# Patient Record
Sex: Female | Born: 1965 | Race: White | Hispanic: No | Smoking: Current some day smoker
Health system: Southern US, Community
[De-identification: ages and names within clinical notes are randomized; demographics above are authoritative.]

## PROBLEM LIST (undated history)

## (undated) ENCOUNTER — Telehealth

## (undated) ENCOUNTER — Encounter: Attending: Gastroenterology | Primary: Gastroenterology

## (undated) ENCOUNTER — Encounter

## (undated) ENCOUNTER — Ambulatory Visit

## (undated) ENCOUNTER — Encounter
Attending: Pharmacist Clinician (PhC)/ Clinical Pharmacy Specialist | Primary: Pharmacist Clinician (PhC)/ Clinical Pharmacy Specialist

## (undated) ENCOUNTER — Telehealth: Attending: Family | Primary: Family

## (undated) ENCOUNTER — Encounter: Attending: Gerontology | Primary: Gerontology

## (undated) ENCOUNTER — Ambulatory Visit: Payer: MEDICARE

## (undated) ENCOUNTER — Ambulatory Visit: Payer: MEDICARE | Attending: Family | Primary: Family

## (undated) ENCOUNTER — Encounter: Attending: Family | Primary: Family

## (undated) ENCOUNTER — Ambulatory Visit
Attending: Pharmacist Clinician (PhC)/ Clinical Pharmacy Specialist | Primary: Pharmacist Clinician (PhC)/ Clinical Pharmacy Specialist

## (undated) ENCOUNTER — Ambulatory Visit: Payer: MEDICARE | Attending: Gastroenterology | Primary: Gastroenterology

## (undated) ENCOUNTER — Telehealth
Attending: Pharmacist Clinician (PhC)/ Clinical Pharmacy Specialist | Primary: Pharmacist Clinician (PhC)/ Clinical Pharmacy Specialist

## (undated) DIAGNOSIS — F259 Schizoaffective disorder, unspecified: Secondary | ICD-10-CM

## (undated) DIAGNOSIS — M549 Dorsalgia, unspecified: Secondary | ICD-10-CM

## (undated) DIAGNOSIS — R51 Headache: Secondary | ICD-10-CM

## (undated) DIAGNOSIS — G8929 Other chronic pain: Secondary | ICD-10-CM

## (undated) DIAGNOSIS — F4310 Post-traumatic stress disorder, unspecified: Secondary | ICD-10-CM

## (undated) DIAGNOSIS — S22000A Wedge compression fracture of unspecified thoracic vertebra, initial encounter for closed fracture: Secondary | ICD-10-CM

## (undated) DIAGNOSIS — S92301A Fracture of unspecified metatarsal bone(s), right foot, initial encounter for closed fracture: Secondary | ICD-10-CM

## (undated) DIAGNOSIS — M1990 Unspecified osteoarthritis, unspecified site: Secondary | ICD-10-CM

## (undated) DIAGNOSIS — R519 Headache, unspecified: Secondary | ICD-10-CM

## (undated) DIAGNOSIS — F319 Bipolar disorder, unspecified: Secondary | ICD-10-CM

## (undated) DIAGNOSIS — K219 Gastro-esophageal reflux disease without esophagitis: Secondary | ICD-10-CM

## (undated) DIAGNOSIS — S92009A Unspecified fracture of unspecified calcaneus, initial encounter for closed fracture: Secondary | ICD-10-CM

## (undated) DIAGNOSIS — K759 Inflammatory liver disease, unspecified: Secondary | ICD-10-CM

## (undated) DIAGNOSIS — S32000A Wedge compression fracture of unspecified lumbar vertebra, initial encounter for closed fracture: Secondary | ICD-10-CM

## (undated) HISTORY — PX: HEMORRHOID SURGERY: SHX153

## (undated) HISTORY — PX: COLONOSCOPY: SHX174

## (undated) HISTORY — PX: TUBAL LIGATION: SHX77

---
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
## 2002-10-31 ENCOUNTER — Emergency Department (HOSPITAL_COMMUNITY): Admission: EM | Admit: 2002-10-31 | Discharge: 2002-10-31 | Payer: Self-pay | Admitting: Internal Medicine

## 2003-01-21 ENCOUNTER — Emergency Department (HOSPITAL_COMMUNITY): Admission: EM | Admit: 2003-01-21 | Discharge: 2003-01-22 | Payer: Self-pay | Admitting: Emergency Medicine

## 2003-01-21 ENCOUNTER — Encounter: Payer: Self-pay | Admitting: Emergency Medicine

## 2011-02-01 DIAGNOSIS — R079 Chest pain, unspecified: Secondary | ICD-10-CM

## 2012-08-16 ENCOUNTER — Emergency Department (HOSPITAL_COMMUNITY): Payer: Medicare Other

## 2012-08-16 ENCOUNTER — Emergency Department (HOSPITAL_COMMUNITY)
Admission: EM | Admit: 2012-08-16 | Discharge: 2012-08-16 | Disposition: A | Discharge status: Home / Self Care | Payer: Medicare Other | Attending: Emergency Medicine | Admitting: Emergency Medicine

## 2012-08-16 ENCOUNTER — Encounter (HOSPITAL_COMMUNITY): Payer: Self-pay | Admitting: *Deleted

## 2012-08-16 DIAGNOSIS — F319 Bipolar disorder, unspecified: Secondary | ICD-10-CM | POA: Insufficient documentation

## 2012-08-16 DIAGNOSIS — M542 Cervicalgia: Secondary | ICD-10-CM | POA: Insufficient documentation

## 2012-08-16 DIAGNOSIS — Z79899 Other long term (current) drug therapy: Secondary | ICD-10-CM | POA: Insufficient documentation

## 2012-08-16 DIAGNOSIS — T07XXXA Unspecified multiple injuries, initial encounter: Secondary | ICD-10-CM | POA: Insufficient documentation

## 2012-08-16 DIAGNOSIS — Z23 Encounter for immunization: Secondary | ICD-10-CM | POA: Insufficient documentation

## 2012-08-16 DIAGNOSIS — M25469 Effusion, unspecified knee: Secondary | ICD-10-CM | POA: Diagnosis not present

## 2012-08-16 DIAGNOSIS — M79609 Pain in unspecified limb: Secondary | ICD-10-CM | POA: Diagnosis not present

## 2012-08-16 DIAGNOSIS — R109 Unspecified abdominal pain: Secondary | ICD-10-CM | POA: Diagnosis not present

## 2012-08-16 DIAGNOSIS — M549 Dorsalgia, unspecified: Secondary | ICD-10-CM | POA: Diagnosis not present

## 2012-08-16 DIAGNOSIS — R51 Headache: Secondary | ICD-10-CM | POA: Diagnosis not present

## 2012-08-16 DIAGNOSIS — H1130 Conjunctival hemorrhage, unspecified eye: Secondary | ICD-10-CM | POA: Insufficient documentation

## 2012-08-16 DIAGNOSIS — N390 Urinary tract infection, site not specified: Secondary | ICD-10-CM | POA: Diagnosis not present

## 2012-08-16 DIAGNOSIS — Z043 Encounter for examination and observation following other accident: Secondary | ICD-10-CM | POA: Diagnosis not present

## 2012-08-16 DIAGNOSIS — S022XXA Fracture of nasal bones, initial encounter for closed fracture: Secondary | ICD-10-CM | POA: Insufficient documentation

## 2012-08-16 DIAGNOSIS — M25569 Pain in unspecified knee: Secondary | ICD-10-CM | POA: Insufficient documentation

## 2012-08-16 DIAGNOSIS — M25529 Pain in unspecified elbow: Secondary | ICD-10-CM | POA: Insufficient documentation

## 2012-08-16 HISTORY — DX: Bipolar disorder, unspecified: F31.9

## 2012-08-16 LAB — URINALYSIS, ROUTINE W REFLEX MICROSCOPIC
Glucose, UA: NEGATIVE mg/dL
Protein, ur: NEGATIVE mg/dL

## 2012-08-16 LAB — COMPREHENSIVE METABOLIC PANEL
ALT: 16 U/L (ref 0–35)
AST: 32 U/L (ref 0–37)
Alkaline Phosphatase: 62 U/L (ref 39–117)
CO2: 22 mEq/L (ref 19–32)
Calcium: 8.9 mg/dL (ref 8.4–10.5)
Chloride: 104 mEq/L (ref 96–112)
GFR calc non Af Amer: 69 mL/min — ABNORMAL LOW (ref >90)
Potassium: 3.4 mEq/L — ABNORMAL LOW (ref 3.5–5.1)
Sodium: 134 mEq/L — ABNORMAL LOW (ref 135–145)

## 2012-08-16 LAB — URINE MICROSCOPIC-ADD ON

## 2012-08-16 LAB — CBC WITH DIFFERENTIAL/PLATELET
Basophils Absolute: 0 10*3/uL (ref 0.0–0.1)
Eosinophils Relative: 1 % (ref 0–5)
Lymphocytes Relative: 20 % (ref 12–46)
Neutro Abs: 6.7 10*3/uL (ref 1.7–7.7)
Neutrophils Relative %: 67 % (ref 43–77)
Platelets: 245 10*3/uL (ref 150–400)
RBC: 4.33 MIL/uL (ref 3.87–5.11)
RDW: 13.7 % (ref 11.5–15.5)
WBC: 10 10*3/uL (ref 4.0–10.5)

## 2012-08-16 MED ORDER — HYDROCODONE-ACETAMINOPHEN 5-325 MG PO TABS
2.0000 | ORAL_TABLET | Freq: Once | ORAL | Status: AC
  Administered 2012-08-16: 2 via ORAL
  Filled 2012-08-16: qty 2

## 2012-08-16 MED ORDER — OXYCODONE HCL 5 MG PO TABS
5.0000 mg | ORAL_TABLET | Freq: Once | ORAL | Status: AC
  Administered 2012-08-16: 5 mg via ORAL
  Filled 2012-08-16: qty 1

## 2012-08-16 MED ORDER — TETANUS-DIPHTH-ACELL PERTUSSIS 5-2.5-18.5 LF-MCG/0.5 IM SUSP
0.5000 mL | Freq: Once | INTRAMUSCULAR | Status: AC
  Administered 2012-08-16: 0.5 mL via INTRAMUSCULAR
  Filled 2012-08-16: qty 0.5

## 2012-08-16 MED ORDER — IBUPROFEN 800 MG PO TABS: 800.0000 mg | ORAL_TABLET | Freq: Three times a day (TID) | ORAL | Status: AC

## 2012-08-16 MED ORDER — HYDROCODONE-ACETAMINOPHEN 5-325 MG PO TABS: 2.0000 | ORAL_TABLET | ORAL | Status: AC | PRN

## 2012-08-16 MED ORDER — IOHEXOL 300 MG/ML  SOLN
100.0000 mL | Freq: Once | INTRAMUSCULAR | Status: AC | PRN
  Administered 2012-08-16: 100 mL via INTRAVENOUS

## 2012-08-16 MED ORDER — CEPHALEXIN 500 MG PO CAPS: 500.0000 mg | ORAL_CAPSULE | Freq: Four times a day (QID) | ORAL | Status: AC

## 2012-08-16 NOTE — ED Notes (Signed)
Pt unable to make out any letters on the eye chart. Pt states she wears corrective lenses & is unable to make out anything without them. EDP notified.

## 2012-08-16 NOTE — ED Notes (Signed)
Pt alert & oriented x4. Patient given discharge instructions, paperwork & prescription(s). Patient Parent verbalized understanding. Pt left department w/ no further questions.

## 2012-08-16 NOTE — ED Notes (Signed)
RN at bedside
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
## 2012-08-21 ENCOUNTER — Ambulatory Visit (INDEPENDENT_AMBULATORY_CARE_PROVIDER_SITE_OTHER): Payer: Medicare Other | Admitting: Otolaryngology

## 2012-08-21 DIAGNOSIS — S022XXA Fracture of nasal bones, initial encounter for closed fracture: Secondary | ICD-10-CM

## 2012-08-21 DIAGNOSIS — H903 Sensorineural hearing loss, bilateral: Secondary | ICD-10-CM | POA: Diagnosis not present

## 2012-09-22 DIAGNOSIS — F3163 Bipolar disorder, current episode mixed, severe, without psychotic features: Secondary | ICD-10-CM | POA: Diagnosis not present

## 2012-10-02 DIAGNOSIS — Z23 Encounter for immunization: Secondary | ICD-10-CM | POA: Diagnosis not present

## 2012-12-08 DIAGNOSIS — F3163 Bipolar disorder, current episode mixed, severe, without psychotic features: Secondary | ICD-10-CM | POA: Diagnosis not present

## 2012-12-26 DIAGNOSIS — J111 Influenza due to unidentified influenza virus with other respiratory manifestations: Secondary | ICD-10-CM | POA: Diagnosis not present

## 2012-12-26 DIAGNOSIS — F17200 Nicotine dependence, unspecified, uncomplicated: Secondary | ICD-10-CM | POA: Diagnosis not present

## 2012-12-26 DIAGNOSIS — Z79899 Other long term (current) drug therapy: Secondary | ICD-10-CM | POA: Diagnosis not present

## 2013-04-02 DIAGNOSIS — F3163 Bipolar disorder, current episode mixed, severe, without psychotic features: Secondary | ICD-10-CM | POA: Diagnosis not present

## 2013-04-07 DIAGNOSIS — Z113 Encounter for screening for infections with a predominantly sexual mode of transmission: Secondary | ICD-10-CM | POA: Diagnosis not present

## 2013-04-07 DIAGNOSIS — A6000 Herpesviral infection of urogenital system, unspecified: Secondary | ICD-10-CM | POA: Diagnosis not present

## 2013-04-07 DIAGNOSIS — R21 Rash and other nonspecific skin eruption: Secondary | ICD-10-CM | POA: Diagnosis not present

## 2013-04-07 DIAGNOSIS — N949 Unspecified condition associated with female genital organs and menstrual cycle: Secondary | ICD-10-CM | POA: Diagnosis not present

## 2013-05-21 DIAGNOSIS — J329 Chronic sinusitis, unspecified: Secondary | ICD-10-CM | POA: Diagnosis not present

## 2013-05-21 DIAGNOSIS — J209 Acute bronchitis, unspecified: Secondary | ICD-10-CM | POA: Diagnosis not present

## 2013-05-21 DIAGNOSIS — Z79899 Other long term (current) drug therapy: Secondary | ICD-10-CM | POA: Diagnosis not present

## 2013-05-21 DIAGNOSIS — J40 Bronchitis, not specified as acute or chronic: Secondary | ICD-10-CM | POA: Diagnosis not present

## 2013-05-21 DIAGNOSIS — J019 Acute sinusitis, unspecified: Secondary | ICD-10-CM | POA: Diagnosis not present

## 2013-05-21 DIAGNOSIS — F17200 Nicotine dependence, unspecified, uncomplicated: Secondary | ICD-10-CM | POA: Diagnosis not present

## 2013-05-21 DIAGNOSIS — J069 Acute upper respiratory infection, unspecified: Secondary | ICD-10-CM | POA: Diagnosis not present

## 2013-06-17 DIAGNOSIS — F3163 Bipolar disorder, current episode mixed, severe, without psychotic features: Secondary | ICD-10-CM | POA: Diagnosis not present

## 2013-10-06 DIAGNOSIS — Z23 Encounter for immunization: Secondary | ICD-10-CM | POA: Diagnosis not present

## 2013-10-06 DIAGNOSIS — F319 Bipolar disorder, unspecified: Secondary | ICD-10-CM | POA: Diagnosis not present

## 2013-10-20 DIAGNOSIS — Z23 Encounter for immunization: Secondary | ICD-10-CM | POA: Diagnosis not present

## 2013-10-30 ENCOUNTER — Encounter (HOSPITAL_COMMUNITY): Payer: Self-pay | Admitting: Emergency Medicine

## 2013-10-30 ENCOUNTER — Emergency Department (HOSPITAL_COMMUNITY)
Admission: EM | Admit: 2013-10-30 | Discharge: 2013-10-30 | Disposition: A | Discharge status: Home / Self Care | Payer: Medicare Other | Attending: Emergency Medicine | Admitting: Emergency Medicine

## 2013-10-30 ENCOUNTER — Emergency Department (HOSPITAL_COMMUNITY): Payer: Medicare Other

## 2013-10-30 DIAGNOSIS — K2970 Gastritis, unspecified, without bleeding: Secondary | ICD-10-CM | POA: Diagnosis not present

## 2013-10-30 DIAGNOSIS — Z79899 Other long term (current) drug therapy: Secondary | ICD-10-CM | POA: Insufficient documentation

## 2013-10-30 DIAGNOSIS — R209 Unspecified disturbances of skin sensation: Secondary | ICD-10-CM | POA: Diagnosis not present

## 2013-10-30 DIAGNOSIS — Z87891 Personal history of nicotine dependence: Secondary | ICD-10-CM | POA: Diagnosis not present

## 2013-10-30 DIAGNOSIS — M545 Low back pain, unspecified: Secondary | ICD-10-CM | POA: Insufficient documentation

## 2013-10-30 DIAGNOSIS — F319 Bipolar disorder, unspecified: Secondary | ICD-10-CM | POA: Insufficient documentation

## 2013-10-30 DIAGNOSIS — F259 Schizoaffective disorder, unspecified: Secondary | ICD-10-CM | POA: Insufficient documentation

## 2013-10-30 DIAGNOSIS — G8929 Other chronic pain: Secondary | ICD-10-CM | POA: Diagnosis not present

## 2013-10-30 DIAGNOSIS — R10819 Abdominal tenderness, unspecified site: Secondary | ICD-10-CM | POA: Diagnosis not present

## 2013-10-30 DIAGNOSIS — M549 Dorsalgia, unspecified: Secondary | ICD-10-CM

## 2013-10-30 DIAGNOSIS — M47817 Spondylosis without myelopathy or radiculopathy, lumbosacral region: Secondary | ICD-10-CM | POA: Diagnosis not present

## 2013-10-30 HISTORY — DX: Schizoaffective disorder, unspecified: F25.9

## 2013-10-30 HISTORY — DX: Post-traumatic stress disorder, unspecified: F43.10

## 2013-10-30 LAB — URINALYSIS, ROUTINE W REFLEX MICROSCOPIC
Ketones, ur: NEGATIVE mg/dL
Leukocytes, UA: NEGATIVE
Nitrite: NEGATIVE
Protein, ur: NEGATIVE mg/dL
pH: 7 (ref 5.0–8.0)

## 2013-10-30 MED ORDER — METHYLPREDNISOLONE SODIUM SUCC 125 MG IJ SOLR
125.0000 mg | Freq: Once | INTRAMUSCULAR | Status: AC
  Administered 2013-10-30: 125 mg via INTRAVENOUS
  Filled 2013-10-30: qty 2

## 2013-10-30 MED ORDER — ONDANSETRON HCL 4 MG/2ML IJ SOLN
4.0000 mg | Freq: Once | INTRAMUSCULAR | Status: AC
  Administered 2013-10-30: 4 mg via INTRAVENOUS
  Filled 2013-10-30: qty 2

## 2013-10-30 MED ORDER — PREDNISONE 20 MG PO TABS: ORAL_TABLET | ORAL | Status: DC

## 2013-10-30 MED ORDER — HYDROMORPHONE HCL PF 1 MG/ML IJ SOLN
1.0000 mg | Freq: Once | INTRAMUSCULAR | Status: AC
  Administered 2013-10-30: 1 mg via INTRAVENOUS
  Filled 2013-10-30: qty 1

## 2013-10-30 MED ORDER — HYDROCODONE-ACETAMINOPHEN 5-325 MG PO TABS: 1.0000 | ORAL_TABLET | Freq: Four times a day (QID) | ORAL | Status: DC | PRN

## 2013-10-30 NOTE — ED Notes (Signed)
Pt complain of low back pain and leg pain for a year. States she is having a hard time walking

## 2013-10-30 NOTE — ED Provider Notes (Signed)
CSN: [id number]     Arrival date & time 10/30/13  1232 History  This chart was scribed for Benny Lennert, MD by Dorothey Baseman, ED Scribe. This patient was seen in room APA08/[facility]08 and the patient's care was started at 3:30 PM.    Chief Complaint  Patient presents with  . Back Pain   Patient is a 47 y.o. female presenting with back pain. The history is provided by the patient. No language interpreter was used.  Back Pain Location:  Lumbar spine Radiates to:  L posterior upper leg, L thigh and L knee Pain severity:  Moderate Timing:  Intermittent Chronicity:  Chronic Relieved by:  None tried Worsened by:  Lying down and movement Ineffective treatments:  None tried Associated symptoms: tingling   Associated symptoms: no abdominal pain, no chest pain and no headaches    HPI Comments: Lauren Fuentes is a 47 y.o. female who presents to the Emergency Department complaining of an intermittent back pain that radiates to the left hip and down the bilateral legs that is chronic in nature, but she states she has not been seen for it before. She states the pain is exacerbated with laying down and standing up. She denies taking any medications at home to treat her symptoms. Patient reports an associated burning, paresthesia to the bottom of the bilateral feet. Patient reports a history of bipolar 1 disorder, schizoaffective disorder, and PTSD.  Past Medical History  Diagnosis Date  . Bipolar 1 disorder   . Schizoaffective disorder   . Post traumatic stress disorder (PTSD)    History reviewed. No pertinent past surgical history. No family history on file. History  Substance Use Topics  . Smoking status: Former Games developer  . Smokeless tobacco: Not on file  . Alcohol Use: No   OB History   Grav Para Term Preterm Abortions TAB SAB Ect Mult Living                 Review of Systems  Constitutional: Negative for appetite change and fatigue.  HENT: Negative for congestion, ear discharge and sinus  pressure.   Eyes: Negative for discharge.  Respiratory: Negative for cough.   Cardiovascular: Negative for chest pain.  Gastrointestinal: Negative for abdominal pain and diarrhea.  Genitourinary: Negative for frequency and hematuria.  Musculoskeletal: Positive for arthralgias, back pain and myalgias.  Skin: Negative for rash.  Neurological: Positive for tingling. Negative for seizures and headaches.  Psychiatric/Behavioral: Negative for hallucinations.    Allergies  Review of patient's allergies indicates no known allergies.  Home Medications   Current Outpatient Rx  Name  Route  Sig  Dispense  Refill  . ALPRAZolam (XANAX) 1 MG tablet   Oral   Take 1 mg by mouth 3 (three) times daily.         Marland Kitchen FLUoxetine (PROZAC) 20 MG capsule   Oral   Take 40 mg by mouth daily.         . fluticasone (FLONASE) 50 MCG/ACT nasal spray   Nasal   Place 2 sprays into the nose daily.         Marland Kitchen lamoTRIgine (LAMICTAL) 100 MG tablet   Oral   Take 100 mg by mouth 2 (two) times daily.         Marland Kitchen loxapine (LOXITANE) 5 MG capsule   Oral   Take 5 mg by mouth at bedtime.         . traZODone (DESYREL) 150 MG tablet   Oral  Take 300 mg by mouth at bedtime.          Triage Vitals: BP 136/84  Pulse 75  Temp(Src) 97.6 F (36.4 C) (Oral)  Resp 18  Ht 5\' 5"  (1.651 m)  Wt 150 lb (68.04 kg)  BMI 24.96 kg/m2  SpO2 100%  Physical Exam  Constitutional: She is oriented to person, place, and time. She appears well-developed.  HENT:  Head: Normocephalic.  Eyes: Conjunctivae and EOM are normal. No scleral icterus.  Neck: Neck supple. No thyromegaly present.  Pulmonary/Chest: Effort normal. No stridor. No respiratory distress.  Abdominal: She exhibits no distension. There is no tenderness. There is no rebound.  Musculoskeletal: Normal range of motion. She exhibits no edema.  Moderate L spine tenderness to palpation. Negative straight-leg raise bilaterally.   Lymphadenopathy:    She has  no cervical adenopathy.  Neurological: She is oriented to person, place, and time. She exhibits normal muscle tone. Coordination normal.  Skin: No rash noted. No erythema.  Psychiatric: She has a normal mood and affect. Her behavior is normal.    ED Course  Procedures (including critical care time)  DIAGNOSTIC STUDIES: Oxygen Saturation is 100% on room air, normal by my interpretation.    COORDINATION OF CARE: 3:33 PM- Will order x-rays of the L spine, Dilaudid, Zofran, and Solu-medrol. Discussed treatment plan with patient at bedside and patient verbalized agreement.   4:45 PM- Upon recheck, patient reports feeling a little better after receiving the medications. Discussed normal UA and that x-ray results indicate some degenerative changes and that symptoms are likely due to a pinched nerve. Will discharge patient with prednisone and pain medication. Advised patient to follow up with her PCP in about a week, especially if symptoms do not improve. Discussed treatment plan with patient at bedside and patient verbalized agreement.    Labs Review Labs Reviewed  URINALYSIS, ROUTINE W REFLEX MICROSCOPIC    Imaging Review Dg Lumbar Spine Complete  10/30/2013   CLINICAL DATA:  Low back pain and tingling. Burning sensation down the legs. No known injury.  EXAM: LUMBAR SPINE - COMPLETE 4+ VIEW  COMPARISON:  CT, 08/16/2012  FINDINGS: No fracture or spondylolisthesis.  Minor loss of disc height at L3-L4 with mild loss of disk height at L4-L5. Remaining lumbar disks are well preserved. Facet joints are well preserved. The surrounding soft tissues are unremarkable.  IMPRESSION: No fracture or acute finding.  Mild degenerative changes as described   Electronically Signed   By: Amie Portland M.D.   On: 10/30/2013 16:25    EKG Interpretation   None       MDM  No diagnosis found.   Davonna Belling The chart was scribed for me under my direct supervision.  I personally performed the history, physical, and  medical decision making and all procedures in the evaluation of this patient.Benny Lennert, MD 10/30/13 [phone]

## 2013-10-30 NOTE — ED Notes (Signed)
Pt requesting more pain medication.

## 2014-02-17 DIAGNOSIS — F319 Bipolar disorder, unspecified: Secondary | ICD-10-CM | POA: Diagnosis not present

## 2014-05-10 ENCOUNTER — Telehealth: Payer: Self-pay | Admitting: Family Medicine

## 2014-05-11 ENCOUNTER — Encounter (INDEPENDENT_AMBULATORY_CARE_PROVIDER_SITE_OTHER): Payer: Self-pay

## 2014-05-11 ENCOUNTER — Ambulatory Visit (INDEPENDENT_AMBULATORY_CARE_PROVIDER_SITE_OTHER): Payer: Medicare Other | Admitting: Family Medicine

## 2014-05-11 ENCOUNTER — Ambulatory Visit (INDEPENDENT_AMBULATORY_CARE_PROVIDER_SITE_OTHER): Payer: Medicare Other

## 2014-05-11 ENCOUNTER — Encounter: Payer: Self-pay | Admitting: Family Medicine

## 2014-05-11 VITALS — BP 124/66 | HR 80 | Temp 97.0°F | Ht 64.5 in | Wt 148.0 lb

## 2014-05-11 DIAGNOSIS — M549 Dorsalgia, unspecified: Secondary | ICD-10-CM

## 2014-05-11 MED ORDER — NAPROXEN 500 MG PO TABS: 500.0000 mg | ORAL_TABLET | Freq: Two times a day (BID) | ORAL | Status: DC

## 2014-05-11 MED ORDER — CYCLOBENZAPRINE HCL 10 MG PO TABS: 10.0000 mg | ORAL_TABLET | Freq: Three times a day (TID) | ORAL | Status: DC | PRN

## 2014-05-11 NOTE — Telephone Encounter (Signed)
appt given for today with bill 

## 2014-05-11 NOTE — Progress Notes (Signed)
   Subjective:    Patient ID: Lauren Fuentes, female    DOB: 03-30-1966, 48 y.o.   MRN: [id number]  HPI This 47 y.o. female presents for evaluation of back pain.  She states her back hurts in her lumbar area. She states she was seen at [facility] ED almost a year ago and she was told she has a pinched nerve because a vertebrae is pinching her nerve against another vertebrae.  She states she has not been seen since last year in August for this so she wanted to get established here today and get her Back pain under control.  She states she is in severe pain.  She states she did have MRI.  Records  Show she had LS spine xray which showed minimal arthritic changes.   She denies being tx for Her back pain since.   Review of Systems C/o back pain No chest pain, SOB, HA, dizziness, vision change, N/V, diarrhea, constipation, dysuria, urinary urgency or frequency or rash.     Objective:   Physical Exam  Vital signs noted  Well developed well nourished female in NAD.  MS - TTP LS paraspinous muscles Negative SLR bilateral.  Walks on heels and tippy toes w/o difficulty.  Xray of LS spine - No fracture Prelimnary reading by [place]:  Back pain - Plan: DG Lumbar Spine 2-3 Views, naproxen (NAPROSYN) 500 MG tablet, cyclobenzaprine (FLEXERIL) 10 MG tablet  Reviewed Xrays with patient and informed her nothing significantly wrong with xrays.  Follow up in one week if not better.  Lysbeth Penner FNP

## 2014-05-12 ENCOUNTER — Encounter (HOSPITAL_COMMUNITY): Payer: Self-pay | Admitting: Emergency Medicine

## 2014-05-31 DIAGNOSIS — F319 Bipolar disorder, unspecified: Secondary | ICD-10-CM | POA: Diagnosis not present

## 2014-06-09 ENCOUNTER — Telehealth: Payer: Self-pay | Admitting: Family Medicine

## 2014-06-09 NOTE — Telephone Encounter (Signed)
Appt scheduled

## 2014-06-16 ENCOUNTER — Ambulatory Visit: Payer: Medicare Other | Admitting: Family Medicine

## 2014-06-16 ENCOUNTER — Encounter (INDEPENDENT_AMBULATORY_CARE_PROVIDER_SITE_OTHER): Payer: Self-pay

## 2014-06-24 DIAGNOSIS — M549 Dorsalgia, unspecified: Secondary | ICD-10-CM | POA: Diagnosis not present

## 2014-06-24 DIAGNOSIS — J301 Allergic rhinitis due to pollen: Secondary | ICD-10-CM | POA: Diagnosis not present

## 2014-06-24 DIAGNOSIS — F3111 Bipolar disorder, current episode manic without psychotic features, mild: Secondary | ICD-10-CM | POA: Diagnosis not present

## 2014-07-07 ENCOUNTER — Ambulatory Visit: Payer: Medicare Other | Admitting: Family Medicine

## 2014-07-30 DIAGNOSIS — M545 Low back pain, unspecified: Secondary | ICD-10-CM | POA: Diagnosis not present

## 2014-07-30 DIAGNOSIS — Z8659 Personal history of other mental and behavioral disorders: Secondary | ICD-10-CM | POA: Diagnosis not present

## 2014-07-30 DIAGNOSIS — F319 Bipolar disorder, unspecified: Secondary | ICD-10-CM | POA: Diagnosis not present

## 2014-07-30 DIAGNOSIS — G8929 Other chronic pain: Secondary | ICD-10-CM | POA: Diagnosis not present

## 2014-07-30 DIAGNOSIS — Z79899 Other long term (current) drug therapy: Secondary | ICD-10-CM | POA: Diagnosis not present

## 2014-08-19 DIAGNOSIS — Z23 Encounter for immunization: Secondary | ICD-10-CM | POA: Diagnosis not present

## 2014-08-24 DIAGNOSIS — Q762 Congenital spondylolisthesis: Secondary | ICD-10-CM | POA: Diagnosis not present

## 2014-08-24 DIAGNOSIS — M5137 Other intervertebral disc degeneration, lumbosacral region: Secondary | ICD-10-CM | POA: Diagnosis not present

## 2014-08-24 DIAGNOSIS — M47817 Spondylosis without myelopathy or radiculopathy, lumbosacral region: Secondary | ICD-10-CM | POA: Diagnosis not present

## 2014-08-25 DIAGNOSIS — F319 Bipolar disorder, unspecified: Secondary | ICD-10-CM | POA: Diagnosis not present

## 2014-10-12 DIAGNOSIS — M25552 Pain in left hip: Secondary | ICD-10-CM | POA: Diagnosis not present

## 2014-10-12 DIAGNOSIS — M47896 Other spondylosis, lumbar region: Secondary | ICD-10-CM | POA: Diagnosis not present

## 2014-10-12 DIAGNOSIS — M25652 Stiffness of left hip, not elsewhere classified: Secondary | ICD-10-CM | POA: Diagnosis not present

## 2014-10-12 DIAGNOSIS — M545 Low back pain: Secondary | ICD-10-CM | POA: Diagnosis not present

## 2014-10-13 DIAGNOSIS — M25652 Stiffness of left hip, not elsewhere classified: Secondary | ICD-10-CM | POA: Diagnosis not present

## 2014-10-13 DIAGNOSIS — M25552 Pain in left hip: Secondary | ICD-10-CM | POA: Diagnosis not present

## 2014-10-13 DIAGNOSIS — M47896 Other spondylosis, lumbar region: Secondary | ICD-10-CM | POA: Diagnosis not present

## 2014-10-13 DIAGNOSIS — M545 Low back pain: Secondary | ICD-10-CM | POA: Diagnosis not present

## 2014-10-19 ENCOUNTER — Emergency Department (HOSPITAL_COMMUNITY): Payer: Medicare Other

## 2014-10-19 ENCOUNTER — Encounter (HOSPITAL_COMMUNITY): Payer: Self-pay | Admitting: Emergency Medicine

## 2014-10-19 ENCOUNTER — Inpatient Hospital Stay (HOSPITAL_COMMUNITY): Payer: Medicare Other

## 2014-10-19 ENCOUNTER — Inpatient Hospital Stay (HOSPITAL_COMMUNITY)
Admission: EM | Admit: 2014-10-19 | Discharge: 2014-10-25 | DRG: 504 | Disposition: A | Discharge status: Home / Self Care | Payer: Medicare Other | Attending: Orthopedic Surgery | Admitting: Orthopedic Surgery

## 2014-10-19 DIAGNOSIS — S92002A Unspecified fracture of left calcaneus, initial encounter for closed fracture: Secondary | ICD-10-CM | POA: Diagnosis not present

## 2014-10-19 DIAGNOSIS — M545 Low back pain: Secondary | ICD-10-CM | POA: Diagnosis not present

## 2014-10-19 DIAGNOSIS — T148 Other injury of unspecified body region: Secondary | ICD-10-CM | POA: Diagnosis not present

## 2014-10-19 DIAGNOSIS — W134XXA Fall from, out of or through window, initial encounter: Secondary | ICD-10-CM | POA: Diagnosis present

## 2014-10-19 DIAGNOSIS — F4310 Post-traumatic stress disorder, unspecified: Secondary | ICD-10-CM | POA: Diagnosis present

## 2014-10-19 DIAGNOSIS — S22000A Wedge compression fracture of unspecified thoracic vertebra, initial encounter for closed fracture: Secondary | ICD-10-CM | POA: Diagnosis present

## 2014-10-19 DIAGNOSIS — F259 Schizoaffective disorder, unspecified: Secondary | ICD-10-CM | POA: Diagnosis present

## 2014-10-19 DIAGNOSIS — S0990XA Unspecified injury of head, initial encounter: Secondary | ICD-10-CM | POA: Diagnosis not present

## 2014-10-19 DIAGNOSIS — T148XXA Other injury of unspecified body region, initial encounter: Secondary | ICD-10-CM

## 2014-10-19 DIAGNOSIS — F209 Schizophrenia, unspecified: Secondary | ICD-10-CM | POA: Diagnosis not present

## 2014-10-19 DIAGNOSIS — S92009A Unspecified fracture of unspecified calcaneus, initial encounter for closed fracture: Secondary | ICD-10-CM

## 2014-10-19 DIAGNOSIS — F339 Major depressive disorder, recurrent, unspecified: Secondary | ICD-10-CM | POA: Diagnosis not present

## 2014-10-19 DIAGNOSIS — F319 Bipolar disorder, unspecified: Secondary | ICD-10-CM | POA: Diagnosis present

## 2014-10-19 DIAGNOSIS — W130XXA Fall from, out of or through balcony, initial encounter: Secondary | ICD-10-CM | POA: Diagnosis not present

## 2014-10-19 DIAGNOSIS — N9489 Other specified conditions associated with female genital organs and menstrual cycle: Secondary | ICD-10-CM

## 2014-10-19 DIAGNOSIS — M5116 Intervertebral disc disorders with radiculopathy, lumbar region: Secondary | ICD-10-CM | POA: Diagnosis not present

## 2014-10-19 DIAGNOSIS — T1490 Injury, unspecified: Secondary | ICD-10-CM | POA: Diagnosis not present

## 2014-10-19 DIAGNOSIS — S329XXA Fracture of unspecified parts of lumbosacral spine and pelvis, initial encounter for closed fracture: Secondary | ICD-10-CM | POA: Diagnosis not present

## 2014-10-19 DIAGNOSIS — M79604 Pain in right leg: Secondary | ICD-10-CM | POA: Diagnosis not present

## 2014-10-19 DIAGNOSIS — S32029A Unspecified fracture of second lumbar vertebra, initial encounter for closed fracture: Secondary | ICD-10-CM | POA: Diagnosis not present

## 2014-10-19 DIAGNOSIS — W139XXA Fall from, out of or through building, not otherwise specified, initial encounter: Secondary | ICD-10-CM | POA: Diagnosis present

## 2014-10-19 DIAGNOSIS — S22080A Wedge compression fracture of T11-T12 vertebra, initial encounter for closed fracture: Secondary | ICD-10-CM | POA: Diagnosis not present

## 2014-10-19 DIAGNOSIS — S199XXA Unspecified injury of neck, initial encounter: Secondary | ICD-10-CM | POA: Diagnosis not present

## 2014-10-19 DIAGNOSIS — R262 Difficulty in walking, not elsewhere classified: Secondary | ICD-10-CM | POA: Diagnosis not present

## 2014-10-19 DIAGNOSIS — R222 Localized swelling, mass and lump, trunk: Secondary | ICD-10-CM | POA: Diagnosis not present

## 2014-10-19 DIAGNOSIS — M549 Dorsalgia, unspecified: Secondary | ICD-10-CM | POA: Diagnosis not present

## 2014-10-19 DIAGNOSIS — T1490XA Injury, unspecified, initial encounter: Secondary | ICD-10-CM

## 2014-10-19 DIAGNOSIS — M79605 Pain in left leg: Secondary | ICD-10-CM | POA: Diagnosis not present

## 2014-10-19 DIAGNOSIS — S92341A Displaced fracture of fourth metatarsal bone, right foot, initial encounter for closed fracture: Secondary | ICD-10-CM | POA: Diagnosis present

## 2014-10-19 DIAGNOSIS — M47816 Spondylosis without myelopathy or radiculopathy, lumbar region: Secondary | ICD-10-CM | POA: Diagnosis not present

## 2014-10-19 DIAGNOSIS — N949 Unspecified condition associated with female genital organs and menstrual cycle: Secondary | ICD-10-CM | POA: Diagnosis not present

## 2014-10-19 DIAGNOSIS — N830 Follicular cyst of ovary: Secondary | ICD-10-CM | POA: Diagnosis not present

## 2014-10-19 DIAGNOSIS — S92045A Nondisplaced other fracture of tuberosity of left calcaneus, initial encounter for closed fracture: Secondary | ICD-10-CM | POA: Diagnosis not present

## 2014-10-19 DIAGNOSIS — M799 Soft tissue disorder, unspecified: Secondary | ICD-10-CM | POA: Diagnosis not present

## 2014-10-19 DIAGNOSIS — S3981XA Other specified injuries of abdomen, initial encounter: Secondary | ICD-10-CM | POA: Diagnosis not present

## 2014-10-19 DIAGNOSIS — S22088A Other fracture of T11-T12 vertebra, initial encounter for closed fracture: Secondary | ICD-10-CM | POA: Diagnosis present

## 2014-10-19 DIAGNOSIS — S99921A Unspecified injury of right foot, initial encounter: Secondary | ICD-10-CM | POA: Diagnosis not present

## 2014-10-19 DIAGNOSIS — M6281 Muscle weakness (generalized): Secondary | ICD-10-CM | POA: Diagnosis not present

## 2014-10-19 DIAGNOSIS — S92002D Unspecified fracture of left calcaneus, subsequent encounter for fracture with routine healing: Secondary | ICD-10-CM | POA: Diagnosis not present

## 2014-10-19 DIAGNOSIS — S92301A Fracture of unspecified metatarsal bone(s), right foot, initial encounter for closed fracture: Secondary | ICD-10-CM | POA: Diagnosis present

## 2014-10-19 DIAGNOSIS — S32018A Other fracture of first lumbar vertebra, initial encounter for closed fracture: Secondary | ICD-10-CM | POA: Diagnosis not present

## 2014-10-19 DIAGNOSIS — S299XXA Unspecified injury of thorax, initial encounter: Secondary | ICD-10-CM | POA: Diagnosis not present

## 2014-10-19 DIAGNOSIS — Y92009 Unspecified place in unspecified non-institutional (private) residence as the place of occurrence of the external cause: Secondary | ICD-10-CM | POA: Diagnosis not present

## 2014-10-19 DIAGNOSIS — S92065A Nondisplaced intraarticular fracture of left calcaneus, initial encounter for closed fracture: Secondary | ICD-10-CM | POA: Diagnosis not present

## 2014-10-19 DIAGNOSIS — S8992XA Unspecified injury of left lower leg, initial encounter: Secondary | ICD-10-CM | POA: Diagnosis not present

## 2014-10-19 DIAGNOSIS — S32028A Other fracture of second lumbar vertebra, initial encounter for closed fracture: Secondary | ICD-10-CM | POA: Diagnosis present

## 2014-10-19 DIAGNOSIS — S99811A Other specified injuries of right ankle, initial encounter: Secondary | ICD-10-CM | POA: Diagnosis not present

## 2014-10-19 DIAGNOSIS — M79606 Pain in leg, unspecified: Secondary | ICD-10-CM | POA: Diagnosis not present

## 2014-10-19 DIAGNOSIS — S8991XA Unspecified injury of right lower leg, initial encounter: Secondary | ICD-10-CM | POA: Diagnosis not present

## 2014-10-19 DIAGNOSIS — S32000A Wedge compression fracture of unspecified lumbar vertebra, initial encounter for closed fracture: Secondary | ICD-10-CM | POA: Diagnosis present

## 2014-10-19 DIAGNOSIS — S3983XA Other specified injuries of pelvis, initial encounter: Secondary | ICD-10-CM | POA: Diagnosis not present

## 2014-10-19 DIAGNOSIS — S92002B Unspecified fracture of left calcaneus, initial encounter for open fracture: Secondary | ICD-10-CM | POA: Diagnosis not present

## 2014-10-19 DIAGNOSIS — S92909A Unspecified fracture of unspecified foot, initial encounter for closed fracture: Secondary | ICD-10-CM | POA: Diagnosis not present

## 2014-10-19 HISTORY — DX: Unspecified fracture of unspecified calcaneus, initial encounter for closed fracture: S92.009A

## 2014-10-19 HISTORY — DX: Wedge compression fracture of unspecified lumbar vertebra, initial encounter for closed fracture: S32.000A

## 2014-10-19 HISTORY — DX: Wedge compression fracture of unspecified thoracic vertebra, initial encounter for closed fracture: S22.000A

## 2014-10-19 HISTORY — DX: Fracture of unspecified metatarsal bone(s), right foot, initial encounter for closed fracture: S92.301A

## 2014-10-19 LAB — URINALYSIS, ROUTINE W REFLEX MICROSCOPIC
BILIRUBIN URINE: NEGATIVE
Glucose, UA: NEGATIVE mg/dL
Hgb urine dipstick: NEGATIVE
KETONES UR: NEGATIVE mg/dL
Leukocytes, UA: NEGATIVE
NITRITE: NEGATIVE
Protein, ur: NEGATIVE mg/dL
Specific Gravity, Urine: 1.011 (ref 1.005–1.030)
Urobilinogen, UA: 0.2 mg/dL (ref 0.0–1.0)
pH: 7.5 (ref 5.0–8.0)

## 2014-10-19 LAB — I-STAT BETA HCG BLOOD, ED (MC, WL, AP ONLY): I-stat hCG, quantitative: <5 m[IU]/mL (ref <5)

## 2014-10-19 LAB — CBC WITH DIFFERENTIAL/PLATELET
Basophils Absolute: 0 10*3/uL (ref 0.0–0.1)
Basophils Relative: 0 % (ref 0–1)
Eosinophils Absolute: 0 10*3/uL (ref 0.0–0.7)
Eosinophils Relative: 0 % (ref 0–5)
HCT: 41.2 % (ref 36.0–46.0)
Hemoglobin: 13.9 g/dL (ref 12.0–15.0)
Lymphocytes Relative: 25 % (ref 12–46)
Lymphs Abs: 2.1 10*3/uL (ref 0.7–4.0)
MCH: 31 pg (ref 26.0–34.0)
MCHC: 33.7 g/dL (ref 30.0–36.0)
MCV: 92 fL (ref 78.0–100.0)
Monocytes Absolute: 0.6 10*3/uL (ref 0.1–1.0)
Monocytes Relative: 7 % (ref 3–12)
NEUTROS ABS: 5.9 10*3/uL (ref 1.7–7.7)
NEUTROS PCT: 68 % (ref 43–77)
Platelets: 288 10*3/uL (ref 150–400)
RBC: 4.48 MIL/uL (ref 3.87–5.11)
RDW: 13.8 % (ref 11.5–15.5)
WBC: 8.6 10*3/uL (ref 4.0–10.5)

## 2014-10-19 LAB — I-STAT CHEM 8, ED
BUN: 6 mg/dL (ref 6–23)
CHLORIDE: 108 meq/L (ref 96–112)
CREATININE: 1 mg/dL (ref 0.50–1.10)
Calcium, Ion: 1.17 mmol/L (ref 1.12–1.23)
Glucose, Bld: 88 mg/dL (ref 70–99)
HCT: 43 % (ref 36.0–46.0)
Hemoglobin: 14.6 g/dL (ref 12.0–15.0)
POTASSIUM: 3.9 meq/L (ref 3.7–5.3)
SODIUM: 144 meq/L (ref 137–147)
TCO2: 24 mmol/L (ref 0–100)

## 2014-10-19 LAB — COMPREHENSIVE METABOLIC PANEL
ALT: 47 U/L — ABNORMAL HIGH (ref 0–35)
ANION GAP: 14 (ref 5–15)
AST: 47 U/L — ABNORMAL HIGH (ref 0–37)
Albumin: 3.4 g/dL — ABNORMAL LOW (ref 3.5–5.2)
Alkaline Phosphatase: 52 U/L (ref 39–117)
BILIRUBIN TOTAL: 0.3 mg/dL (ref 0.3–1.2)
BUN: 8 mg/dL (ref 6–23)
CO2: 24 meq/L (ref 19–32)
Calcium: 9 mg/dL (ref 8.4–10.5)
Chloride: 107 mEq/L (ref 96–112)
Creatinine, Ser: 0.95 mg/dL (ref 0.50–1.10)
GFR calc Af Amer: 80 mL/min — ABNORMAL LOW (ref >90)
GFR calc non Af Amer: 69 mL/min — ABNORMAL LOW (ref >90)
Glucose, Bld: 86 mg/dL (ref 70–99)
Potassium: 4.2 mEq/L (ref 3.7–5.3)
Sodium: 145 mEq/L (ref 137–147)
Total Protein: 6.8 g/dL (ref 6.0–8.3)

## 2014-10-19 LAB — RAPID URINE DRUG SCREEN, HOSP PERFORMED
Amphetamines: NOT DETECTED
Barbiturates: NOT DETECTED
Benzodiazepines: NOT DETECTED
COCAINE: POSITIVE — AB
OPIATES: POSITIVE — AB
Tetrahydrocannabinol: NOT DETECTED

## 2014-10-19 LAB — PROTIME-INR
INR: 1.03 (ref 0.00–1.49)
Prothrombin Time: 13.6 seconds (ref 11.6–15.2)

## 2014-10-19 LAB — SAMPLE TO BLOOD BANK

## 2014-10-19 LAB — ETHANOL: Alcohol, Ethyl (B): 109 mg/dL — ABNORMAL HIGH (ref 0–11)

## 2014-10-19 MED ORDER — BUSPIRONE HCL 15 MG PO TABS: 7.5000 mg | ORAL_TABLET | Freq: Three times a day (TID) | ORAL | Status: DC

## 2014-10-19 MED ORDER — SODIUM CHLORIDE 0.9 % IV SOLN
INTRAVENOUS | Status: DC
  Administered 2014-10-19 – 2014-10-21 (×6): via INTRAVENOUS

## 2014-10-19 MED ORDER — VALACYCLOVIR HCL 500 MG PO TABS
1000.0000 mg | ORAL_TABLET | Freq: Two times a day (BID) | ORAL | Status: DC
  Administered 2014-10-19 – 2014-10-25 (×11): 1000 mg via ORAL
  Filled 2014-10-19 (×13): qty 2

## 2014-10-19 MED ORDER — LOXAPINE SUCCINATE 5 MG PO CAPS
5.0000 mg | ORAL_CAPSULE | Freq: Every day | ORAL | Status: DC
  Administered 2014-10-19 – 2014-10-24 (×6): 5 mg via ORAL
  Filled 2014-10-19 (×8): qty 1

## 2014-10-19 MED ORDER — LORAZEPAM 2 MG/ML IJ SOLN
1.0000 mg | Freq: Once | INTRAMUSCULAR | Status: AC
  Administered 2014-10-19: 1 mg via INTRAVENOUS
  Filled 2014-10-19: qty 1

## 2014-10-19 MED ORDER — CEFAZOLIN SODIUM-DEXTROSE 2-3 GM-% IV SOLR
2.0000 g | INTRAVENOUS | Status: AC
Start: 2014-10-20 — End: 2014-10-20
  Administered 2014-10-20: 2 g via INTRAVENOUS
  Filled 2014-10-19: qty 50

## 2014-10-19 MED ORDER — MORPHINE SULFATE 4 MG/ML IJ SOLN
4.0000 mg | Freq: Once | INTRAMUSCULAR | Status: AC
  Administered 2014-10-19: 4 mg via INTRAVENOUS
  Filled 2014-10-19: qty 1

## 2014-10-19 MED ORDER — OXYCODONE-ACETAMINOPHEN 5-325 MG PO TABS
2.0000 | ORAL_TABLET | Freq: Once | ORAL | Status: AC
  Administered 2014-10-19: 2 via ORAL
  Filled 2014-10-19: qty 2

## 2014-10-19 MED ORDER — HYDROMORPHONE HCL 1 MG/ML IJ SOLN
1.0000 mg | Freq: Once | INTRAMUSCULAR | Status: AC
  Administered 2014-10-19: 1 mg via INTRAVENOUS
  Filled 2014-10-19: qty 1

## 2014-10-19 MED ORDER — FLUOXETINE HCL 20 MG PO CAPS
40.0000 mg | ORAL_CAPSULE | Freq: Every day | ORAL | Status: DC
  Administered 2014-10-19 – 2014-10-25 (×6): 40 mg via ORAL
  Filled 2014-10-19 (×7): qty 2

## 2014-10-19 MED ORDER — OXYCODONE HCL 5 MG PO TABS
10.0000 mg | ORAL_TABLET | ORAL | Status: DC | PRN
  Administered 2014-10-19 – 2014-10-20 (×6): 10 mg via ORAL
  Filled 2014-10-19 (×6): qty 2

## 2014-10-19 MED ORDER — HYDROMORPHONE HCL 1 MG/ML IJ SOLN
1.0000 mg | INTRAMUSCULAR | Status: DC | PRN
  Administered 2014-10-20 – 2014-10-23 (×9): 1 mg via INTRAVENOUS
  Filled 2014-10-19 (×9): qty 1

## 2014-10-19 MED ORDER — ENOXAPARIN SODIUM 30 MG/0.3ML ~~LOC~~ SOLN
30.0000 mg | Freq: Two times a day (BID) | SUBCUTANEOUS | Status: AC
  Administered 2014-10-19 – 2014-10-20 (×2): 30 mg via SUBCUTANEOUS
  Filled 2014-10-19 (×3): qty 0.3

## 2014-10-19 MED ORDER — TRAZODONE HCL 150 MG PO TABS
300.0000 mg | ORAL_TABLET | Freq: Every day | ORAL | Status: DC
  Administered 2014-10-19 – 2014-10-24 (×6): 300 mg via ORAL
  Filled 2014-10-19 (×9): qty 2

## 2014-10-19 MED ORDER — CYCLOBENZAPRINE HCL 10 MG PO TABS
10.0000 mg | ORAL_TABLET | Freq: Three times a day (TID) | ORAL | Status: DC | PRN
  Administered 2014-10-19 – 2014-10-25 (×11): 10 mg via ORAL
  Filled 2014-10-19 (×11): qty 1

## 2014-10-19 MED ORDER — ONDANSETRON HCL 4 MG/2ML IJ SOLN
4.0000 mg | Freq: Once | INTRAMUSCULAR | Status: AC
  Administered 2014-10-19: 4 mg via INTRAVENOUS
  Filled 2014-10-19: qty 2

## 2014-10-19 MED ORDER — IOHEXOL 300 MG/ML  SOLN
100.0000 mL | Freq: Once | INTRAMUSCULAR | Status: AC | PRN
  Administered 2014-10-19: 100 mL via INTRAVENOUS

## 2014-10-19 MED ORDER — TRAZODONE HCL 150 MG PO TABS: 300.0000 mg | ORAL_TABLET | Freq: Every day | ORAL | Status: DC

## 2014-10-19 MED ORDER — TETANUS-DIPHTH-ACELL PERTUSSIS 5-2.5-18.5 LF-MCG/0.5 IM SUSP
0.5000 mL | Freq: Once | INTRAMUSCULAR | Status: AC
  Administered 2014-10-19: 0.5 mL via INTRAMUSCULAR
  Filled 2014-10-19: qty 0.5

## 2014-10-19 MED ORDER — BUSPIRONE HCL 15 MG PO TABS
7.5000 mg | ORAL_TABLET | Freq: Three times a day (TID) | ORAL | Status: DC
  Administered 2014-10-19 – 2014-10-25 (×15): 7.5 mg via ORAL
  Filled 2014-10-19 (×20): qty 1

## 2014-10-19 MED ORDER — FLUTICASONE PROPIONATE 50 MCG/ACT NA SUSP
2.0000 | Freq: Every day | NASAL | Status: DC
  Administered 2014-10-19 – 2014-10-25 (×6): 2 via NASAL
  Filled 2014-10-19: qty 16

## 2014-10-19 MED ORDER — LAMOTRIGINE 100 MG PO TABS
100.0000 mg | ORAL_TABLET | Freq: Two times a day (BID) | ORAL | Status: DC
  Administered 2014-10-19 – 2014-10-25 (×11): 100 mg via ORAL
  Filled 2014-10-19 (×13): qty 1
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
## 2014-10-19 NOTE — ED Notes (Signed)
Ed, RN assisted me with in and out cath; urine collected and sent to lab for testing
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
## 2014-10-19 NOTE — ED Notes (Signed)
Waiting for pt to calm down before performing in and out cath

## 2014-10-19 NOTE — ED Notes (Signed)
Patient transported to CT and xray 

## 2014-10-19 NOTE — ED Notes (Signed)
Re-paged Dr. Marlou Sa x3 to [phone]

## 2014-10-19 NOTE — ED Notes (Signed)
Pt brought in via [place] on LSB with c-collar intact; myself, Ed, RN and Santiago Glad, RN assisted Ward, MD with getting pt off LSB; pt's clothes were then cut off her per pt's permission and placed in a personal belongings bag; pt was placed on monitor, continuous pulse oximetry and blood pressure cuff; warm blankets given and c-collar still intact
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
## 2014-10-19 NOTE — ED Notes (Signed)
Pt. Grimaces, bangs the stretcher, and yells out when experiencing cramping in lower legs; encouraged to relax; Dr. Leonides Schanz made aware.

## 2014-10-19 NOTE — ED Notes (Signed)
Patient transported to Ultrasound 
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
## 2014-10-19 NOTE — ED Notes (Signed)
Report to butch and matt, RN's.  Pt care transferred.

## 2014-10-20 LAB — SURGICAL PCR SCREEN
MRSA, PCR: NEGATIVE
Staphylococcus aureus: NEGATIVE

## 2014-10-20 MED ORDER — CEFAZOLIN SODIUM-DEXTROSE 2-3 GM-% IV SOLR
2.0000 g | Freq: Once | INTRAVENOUS | Status: AC
  Administered 2014-10-21: 2 g via INTRAVENOUS
  Filled 2014-10-20: qty 50

## 2014-10-20 MED ORDER — WHITE PETROLATUM GEL
Status: AC
  Administered 2014-10-20: 20:00:00
  Filled 2014-10-20: qty 5

## 2014-10-20 MED ORDER — INFLUENZA VAC SPLIT QUAD 0.5 ML IM SUSY
0.5000 mL | PREFILLED_SYRINGE | INTRAMUSCULAR | Status: DC
  Filled 2014-10-20: qty 0.5

## 2014-10-20 MED ORDER — MIDAZOLAM HCL 2 MG/2ML IJ SOLN: 1.0000 mg | INTRAMUSCULAR | Status: DC | PRN

## 2014-10-20 MED ORDER — SIMETHICONE 80 MG PO CHEW
80.0000 mg | CHEWABLE_TABLET | Freq: Four times a day (QID) | ORAL | Status: DC | PRN
  Administered 2014-10-21: 80 mg via ORAL
  Filled 2014-10-20: qty 1

## 2014-10-20 MED ORDER — FLUTICASONE PROPIONATE 50 MCG/ACT NA SUSP
2.0000 | NASAL | Status: DC | PRN
  Administered 2014-10-20: 2 via NASAL
  Filled 2014-10-20: qty 16

## 2014-10-20 MED ORDER — FENTANYL CITRATE 0.05 MG/ML IJ SOLN: 50.0000 ug | INTRAMUSCULAR | Status: DC | PRN

## 2014-10-20 MED ORDER — PNEUMOCOCCAL VAC POLYVALENT 25 MCG/0.5ML IJ INJ
0.5000 mL | INJECTION | INTRAMUSCULAR | Status: DC
  Filled 2014-10-20: qty 0.5

## 2014-10-20 NOTE — Progress Notes (Signed)
[facility] Progress Note Patient Details:  Lauren Fuentes 02/02/1966 [id number] Called in order to [facility]. Patient ID: [name], female   DOB: March 14, 1966, 48 y.o.   MRN: [id number]   Darrol Poke 10/20/2014, 9:15 AM

## 2014-10-20 NOTE — Progress Notes (Signed)
Chaplain visited with pt after nurse referral. Chaplain offered prayer, emotional support, prayer shawl, devotional, and Bible. Pt expressed to chaplain that she would like to live in a different place. Chaplain recommends social work be consulted. Chaplain will continue to follow.  10/20/14 1600  Clinical Encounter Type  Visited With Patient;Health care provider  Visit Type Initial;Spiritual support  Referral From Nurse  Consult/Referral To Social work  Recommendations Follow Up  Spiritual Encounters  Spiritual Needs Emotional;Prayer;Sacred text  Stress Factors  Patient Stress Factors Family relationships;Major life changes;Health changes  Lauren Fuentes, Epifanio Lesches 10/20/2014 5:02 PM
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
## 2014-10-20 NOTE — Progress Notes (Signed)
PT Cancellation Note  Patient Details Name: Toneisha Savary MRN: [id number] DOB: 08-17-1966   Cancelled Treatment:     Pt reports she is have significant pain in her feet and back and does not want to get OOB. Pt has a compression fracture and no orders written regarding the need for a lumbar brace. In addition, the patient is schedule for an ORIF of left calcaneous fx tomorrow. Please clarify WB status of BOTH LEs after surgery. PT to evaluate after surgery.   Lelon Mast 10/20/2014, 11:22 AM

## 2014-10-20 NOTE — Progress Notes (Signed)
TLSO brace applied.

## 2014-10-20 NOTE — Progress Notes (Signed)
Pt stable Pain ok Feet in jones dressings - no fx blisters yesterday Bedrest today but can get oob wbat on right heel for transfers once brace in place Surgery likely tomorrow with Dr Marcelino Scot

## 2014-10-20 NOTE — Progress Notes (Signed)
Ortho tech paged regarding MD orders for lumbar corsett brace as well as watson jones splint. RN informed that brace would be ordered and that ortho tech would check MD orders for splint. No ortho tech follow up and patient still without splint. New ortho tech notified, states orders will be carried out this a.m.

## 2014-10-20 NOTE — Consult Note (Signed)
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 or forefoot. Degenerative changes are present at the first metatarsal phalangeal joint with small osteophytes. The additional metatarsal phalangeal joints appear normal. The alignment is normal at the Lisfranc joint.  IMPRESSION: 1. Comminuted fracture of the calcaneal tuberosity does demonstrate intra-articular extension into the posterior facet of the subtalar joint. However, the intra-articular component is nondisplaced. 2. No other tarsal bone fractures identified. No evidence of acute injury at the ankle.   Electronically Signed   By: Camie Patience M.D.   On: 10/19/2014 17:11   Ct Ankle Left Wo Contrast  10/19/2014   CLINICAL DATA:  Jumped from a second story today. Calcaneal fracture. Initial encounter.  EXAM: CT OF THE LEFT ANKLE WITHOUT CONTRAST, CT OF THE LEFT FOOT WITHOUT CONTRAST  TECHNIQUE: Multidetector CT imaging of the left ankle and foot was performed according to the standard protocol. Multiplanar CT image reconstructions were also generated.  COMPARISON:  Radiographs same date  FINDINGS: Ankle findings: There is a mildly comminuted and displaced fracture of the calcaneal tuberosity. Portions of the tuberosity demonstrates up to 4 mm of displacement medially. This fracture demonstrates a  nondisplaced component extending into the posterior facet  of the subtalar joint. There is no significant depression of the articular surface. The calcaneal body and anterior process are intact.  The distal tibia and distal fibula are intact. The talus, navicular and cuboid are intact. There is soft tissue swelling in the hindfoot adjacent to the calcaneal fracture. No significant ankle joint effusion, tendon entrapment or disruption identified.  Foot findings: There is no evidence of acute fracture or subluxation within the midfoot or forefoot. Degenerative changes are present at the first metatarsal phalangeal joint with small osteophytes. The additional metatarsal phalangeal joints appear normal. The alignment is normal at the Lisfranc joint.  IMPRESSION: 1. Comminuted fracture of the calcaneal tuberosity does demonstrate intra-articular extension into the posterior facet of the subtalar joint. However, the intra-articular component is nondisplaced. 2. No other tarsal bone fractures identified. No evidence of acute injury at the ankle.   Electronically Signed   By: Camie Patience M.D.   On: 10/19/2014 17:11   [place] Art/ven Flow Abd Pelv Doppler  10/19/2014   CLINICAL DATA:  Left adnexal mass seen on CT scan.  EXAM: TRANSABDOMINAL AND TRANSVAGINAL ULTRASOUND OF PELVIS  DOPPLER ULTRASOUND OF OVARIES  TECHNIQUE: Both transabdominal and transvaginal ultrasound examinations of the pelvis were performed. Transabdominal technique was performed for global imaging of the pelvis including uterus, ovaries, adnexal regions, and pelvic cul-de-sac.  It was necessary to proceed with endovaginal exam following the transabdominal exam to visualize the ovaries. Color and duplex Doppler ultrasound was utilized to evaluate blood flow to the ovaries. However, patient has broken foot and therefore was unable to use stirrups for positioning for transvaginal imaging.  COMPARISON:  CT scan of same day.  FINDINGS: Uterus  Measurements: 7.6  x 5.3 x 3.4 cm. No fibroids or other mass visualized.  Endometrium  Thickness: 9 mm which is within normal limits. No focal abnormality visualized.  Right ovary  Measurements: 2.9 x 2.7 x 1.3 cm. Small follicular cysts are noted.  Left ovary  Measurements: 4.8 x 5.0 x 3.5 cm. The cystic abnormality seen on prior CT scan is not clearly visualized on this exam.  Pulsed Doppler evaluation of both ovaries demonstrates normal low-resistance arterial and venous waveforms.  Other findings  No free fluid.  IMPRESSION: Limited transvaginal imaging as patient could not be positioned properly due to broken foot. The cystic abnormality described in the left ovary on CT scan of same day is not clearly visualized. The left ovary is somewhat enlarged when compared to the right ovary. It is recommended that a follow-up transvaginal ultrasound in 4-6 weeks be obtained when the patient can be properly positioned in order to rule out pathology.   Electronically Signed   By: Sabino Dick M.D.   On: 10/19/2014 16:45   Dg Knee Complete 4 Views Left  10/19/2014   CLINICAL DATA:  Jumped out of second story window today.  Leg pain  EXAM: LEFT KNEE - COMPLETE 4+ VIEW  COMPARISON:  08/16/2012  FINDINGS: Normal alignment no fracture  Mild degenerative change with mild joint space narrowing and spurring in all 3 compartments. Negative for joint effusion.  IMPRESSION: Negative for fracture.   Electronically Signed   By: Franchot Gallo M.D.   On: 10/19/2014 12:41   Dg Knee Complete 4 Views Right  10/19/2014   CLINICAL DATA:  Jumped out of a window and landed on her feet. Persistent lower extremity pain.  EXAM: RIGHT KNEE - COMPLETE 4+ VIEW; LEFT ANKLE COMPLETE - 3+ VIEW; LEFT TIBIA AND FIBULA -  2 VIEW; RIGHT TIBIA AND FIBULA - 2 VIEW; RIGHT ANKLE - COMPLETE 3+ VIEW; LEFT FOOT - COMPLETE 3+ VIEW; RIGHT FOOT COMPLETE - 3+ VIEW  COMPARISON:  None.  FINDINGS: Bilateral knees:  The joint spaces are maintained. Minimal degenerative changes. No  acute fracture or joint effusion.  Bilateral tibia/ fibula:  The knee and ankle joints are maintained. No acute fracture of the tibia or fibula is identified.  Bilateral ankles:  The ankle mortise is maintained. No acute ankle fracture. Small bony densities near the lateral malleolus are likely remote avulsion fractures or unfused secondary ossification centers.  Bilateral feet:  There is a comminuted but nondepressed calcaneal fracture on the left. The remaining bony structures are intact. The right foot demonstrates a fourth metatarsal head fracture with mild impaction and slight rotation.  IMPRESSION: 1. Comminuted but nondisplaced left calcaneal fracture. CT recommended for further evaluation. 2. Slightly impacted and rotated fourth right metacarpal head fracture. 3. No fractures involving the knees, tibia/fibula or ankles.   Electronically Signed   By: Kalman Jewels M.D.   On: 10/19/2014 12:49   Dg Foot Complete Left  10/19/2014   CLINICAL DATA:  Jumped out of a window and landed on her feet. Persistent lower extremity pain.  EXAM: RIGHT KNEE - COMPLETE 4+ VIEW; LEFT ANKLE COMPLETE - 3+ VIEW; LEFT TIBIA AND FIBULA - 2 VIEW; RIGHT TIBIA AND FIBULA - 2 VIEW; RIGHT ANKLE - COMPLETE 3+ VIEW; LEFT FOOT - COMPLETE 3+ VIEW; RIGHT FOOT COMPLETE - 3+ VIEW  COMPARISON:  None.  FINDINGS: Bilateral knees:  The joint spaces are maintained. Minimal degenerative changes. No acute fracture or joint effusion.  Bilateral tibia/ fibula:  The knee and ankle joints are maintained. No acute fracture of the tibia or fibula is identified.  Bilateral ankles:  The ankle mortise is maintained. No acute ankle fracture. Small bony densities near the lateral malleolus are likely remote avulsion fractures or unfused secondary ossification centers.  Bilateral feet:  There is a comminuted but nondepressed calcaneal fracture on the left. The remaining bony structures are intact. The right foot demonstrates a fourth metatarsal head  fracture with mild impaction and slight rotation.  IMPRESSION: 1. Comminuted but nondisplaced left calcaneal fracture. CT recommended for further evaluation. 2. Slightly impacted and rotated fourth right metacarpal head fracture. 3. No fractures involving the knees, tibia/fibula or ankles.   Electronically Signed   By: Kalman Jewels M.D.   On: 10/19/2014 12:49   Dg Foot Complete Right  10/19/2014   CLINICAL DATA:  Jumped out of a window and landed on her feet. Persistent lower extremity pain.  EXAM: RIGHT KNEE - COMPLETE 4+ VIEW; LEFT ANKLE COMPLETE - 3+ VIEW; LEFT TIBIA AND FIBULA - 2 VIEW; RIGHT TIBIA AND FIBULA - 2 VIEW; RIGHT ANKLE - COMPLETE 3+ VIEW; LEFT FOOT - COMPLETE 3+ VIEW; RIGHT FOOT COMPLETE - 3+ VIEW  COMPARISON:  None.  FINDINGS: Bilateral knees:  The joint spaces are maintained. Minimal degenerative changes. No acute fracture or joint effusion.  Bilateral tibia/ fibula:  The knee and ankle joints are maintained. No acute fracture of the tibia or fibula is identified.  Bilateral ankles:  The ankle mortise is maintained. No acute ankle fracture. Small bony densities near the lateral malleolus are likely remote avulsion fractures or unfused secondary ossification centers.  Bilateral feet:  There is a comminuted but nondepressed calcaneal fracture on the left. The remaining bony structures are intact. The right foot demonstrates a fourth metatarsal head fracture  with mild impaction and slight rotation.  IMPRESSION: 1. Comminuted but nondisplaced left calcaneal fracture. CT recommended for further evaluation. 2. Slightly impacted and rotated fourth right metacarpal head fracture. 3. No fractures involving the knees, tibia/fibula or ankles.   Electronically Signed   By: Kalman Jewels M.D.   On: 10/19/2014 12:49    Labs:  Recent Labs  10/19/14 0950 10/19/14 1007  WBC 8.6  --   RBC 4.48  --   HCT 41.2 43.0  PLT 288  --     Recent Labs  10/19/14 0950 10/19/14 1007  NA 145 144  K  4.2 3.9  CL 107 108  CO2 24  --   BUN 8 6  CREATININE 0.95 1.00  GLUCOSE 86 88  CALCIUM 9.0  --     Recent Labs  10/19/14 0950  INR 1.03    Review of Systems: Review of Systems  Musculoskeletal: Positive for back pain.  Neurological: Negative.     Physical Exam:  Tender over around the T11-L3 spinous processes.  Neg log roll bilat hips.  Moves toes well.  Sensation intact.  bilat le splints on.   Assessment and Plan: T12 and L2 compression fractures.  Dr Rolena Infante has reviewed images.  Will have ortho tech apply TLSO.  Do not anticipate needing surgery.  Will see how she does.  May consider lumbar MRI if she does have true radiculopathy.  Difficult to tell now with multiple injuries that she has.  All questions answered.    Melina Schools, MD [facility] [phone]

## 2014-10-20 NOTE — Consult Note (Signed)
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 is no perinephric fluid or contrast extravasation. No renal contusion or laceration appreciated. There is no renal or ureteral calculus on either side.  In the pelvis, the urinary bladder is midline with normal wall thickness. There is a somewhat complex left adnexal mass measuring 4.5 x 3.6 cm which has attenuation values higher than expected with a cyst. There is at least one septation within this mass. There is a dominant follicle in the right ovary, a physiologic finding. This follicle measures 2.0 by 1.6 cm in the right ovary. No other pelvic masses are identified. There is no pelvic fluid collection.  No lesions are identified in the abdomen or pelvic walls. There is no bowel obstruction. No free air or portal venous air. The periappendiceal region appears unremarkable.  There is no ascites, adenopathy, or abscess in the abdomen or pelvis. No bowel wall thickening is appreciable on this study. There is no mesenteric thickening appreciable. The aorta shows no evidence of aneurysm. There is no periaortic fluid or extravasation.  There is a focal calcification adjacent to lateral aspect of the greater trochanter on the left, best seen on coronal slice 37 series  5. A similar-appearing calcification is adjacent to the greater trochanter on the right, best seen on coronal slice 43 series 5. Question enthesopathic calcifications versus small avulsion injuries. No other areas of potential fracture appreciable. No blastic or lytic bone lesions are identified. There is mild degenerative type change in portions of the lumbar spine. There are no demonstrable intramuscular hematomas or appreciable intramuscular asymmetry.  IMPRESSION: CT chest:  Study within normal limits.  CT abdomen and pelvis: Somewhat complex left adnexal mass. Statistically, the most likely etiology for this finding is hemorrhagic cyst. Would advise pelvic ultrasound to further evaluate this left adnexal mass, however.  No visceral laceration or rupture is seen. No bowel wall thickening or bowel obstruction. No mesenteric thickening. No evidence suggesting abscess.  Question  enthesopathic calcification versus small avulsion injuries arising from the greater trochanters laterally on each side. No other areas of potential fracture seen on this study.   Electronically Signed   By: Lowella Grip M.D.   On: 10/19/2014 12:21   Ct Foot Left Wo Contrast  10/19/2014   CLINICAL DATA:  Jumped from a second story today. Calcaneal fracture. Initial encounter.  EXAM: CT OF THE LEFT ANKLE WITHOUT CONTRAST, CT OF THE LEFT FOOT WITHOUT CONTRAST  TECHNIQUE: Multidetector CT imaging of the left ankle and foot was performed according to the standard protocol. Multiplanar CT image reconstructions were also generated.  COMPARISON:  Radiographs same date  FINDINGS: Ankle findings: There is a mildly comminuted and displaced fracture of the calcaneal tuberosity. Portions of the tuberosity demonstrates up to 4 mm of displacement medially. This fracture demonstrates a nondisplaced component extending into the posterior facet of the subtalar joint. There is no significant depression of the articular surface. The calcaneal body and  anterior process are intact.  The distal tibia and distal fibula are intact. The talus, navicular and cuboid are intact. There is soft tissue swelling in the hindfoot adjacent to the calcaneal fracture. No significant ankle joint effusion, tendon entrapment or disruption identified.  Foot findings: There is no evidence of acute fracture or subluxation within the midfoot or forefoot. Degenerative changes are present at the first metatarsal phalangeal joint with small osteophytes. The additional metatarsal phalangeal joints appear normal. The alignment is normal at the Lisfranc joint.  IMPRESSION: 1. Comminuted fracture of the calcaneal tuberosity does demonstrate intra-articular extension into the posterior facet of the subtalar joint. However, the intra-articular component is nondisplaced. 2. No other tarsal bone fractures identified. No evidence of acute injury at the ankle.   Electronically Signed   By: Camie Patience M.D.   On: 10/19/2014 17:11   Ct Ankle Left Wo Contrast  10/19/2014   CLINICAL DATA:  Jumped from a second story today. Calcaneal fracture. Initial encounter.  EXAM: CT OF THE LEFT ANKLE WITHOUT CONTRAST, CT OF THE LEFT FOOT WITHOUT CONTRAST  TECHNIQUE: Multidetector CT imaging of the left ankle and foot was performed according to the standard protocol. Multiplanar CT image reconstructions were also generated.  COMPARISON:  Radiographs same date  FINDINGS: Ankle findings: There is a mildly comminuted and displaced fracture of the calcaneal tuberosity. Portions of the tuberosity demonstrates up to 4 mm of displacement medially. This fracture demonstrates a nondisplaced component extending into the posterior facet of the subtalar joint. There is no significant depression of the articular surface. The calcaneal body and anterior process are intact.  The distal tibia and distal fibula are intact. The talus, navicular and cuboid are intact. There is soft tissue swelling in the hindfoot adjacent to the  calcaneal fracture. No significant ankle joint effusion, tendon entrapment or disruption identified.  Foot findings: There is no evidence of acute fracture or subluxation within the midfoot or forefoot. Degenerative changes are present at the first metatarsal phalangeal joint with small osteophytes. The additional metatarsal phalangeal joints appear normal. The alignment is normal at the Lisfranc joint.  IMPRESSION: 1. Comminuted fracture of the calcaneal tuberosity does demonstrate intra-articular extension into the posterior facet of the subtalar joint. However, the intra-articular component is nondisplaced. 2. No other tarsal bone fractures identified. No evidence of acute injury at the ankle.   Electronically Signed   By: Camie Patience M.D.   On: 10/19/2014 17:11   [place] Art/ven Flow Abd Pelv Doppler  10/19/2014  CLINICAL DATA:  Left adnexal mass seen on CT scan.  EXAM: TRANSABDOMINAL AND TRANSVAGINAL ULTRASOUND OF PELVIS  DOPPLER ULTRASOUND OF OVARIES  TECHNIQUE: Both transabdominal and transvaginal ultrasound examinations of the pelvis were performed. Transabdominal technique was performed for global imaging of the pelvis including uterus, ovaries, adnexal regions, and pelvic cul-de-sac.  It was necessary to proceed with endovaginal exam following the transabdominal exam to visualize the ovaries. Color and duplex Doppler ultrasound was utilized to evaluate blood flow to the ovaries. However, patient has broken foot and therefore was unable to use stirrups for positioning for transvaginal imaging.  COMPARISON:  CT scan of same day.  FINDINGS: Uterus  Measurements: 7.6 x 5.3 x 3.4 cm. No fibroids or other mass visualized.  Endometrium  Thickness: 9 mm which is within normal limits. No focal abnormality visualized.  Right ovary  Measurements: 2.9 x 2.7 x 1.3 cm. Small follicular cysts are noted.  Left ovary  Measurements: 4.8 x 5.0 x 3.5 cm. The cystic abnormality seen on prior CT scan is not clearly visualized  on this exam.  Pulsed Doppler evaluation of both ovaries demonstrates normal low-resistance arterial and venous waveforms.  Other findings  No free fluid.  IMPRESSION: Limited transvaginal imaging as patient could not be positioned properly due to broken foot. The cystic abnormality described in the left ovary on CT scan of same day is not clearly visualized. The left ovary is somewhat enlarged when compared to the right ovary. It is recommended that a follow-up transvaginal ultrasound in 4-6 weeks be obtained when the patient can be properly positioned in order to rule out pathology.   Electronically Signed   By: Sabino Dick M.D.   On: 10/19/2014 16:45   Dg Knee Complete 4 Views Left  10/19/2014   CLINICAL DATA:  Jumped out of second story window today.  Leg pain  EXAM: LEFT KNEE - COMPLETE 4+ VIEW  COMPARISON:  08/16/2012  FINDINGS: Normal alignment no fracture  Mild degenerative change with mild joint space narrowing and spurring in all 3 compartments. Negative for joint effusion.  IMPRESSION: Negative for fracture.   Electronically Signed   By: Franchot Gallo M.D.   On: 10/19/2014 12:41   Dg Knee Complete 4 Views Right  10/19/2014   CLINICAL DATA:  Jumped out of a window and landed on her feet. Persistent lower extremity pain.  EXAM: RIGHT KNEE - COMPLETE 4+ VIEW; LEFT ANKLE COMPLETE - 3+ VIEW; LEFT TIBIA AND FIBULA - 2 VIEW; RIGHT TIBIA AND FIBULA - 2 VIEW; RIGHT ANKLE - COMPLETE 3+ VIEW; LEFT FOOT - COMPLETE 3+ VIEW; RIGHT FOOT COMPLETE - 3+ VIEW  COMPARISON:  None.  FINDINGS: Bilateral knees:  The joint spaces are maintained. Minimal degenerative changes. No acute fracture or joint effusion.  Bilateral tibia/ fibula:  The knee and ankle joints are maintained. No acute fracture of the tibia or fibula is identified.  Bilateral ankles:  The ankle mortise is maintained. No acute ankle fracture. Small bony densities near the lateral malleolus are likely remote avulsion fractures or unfused secondary  ossification centers.  Bilateral feet:  There is a comminuted but nondepressed calcaneal fracture on the left. The remaining bony structures are intact. The right foot demonstrates a fourth metatarsal head fracture with mild impaction and slight rotation.  IMPRESSION: 1. Comminuted but nondisplaced left calcaneal fracture. CT recommended for further evaluation. 2. Slightly impacted and rotated fourth right metacarpal head fracture. 3. No fractures involving the knees, tibia/fibula or ankles.   Electronically  Signed   By: Kalman Jewels M.D.   On: 10/19/2014 12:49   Dg Foot Complete Left  10/19/2014   CLINICAL DATA:  Jumped out of a window and landed on her feet. Persistent lower extremity pain.  EXAM: RIGHT KNEE - COMPLETE 4+ VIEW; LEFT ANKLE COMPLETE - 3+ VIEW; LEFT TIBIA AND FIBULA - 2 VIEW; RIGHT TIBIA AND FIBULA - 2 VIEW; RIGHT ANKLE - COMPLETE 3+ VIEW; LEFT FOOT - COMPLETE 3+ VIEW; RIGHT FOOT COMPLETE - 3+ VIEW  COMPARISON:  None.  FINDINGS: Bilateral knees:  The joint spaces are maintained. Minimal degenerative changes. No acute fracture or joint effusion.  Bilateral tibia/ fibula:  The knee and ankle joints are maintained. No acute fracture of the tibia or fibula is identified.  Bilateral ankles:  The ankle mortise is maintained. No acute ankle fracture. Small bony densities near the lateral malleolus are likely remote avulsion fractures or unfused secondary ossification centers.  Bilateral feet:  There is a comminuted but nondepressed calcaneal fracture on the left. The remaining bony structures are intact. The right foot demonstrates a fourth metatarsal head fracture with mild impaction and slight rotation.  IMPRESSION: 1. Comminuted but nondisplaced left calcaneal fracture. CT recommended for further evaluation. 2. Slightly impacted and rotated fourth right metacarpal head fracture. 3. No fractures involving the knees, tibia/fibula or ankles.   Electronically Signed   By: Kalman Jewels M.D.   On:  10/19/2014 12:49   Dg Foot Complete Right  10/19/2014   CLINICAL DATA:  Jumped out of a window and landed on her feet. Persistent lower extremity pain.  EXAM: RIGHT KNEE - COMPLETE 4+ VIEW; LEFT ANKLE COMPLETE - 3+ VIEW; LEFT TIBIA AND FIBULA - 2 VIEW; RIGHT TIBIA AND FIBULA - 2 VIEW; RIGHT ANKLE - COMPLETE 3+ VIEW; LEFT FOOT - COMPLETE 3+ VIEW; RIGHT FOOT COMPLETE - 3+ VIEW  COMPARISON:  None.  FINDINGS: Bilateral knees:  The joint spaces are maintained. Minimal degenerative changes. No acute fracture or joint effusion.  Bilateral tibia/ fibula:  The knee and ankle joints are maintained. No acute fracture of the tibia or fibula is identified.  Bilateral ankles:  The ankle mortise is maintained. No acute ankle fracture. Small bony densities near the lateral malleolus are likely remote avulsion fractures or unfused secondary ossification centers.  Bilateral feet:  There is a comminuted but nondepressed calcaneal fracture on the left. The remaining bony structures are intact. The right foot demonstrates a fourth metatarsal head fracture with mild impaction and slight rotation.  IMPRESSION: 1. Comminuted but nondisplaced left calcaneal fracture. CT recommended for further evaluation. 2. Slightly impacted and rotated fourth right metacarpal head fracture. 3. No fractures involving the knees, tibia/fibula or ankles.   Electronically Signed   By: Kalman Jewels M.D.   On: 10/19/2014 12:49    ROS Blood pressure 105/56, pulse 84, temperature 97.5 F (36.4 C), temperature source Oral, resp. rate 16, last menstrual period 09/19/2014, SpO2 95.00%. Physical Exam [place]/AT No wheezing Abd S/NT Pelvis--no traumatic wounds or rash, no ecchymosis, stable to manual stress, nontender RUEx shoulder, elbow, wrist, digits- no skin wounds, nontender, no instability, no blocks to motion  Sens  Ax/R/M/U intact  Mot   Ax/ R/ PIN/ M/ AIN/ U intact  Rad 2+ LUEx shoulder, elbow, wrist, digits- no skin wounds, nontender, no  instability, no blocks to motion  Sens  Ax/R/M/U intact  Mot   Ax/ R/ PIN/ M/ AIN/ U intact  Rad 2+ RLE No traumatic wounds, ecchymosis, or  rash  Tender forefoot with swelling  No knee effusion  Sens DPN, SPN, TN intact  Motor ext, flex great and lesser toes; flex, ext ankle; strength confounded by pain  DP 2+ LLE No traumatic wounds, ecchymosis, or rash  Tender heel  No knee effusion  Sens DPN, SPN, TN intact  Motor ext, flex great and lesser toes; flex, ext ankle; strength confounded by pain  DP 2+  Assessment/Plan: Right 4th metatarsal neck fracture, left calcaneus fracture--tongue type, T12--L2 endplate compression fractures.  1. Plan for ORIF of the calcaneus tomorrow to reduce chance of subsequent displacement and nonunion 2. WBAT in post-op shoe through heel 3. Will ask spine colleague to eval compression fractures for possible bracing and follow up. 4. Social work to begin d/c planning which appears complex at this time.  Appreciate the excellent evaluation and care of Dr. Marlou Sa, have discussed with him, and will assume management.   [facility], MD [facility] [phone] (p)  10/20/2014  8:57 AM

## 2014-10-20 NOTE — Progress Notes (Signed)
Utilization review completed.  

## 2014-10-21 ENCOUNTER — Encounter (HOSPITAL_COMMUNITY)
Admission: EM | Disposition: A | Discharge status: Home / Self Care | Payer: Self-pay | Source: Home / Self Care | Attending: Orthopedic Surgery

## 2014-10-21 ENCOUNTER — Encounter (HOSPITAL_COMMUNITY): Payer: Self-pay | Admitting: Certified Registered Nurse Anesthetist

## 2014-10-21 ENCOUNTER — Encounter (HOSPITAL_COMMUNITY): Payer: Medicare Other | Admitting: Anesthesiology

## 2014-10-21 ENCOUNTER — Inpatient Hospital Stay (HOSPITAL_COMMUNITY): Payer: Medicare Other

## 2014-10-21 ENCOUNTER — Inpatient Hospital Stay (HOSPITAL_COMMUNITY): Payer: Medicare Other | Admitting: Anesthesiology

## 2014-10-21 HISTORY — PX: ORIF CALCANEOUS FRACTURE: SHX5030

## 2014-10-21 LAB — CBC
HEMATOCRIT: 35.9 % — AB (ref 36.0–46.0)
Hemoglobin: 11.7 g/dL — ABNORMAL LOW (ref 12.0–15.0)
MCH: 30.7 pg (ref 26.0–34.0)
MCHC: 32.6 g/dL (ref 30.0–36.0)
MCV: 94.2 fL (ref 78.0–100.0)
PLATELETS: 202 10*3/uL (ref 150–400)
RBC: 3.81 MIL/uL — AB (ref 3.87–5.11)
RDW: 13.8 % (ref 11.5–15.5)
WBC: 7 10*3/uL (ref 4.0–10.5)

## 2014-10-21 LAB — HCG, SERUM, QUALITATIVE: PREG SERUM: NEGATIVE

## 2014-10-21 LAB — CREATININE, SERUM
Creatinine, Ser: 0.84 mg/dL (ref 0.50–1.10)
GFR calc non Af Amer: 80 mL/min — ABNORMAL LOW (ref >90)

## 2014-10-21 SURGERY — OPEN REDUCTION INTERNAL FIXATION (ORIF) CALCANEOUS FRACTURE
Anesthesia: General | Site: Foot | Laterality: Left

## 2014-10-21 MED ORDER — FENTANYL CITRATE 0.05 MG/ML IJ SOLN
INTRAMUSCULAR | Status: AC
  Filled 2014-10-21: qty 5

## 2014-10-21 MED ORDER — BUPIVACAINE-EPINEPHRINE 0.5% -1:200000 IJ SOLN
INTRAMUSCULAR | Status: DC | PRN
  Administered 2014-10-21: 10 mL

## 2014-10-21 MED ORDER — LIDOCAINE HCL (CARDIAC) 20 MG/ML IV SOLN
INTRAVENOUS | Status: AC
  Filled 2014-10-21: qty 5

## 2014-10-21 MED ORDER — PROMETHAZINE HCL 25 MG/ML IJ SOLN: 6.2500 mg | INTRAMUSCULAR | Status: DC | PRN

## 2014-10-21 MED ORDER — METOCLOPRAMIDE HCL 10 MG PO TABS: 5.0000 mg | ORAL_TABLET | Freq: Three times a day (TID) | ORAL | Status: DC | PRN

## 2014-10-21 MED ORDER — BUPIVACAINE-EPINEPHRINE (PF) 0.5% -1:200000 IJ SOLN
INTRAMUSCULAR | Status: AC
  Filled 2014-10-21: qty 30

## 2014-10-21 MED ORDER — HYDROMORPHONE HCL 1 MG/ML IJ SOLN
0.2500 mg | INTRAMUSCULAR | Status: DC | PRN
  Administered 2014-10-21 (×4): 0.5 mg via INTRAVENOUS

## 2014-10-21 MED ORDER — ONDANSETRON HCL 4 MG/2ML IJ SOLN
INTRAMUSCULAR | Status: AC
  Filled 2014-10-21: qty 2

## 2014-10-21 MED ORDER — OXYCODONE HCL 5 MG PO TABS
5.0000 mg | ORAL_TABLET | Freq: Once | ORAL | Status: AC | PRN
  Administered 2014-10-21: 5 mg via ORAL

## 2014-10-21 MED ORDER — LIDOCAINE HCL (CARDIAC) 20 MG/ML IV SOLN
INTRAVENOUS | Status: DC | PRN
  Administered 2014-10-21: 50 mg via INTRAVENOUS

## 2014-10-21 MED ORDER — CEFAZOLIN SODIUM 1-5 GM-% IV SOLN
1.0000 g | Freq: Four times a day (QID) | INTRAVENOUS | Status: AC
  Administered 2014-10-21 – 2014-10-22 (×3): 1 g via INTRAVENOUS
  Filled 2014-10-21 (×3): qty 50

## 2014-10-21 MED ORDER — METOCLOPRAMIDE HCL 5 MG/ML IJ SOLN: 5.0000 mg | Freq: Three times a day (TID) | INTRAMUSCULAR | Status: DC | PRN

## 2014-10-21 MED ORDER — OXYCODONE HCL 5 MG/5ML PO SOLN: 5.0000 mg | Freq: Once | ORAL | Status: AC | PRN

## 2014-10-21 MED ORDER — CEFAZOLIN SODIUM-DEXTROSE 2-3 GM-% IV SOLR
INTRAVENOUS | Status: AC
  Filled 2014-10-21: qty 50

## 2014-10-21 MED ORDER — ONDANSETRON HCL 4 MG/2ML IJ SOLN
INTRAMUSCULAR | Status: AC
Start: 2014-10-21 — End: 2014-10-21
  Filled 2014-10-21: qty 2

## 2014-10-21 MED ORDER — OXYCODONE HCL 5 MG PO TABS
5.0000 mg | ORAL_TABLET | ORAL | Status: DC | PRN
  Administered 2014-10-22 (×2): 10 mg via ORAL
  Administered 2014-10-22 – 2014-10-24 (×5): 15 mg via ORAL
  Administered 2014-10-25: 5 mg via ORAL
  Administered 2014-10-25: 10 mg via ORAL
  Filled 2014-10-21: qty 3
  Filled 2014-10-21 (×2): qty 2
  Filled 2014-10-21 (×2): qty 3
  Filled 2014-10-21: qty 1
  Filled 2014-10-21: qty 3
  Filled 2014-10-21: qty 2
  Filled 2014-10-21: qty 3

## 2014-10-21 MED ORDER — ROCURONIUM BROMIDE 50 MG/5ML IV SOLN
INTRAVENOUS | Status: AC
  Filled 2014-10-21: qty 1

## 2014-10-21 MED ORDER — MIDAZOLAM HCL 2 MG/2ML IJ SOLN
INTRAMUSCULAR | Status: AC
  Filled 2014-10-21: qty 2

## 2014-10-21 MED ORDER — SODIUM CHLORIDE 0.9 % IJ SOLN
INTRAMUSCULAR | Status: AC
  Filled 2014-10-21: qty 10

## 2014-10-21 MED ORDER — DOCUSATE SODIUM 100 MG PO CAPS
100.0000 mg | ORAL_CAPSULE | Freq: Two times a day (BID) | ORAL | Status: DC
  Administered 2014-10-21 – 2014-10-25 (×9): 100 mg via ORAL
  Filled 2014-10-21 (×10): qty 1

## 2014-10-21 MED ORDER — LACTATED RINGERS IV SOLN
INTRAVENOUS | Status: DC | PRN
  Administered 2014-10-21: 08:00:00 via INTRAVENOUS

## 2014-10-21 MED ORDER — MIDAZOLAM HCL 5 MG/5ML IJ SOLN
INTRAMUSCULAR | Status: DC | PRN
  Administered 2014-10-21: 2 mg via INTRAVENOUS

## 2014-10-21 MED ORDER — ONDANSETRON HCL 4 MG/2ML IJ SOLN: 4.0000 mg | Freq: Four times a day (QID) | INTRAMUSCULAR | Status: DC | PRN

## 2014-10-21 MED ORDER — SUCCINYLCHOLINE CHLORIDE 20 MG/ML IJ SOLN
INTRAMUSCULAR | Status: AC
  Filled 2014-10-21: qty 1

## 2014-10-21 MED ORDER — HYDROMORPHONE HCL 1 MG/ML IJ SOLN
INTRAMUSCULAR | Status: AC
  Administered 2014-10-21: 10:00:00
  Filled 2014-10-21: qty 1

## 2014-10-21 MED ORDER — PROPOFOL 10 MG/ML IV BOLUS
INTRAVENOUS | Status: AC
  Filled 2014-10-21: qty 20

## 2014-10-21 MED ORDER — FENTANYL CITRATE 0.05 MG/ML IJ SOLN
INTRAMUSCULAR | Status: DC | PRN
  Administered 2014-10-21: 50 ug via INTRAVENOUS
  Administered 2014-10-21: 25 ug via INTRAVENOUS
  Administered 2014-10-21: 50 ug via INTRAVENOUS

## 2014-10-21 MED ORDER — EPHEDRINE SULFATE 50 MG/ML IJ SOLN
INTRAMUSCULAR | Status: AC
  Filled 2014-10-21: qty 1

## 2014-10-21 MED ORDER — ENOXAPARIN SODIUM 40 MG/0.4ML ~~LOC~~ SOLN
40.0000 mg | SUBCUTANEOUS | Status: DC
  Administered 2014-10-22 – 2014-10-25 (×4): 40 mg via SUBCUTANEOUS
  Filled 2014-10-21 (×7): qty 0.4

## 2014-10-21 MED ORDER — ONDANSETRON HCL 4 MG PO TABS: 4.0000 mg | ORAL_TABLET | Freq: Four times a day (QID) | ORAL | Status: DC | PRN

## 2014-10-21 MED ORDER — ONDANSETRON HCL 4 MG/2ML IJ SOLN
INTRAMUSCULAR | Status: DC | PRN
  Administered 2014-10-21: 4 mg via INTRAVENOUS

## 2014-10-21 MED ORDER — OXYCODONE HCL 5 MG PO TABS
ORAL_TABLET | ORAL | Status: AC
  Administered 2014-10-21: 10:00:00
  Filled 2014-10-21: qty 1

## 2014-10-21 MED ORDER — PROPOFOL 10 MG/ML IV BOLUS
INTRAVENOUS | Status: DC | PRN
  Administered 2014-10-21: 200 mg via INTRAVENOUS

## 2014-10-21 MED ORDER — OXYCODONE-ACETAMINOPHEN 5-325 MG PO TABS
1.0000 | ORAL_TABLET | Freq: Four times a day (QID) | ORAL | Status: DC | PRN
  Administered 2014-10-21 – 2014-10-25 (×11): 2 via ORAL
  Filled 2014-10-21 (×14): qty 2

## 2014-10-21 SURGICAL SUPPLY — 59 items
BANDAGE ELASTIC 4 VELCRO ST LF (GAUZE/BANDAGES/DRESSINGS) ×6 IMPLANT
BANDAGE ELASTIC 6 VELCRO ST LF (GAUZE/BANDAGES/DRESSINGS) ×6 IMPLANT
BANDAGE ESMARK 6X9 LF (GAUZE/BANDAGES/DRESSINGS) ×1 IMPLANT
BLADE SURG 10 STRL SS (BLADE) ×3 IMPLANT
BNDG COHESIVE 4X5 TAN STRL (GAUZE/BANDAGES/DRESSINGS) ×3 IMPLANT
BNDG ESMARK 6X9 LF (GAUZE/BANDAGES/DRESSINGS) ×3
BNDG GAUZE ELAST 4 BULKY (GAUZE/BANDAGES/DRESSINGS) ×9 IMPLANT
BRUSH SCRUB DISP (MISCELLANEOUS) ×6 IMPLANT
COVER MAYO STAND STRL (DRAPES) ×3 IMPLANT
COVER SURGICAL LIGHT HANDLE (MISCELLANEOUS) ×6 IMPLANT
DRAIN TLS ROUND 10FR (DRAIN) ×3 IMPLANT
DRAPE C-ARM 42X72 X-RAY (DRAPES) ×3 IMPLANT
DRAPE C-ARMOR (DRAPES) ×3 IMPLANT
DRAPE ORTHO SPLIT 77X108 STRL (DRAPES) ×2
DRAPE SURG ORHT 6 SPLT 77X108 (DRAPES) ×1 IMPLANT
DRAPE U-SHAPE 47X51 STRL (DRAPES) ×3 IMPLANT
DRSG EMULSION OIL 3X3 NADH (GAUZE/BANDAGES/DRESSINGS) ×3 IMPLANT
ELECT REM PT RETURN 9FT ADLT (ELECTROSURGICAL) ×3
ELECTRODE REM PT RTRN 9FT ADLT (ELECTROSURGICAL) ×1 IMPLANT
GAUZE SPONGE 4X4 12PLY STRL (GAUZE/BANDAGES/DRESSINGS) ×3 IMPLANT
GLOVE BIO SURGEON STRL SZ7.5 (GLOVE) ×3 IMPLANT
GLOVE BIO SURGEON STRL SZ8 (GLOVE) ×3 IMPLANT
GLOVE BIOGEL PI IND STRL 7.5 (GLOVE) ×1 IMPLANT
GLOVE BIOGEL PI INDICATOR 7.5 (GLOVE) ×2
GOWN STRL REUS W/ TWL LRG LVL3 (GOWN DISPOSABLE) ×2 IMPLANT
GOWN STRL REUS W/ TWL XL LVL3 (GOWN DISPOSABLE) ×1 IMPLANT
GOWN STRL REUS W/TWL LRG LVL3 (GOWN DISPOSABLE) ×4
GOWN STRL REUS W/TWL XL LVL3 (GOWN DISPOSABLE) ×2
K-WIRE 1.8 (WIRE) ×4
K-WIRE FX200X1.8XTROC TIP (WIRE) ×2
KIT BASIN OR (CUSTOM PROCEDURE TRAY) ×3 IMPLANT
KIT ROOM TURNOVER OR (KITS) ×3 IMPLANT
KWIRE FX200X1.8XTROC TIP (WIRE) ×2 IMPLANT
MANIFOLD NEPTUNE II (INSTRUMENTS) ×3 IMPLANT
NEEDLE HYPO 21X1.5 SAFETY (NEEDLE) IMPLANT
NS IRRIG 1000ML POUR BTL (IV SOLUTION) ×3 IMPLANT
PACK ORTHO EXTREMITY (CUSTOM PROCEDURE TRAY) ×3 IMPLANT
PAD ARMBOARD 7.5X6 YLW CONV (MISCELLANEOUS) ×6 IMPLANT
PAD CAST 4YDX4 CTTN HI CHSV (CAST SUPPLIES) ×3 IMPLANT
PADDING CAST COTTON 4X4 STRL (CAST SUPPLIES) ×6
PADDING CAST COTTON 6X4 STRL (CAST SUPPLIES) ×12 IMPLANT
PENCIL BUTTON HOLSTER BLD 10FT (ELECTRODE) ×3 IMPLANT
SCREW CANN 5.0X40MM (Screw) ×3 IMPLANT
SCREW CANN PT 5X38MM (Screw) ×3 IMPLANT
SPONGE GAUZE 4X4 12PLY STER LF (GAUZE/BANDAGES/DRESSINGS) ×3 IMPLANT
SPONGE LAP 18X18 X RAY DECT (DISPOSABLE) ×9 IMPLANT
SPONGE SCRUB IODOPHOR (GAUZE/BANDAGES/DRESSINGS) ×3 IMPLANT
SUCTION FRAZIER TIP 10 FR DISP (SUCTIONS) ×3 IMPLANT
SUT ETHILON 3 0 PS 1 (SUTURE) ×6 IMPLANT
SUT VIC AB 2-0 CT3 27 (SUTURE) IMPLANT
SUT VIC AB 2-0 SH 18 (SUTURE) ×3 IMPLANT
SUT VIC AB 3-0 FS2 27 (SUTURE) ×3 IMPLANT
SYR CONTROL 10ML LL (SYRINGE) IMPLANT
TOWEL OR 17X24 6PK STRL BLUE (TOWEL DISPOSABLE) ×3 IMPLANT
TOWEL OR 17X26 10 PK STRL BLUE (TOWEL DISPOSABLE) ×6 IMPLANT
TUBE CONNECTING 12'X1/4 (SUCTIONS) ×1
TUBE CONNECTING 12X1/4 (SUCTIONS) ×2 IMPLANT
UNDERPAD 30X30 INCONTINENT (UNDERPADS AND DIAPERS) ×3 IMPLANT
WATER STERILE IRR 1000ML POUR (IV SOLUTION) ×3 IMPLANT

## 2014-10-21 NOTE — Transfer of Care (Signed)
Immediate Anesthesia Transfer of Care Note  Patient: Lauren Fuentes  Procedure(s) Performed: Procedure(s): OPEN REDUCTION INTERNAL FIXATION (ORIF) LEFT CALCANEOUS FRACTURE (Left)  Patient Location: PACU  Anesthesia Type:General  Level of Consciousness: awake, alert  and oriented  Airway & Oxygen Therapy: Patient Spontanous Breathing and Patient connected to nasal cannula oxygen  Post-op Assessment: Report given to PACU RN and Post -op Vital signs reviewed and stable  Post vital signs: Reviewed and stable  Complications: No apparent anesthesia complications

## 2014-10-21 NOTE — Anesthesia Postprocedure Evaluation (Signed)
  Anesthesia Post-op Note  Patient: Lauren Fuentes  Procedure(s) Performed: Procedure(s): OPEN REDUCTION INTERNAL FIXATION (ORIF) LEFT CALCANEOUS FRACTURE (Left)  Patient Location: PACU  Anesthesia Type:General  Level of Consciousness: awake, alert  and oriented  Airway and Oxygen Therapy: Patient Spontanous Breathing  Post-op Pain: none  Post-op Assessment: Post-op Vital signs reviewed  Post-op Vital Signs: Reviewed  Last Vitals:  Filed Vitals:   10/21/14 1047  BP: 95/40  Pulse: 74  Temp: 36.7 C  Resp: 18    Complications: No apparent anesthesia complications

## 2014-10-21 NOTE — Brief Op Note (Signed)
10/19/2014 - 10/21/2014  5:43 PM  PATIENT:  Lauren Fuentes  48 y.o. female  PRE-OPERATIVE DIAGNOSIS:  LEFT CALCANEUS FX  POST-OPERATIVE DIAGNOSIS:  LEFT CALCANEUS FX  PROCEDURE:  Procedure(s): OPEN REDUCTION INTERNAL FIXATION (ORIF) LEFT CALCANEUS FRACTURE (Left)  SURGEON:  Surgeon(s) and Role:    * Rozanna Box, MD - Primary  PHYSICIAN ASSISTANT: None  ANESTHESIA:   general  I/O:  Total I/O In: 650 [I.V.:650] Out: 410 [Urine:400; Blood:10]  SPECIMEN:  No Specimen  DICTATION: .Other Dictation: Dictation Number [id number]

## 2014-10-21 NOTE — Progress Notes (Signed)
Orthopedic Tech Progress Note Patient Details:  Lauren Fuentes March 23, 1966 [id number] Brace completed by bio-tech vendor Patient ID: [name], female   DOB: 1966-11-01, 48 y.o.   MRN: [id number]   [name] 10/21/2014, 5:00 PM

## 2014-10-21 NOTE — Progress Notes (Signed)
I discussed with the patient the risks and benefits of surgery for her left calcaneus, including the possibility of infection, nerve injury, vessel injury, wound breakdown, arthritis, symptomatic hardware, DVT/ PE, loss of motion, and need for further surgery among others.  She understood these risks and wished to proceed.   Lauren Box, MD 10/21/2014 7:54 AM
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
## 2014-10-21 NOTE — Care Management Note (Signed)
CARE MANAGEMENT NOTE 10/21/2014  Patient:  Lauren Fuentes, Lauren Fuentes   Account Number:  [id number]  Date Initiated:  10/21/2014  Documentation initiated by:  Ricki Miller  Subjective/Objective Assessment:   48 yr old female admitted with a left calcaneal fracture. Patient had a left calcaneal ORIF.     Action/Plan:   PT/OT eval.  Case manager will continue to monitor.   Anticipated DC Date:     Anticipated DC Plan:           Choice offered to / List presented to:             Status of service:  In process, will continue to follow Medicare Important Message given?  YES (If response is "NO", the following Medicare IM given date fields will be blank) Date Medicare IM given:  10/21/2014 Medicare IM given by:  Ricki Miller Date Additional Medicare IM given:   Additional Medicare IM given by:    Discharge Disposition:    Per UR Regulation:  Reviewed for med. necessity/level of care/duration of stay  If discussed at [place] of Stay Meetings, dates discussed:    Comments:

## 2014-10-21 NOTE — Progress Notes (Signed)
Orthopedic Tech Progress Note Patient Details:  Lauren Fuentes February 23, 1966 [id number] Patient unable to use OHF due to back condition. Use of OHF would be contraindicative.  Patient ID: Lauren Fuentes, female   DOB: [birth year], 48 y.o.   MRN: [id number]   Lauren Fuentes 10/21/2014, 12:14 PM

## 2014-10-21 NOTE — Anesthesia Preprocedure Evaluation (Addendum)
Anesthesia Evaluation  Patient identified by MRN, date of birth, ID band Patient awake    Reviewed: Allergy & Precautions, H&P , NPO status , Patient's Chart, lab work & pertinent test results  Airway Mallampati: II  TM Distance: >3 FB Neck ROM: Full    Dental  (+) Dental Advisory Given   Pulmonary Current Smoker,  breath sounds clear to auscultation        Cardiovascular negative cardio ROS  Rhythm:Regular Rate:Normal     Neuro/Psych Bipolar Disorder Schizophrenia negative neurological ROS     GI/Hepatic negative GI ROS, (+)     substance abuse  alcohol use and cocaine use, Mild elevations of AST and ALT   Endo/Other  negative endocrine ROS  Renal/GU negative Renal ROS     Musculoskeletal negative musculoskeletal ROS (+)   Abdominal   Peds  Hematology negative hematology ROS (+)   Anesthesia Other Findings   Reproductive/Obstetrics                            Anesthesia Physical Anesthesia Plan  ASA: II  Anesthesia Plan: General   Post-op Pain Management:    Induction: Intravenous  Airway Management Planned: LMA  Additional Equipment:   Intra-op Plan:   Post-operative Plan:   Informed Consent: I have reviewed the patients History and Physical, chart, labs and discussed the procedure including the risks, benefits and alternatives for the proposed anesthesia with the patient or authorized representative who has indicated his/her understanding and acceptance.   Dental advisory given  Plan Discussed with: CRNA and Surgeon  Anesthesia Plan Comments:         Anesthesia Quick Evaluation

## 2014-10-22 ENCOUNTER — Encounter (HOSPITAL_COMMUNITY): Payer: Self-pay | Admitting: Orthopedic Surgery

## 2014-10-22 DIAGNOSIS — S22000A Wedge compression fracture of unspecified thoracic vertebra, initial encounter for closed fracture: Secondary | ICD-10-CM

## 2014-10-22 DIAGNOSIS — F319 Bipolar disorder, unspecified: Secondary | ICD-10-CM | POA: Diagnosis present

## 2014-10-22 DIAGNOSIS — S92301A Fracture of unspecified metatarsal bone(s), right foot, initial encounter for closed fracture: Secondary | ICD-10-CM

## 2014-10-22 DIAGNOSIS — W139XXA Fall from, out of or through building, not otherwise specified, initial encounter: Secondary | ICD-10-CM | POA: Diagnosis present

## 2014-10-22 DIAGNOSIS — S32000A Wedge compression fracture of unspecified lumbar vertebra, initial encounter for closed fracture: Secondary | ICD-10-CM

## 2014-10-22 DIAGNOSIS — F259 Schizoaffective disorder, unspecified: Secondary | ICD-10-CM | POA: Diagnosis present

## 2014-10-22 HISTORY — DX: Wedge compression fracture of unspecified thoracic vertebra, initial encounter for closed fracture: S22.000A

## 2014-10-22 HISTORY — DX: Wedge compression fracture of unspecified lumbar vertebra, initial encounter for closed fracture: S32.000A

## 2014-10-22 HISTORY — DX: Fracture of unspecified metatarsal bone(s), right foot, initial encounter for closed fracture: S92.301A

## 2014-10-22 MED ORDER — DSS 100 MG PO CAPS
100.0000 mg | ORAL_CAPSULE | Freq: Two times a day (BID) | ORAL | Status: DC
Start: 2014-10-22 — End: 2015-02-18

## 2014-10-22 MED ORDER — OXYCODONE-ACETAMINOPHEN 5-325 MG PO TABS: 1.0000 | ORAL_TABLET | Freq: Four times a day (QID) | ORAL | Status: DC | PRN

## 2014-10-22 MED ORDER — OXYCODONE HCL 5 MG PO TABS: 5.0000 mg | ORAL_TABLET | Freq: Four times a day (QID) | ORAL | Status: DC | PRN

## 2014-10-22 MED ORDER — ASPIRIN EC 325 MG PO TBEC: 325.0000 mg | DELAYED_RELEASE_TABLET | Freq: Two times a day (BID) | ORAL | Status: DC

## 2014-10-22 NOTE — Discharge Instructions (Addendum)
[facility] Service Discharge Instructions   General Discharge Instructions  WEIGHT BEARING STATUS: Nonweightbearing Left leg, WBAT R leg through heel with post op shoe.  TLSO brace must be on for mobilization as well   Wound care: okay to change dressing on right foot as needed. Do not remove splint on left leg  Diet: as you were eating previously.  Can use over the counter stool softeners and bowel preparations, such as Miralax, to help with bowel movements.  Narcotics can be constipating.  Be sure to drink plenty of fluids  STOP SMOKING OR USING NICOTINE PRODUCTS!!!!  As discussed nicotine severely impairs your body's ability to heal surgical and traumatic wounds but also impairs bone healing.  Wounds and bone heal by forming microscopic blood vessels (angiogenesis) and nicotine is a vasoconstrictor (essentially, shrinks blood vessels).  Therefore, if vasoconstriction occurs to these microscopic blood vessels they essentially disappear and are unable to deliver necessary nutrients to the healing tissue.  This is one modifiable factor that you can do to dramatically increase your chances of healing your injury.    (This means no smoking, no nicotine gum, patches, etc)  DO NOT USE NONSTEROIDAL ANTI-INFLAMMATORY DRUGS (NSAID'S)  Using products such as Advil (ibuprofen), Aleve (naproxen), Motrin (ibuprofen) for additional pain control during fracture healing can delay and/or prevent the healing response.  If you would like to take over the counter (OTC) medication, Tylenol (acetaminophen) is ok.  However, some narcotic medications that are given for pain control contain acetaminophen as well. Therefore, you should not exceed more than 4000 mg of tylenol in a day if you do not have liver disease.  Also note that there are may OTC medicines, such as cold medicines and allergy medicines that my contain tylenol as well.  If you have any questions about medications and/or interactions please ask  your doctor/PA or your pharmacist.   PAIN MEDICATION USE AND EXPECTATIONS  You have likely been given narcotic medications to help control your pain.  After a traumatic event that results in an fracture (broken bone) with or without surgery, it is ok to use narcotic pain medications to help control one's pain.  We understand that everyone responds to pain differently and each individual patient will be evaluated on a regular basis for the continued need for narcotic medications. Ideally, narcotic medication use should last no more than 6-8 weeks (coinciding with fracture healing).   As a patient it is your responsibility as well to monitor narcotic medication use and report the amount and frequency you use these medications when you come to your office visit.   We would also advise that if you are using narcotic medications, you should take a dose prior to therapy to maximize you participation.  IF YOU ARE ON NARCOTIC MEDICATIONS IT IS NOT PERMISSIBLE TO OPERATE A MOTOR VEHICLE (MOTORCYCLE/CAR/TRUCK/MOPED) OR HEAVY MACHINERY DO NOT MIX NARCOTICS WITH OTHER CNS (CENTRAL NERVOUS SYSTEM) DEPRESSANTS SUCH AS ALCOHOL       ICE AND ELEVATE INJURED/OPERATIVE EXTREMITY  Using ice and elevating the injured extremity above your heart can help with swelling and pain control.  Icing in a pulsatile fashion, such as 20 minutes on and 20 minutes off, can be followed.    Do not place ice directly on skin. Make sure there is a barrier between to skin and the ice pack.    Using frozen items such as frozen peas works well as the conform nicely to the are that needs to be iced.  USE AN ACE WRAP OR TED HOSE FOR SWELLING CONTROL  In addition to icing and elevation, Ace wraps or TED hose are used to help limit and resolve swelling.  It is recommended to use Ace wraps or TED hose until you are informed to stop.    When using Ace Wraps start the wrapping distally (farthest away from the body) and wrap proximally (closer  to the body)   Example: If you had surgery on your leg or thing and you do not have a splint on, start the ace wrap at the toes and work your way up to the thigh        If you had surgery on your upper extremity and do not have a splint on, start the ace wrap at your fingers and work your way up to the upper arm  IF YOU ARE IN A SPLINT OR CAST DO NOT [place]   If your splint gets wet for any reason please contact the office immediately. You may shower in your splint or cast as long as you keep it dry.  This can be done by wrapping in a cast cover or garbage back (or similar)  Do Not stick any thing down your splint or cast such as pencils, money, or hangers to try and scratch yourself with.  If you feel itchy take benadryl as prescribed on the bottle for itching  IF YOU ARE IN A CAM BOOT (BLACK BOOT)  You may remove boot periodically. Perform daily dressing changes as noted below.  Wash the liner of the boot regularly and wear a sock when wearing the boot. It is recommended that you sleep in the boot until told otherwise  CALL THE OFFICE WITH ANY QUESTIONS OR CONCERTS: [phone]     Discharge Pin Site Instructions  Dress pins daily with Kerlix roll starting on POD 2. Wrap the Kerlix so that it tamps the skin down around the pin-skin interface to prevent/limit motion of the skin relative to the pin.  (Pin-skin motion is the primary cause of pain and infection related to external fixator pin sites).  Remove any crust or coagulum that may obstruct drainage with a saline moistened gauze or soap and water.  After POD 3, if there is no discernable drainage on the pin site dressing, the interval for change can by increased to every other day.  You may shower with the fixator, cleaning all pin sites gently with soap and water.  If you have a surgical wound this needs to be completely dry and without drainage before showering.  The extremity can be lifted by the fixator to  facilitate wound care and transfers.  Notify the office/Doctor if you experience increasing drainage, redness, or pain from a pin site, or if you notice purulent (thick, snot-like) drainage.  Discharge Wound Care Instructions  Do NOT apply any ointments, solutions or lotions to pin sites or surgical wounds.  These prevent needed drainage and even though solutions like hydrogen peroxide kill bacteria, they also damage cells lining the pin sites that help fight infection.  Applying lotions or ointments can keep the wounds moist and can cause them to breakdown and open up as well. This can increase the risk for infection. When in doubt call the office.  Surgical incisions should be dressed daily.  If any drainage is noted, use one layer of adaptic, then gauze, Kerlix, and an ace wrap.  Once the incision is completely dry and without drainage, it may  be left open to air out.  Showering may begin 36-48 hours later.  Cleaning gently with soap and water.  Traumatic wounds should be dressed daily as well.    One layer of adaptic, gauze, Kerlix, then ace wrap.  The adaptic can be discontinued once the draining has ceased    If you have a wet to dry dressing: wet the gauze with saline the squeeze as much saline out so the gauze is moist (not soaking wet), place moistened gauze over wound, then place a dry gauze over the moist one, followed by Kerlix wrap, then ace wrap.

## 2014-10-22 NOTE — Progress Notes (Signed)
MRI reviewed No evidence of hematoma from fracture or lower lumbar disc pathology. Minor disc protrusion but no frank herniation Fx's demonstrate minimal compression Recommend brace treatment for now If pain or deformity worsen then consider kyphoplasty Will sign off Patient to f/u with me in 4 weeks for repeat xrays.
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
## 2014-10-22 NOTE — Progress Notes (Signed)
Orthopedic Tech Progress Note Patient Details:  Lauren Fuentes Jan 13, 1966 [id number] Post op shoe applied to RLE Ortho Devices Type of Ortho Device: Postop shoe/boot Ortho Device/Splint Location: RLE Ortho Device/Splint Interventions: Application   Asia R Thompson 10/22/2014, 11:59 AM
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
## 2014-10-22 NOTE — Clinical Social Work Placement (Addendum)
Clinical Social Work Department CLINICAL SOCIAL WORK PLACEMENT NOTE 10/22/2014  Patient:  Lauren Fuentes, Lauren Fuentes  Account Number:  [id number] Admit date:  10/19/2014  Clinical Social Worker:  Wylene Men  Date/time:  10/22/2014 01:49 PM  Clinical Social Work is seeking post-discharge placement for this patient at the following level of care:   SKILLED NURSING   (*CSW will update this form in Epic as items are completed)   10/22/2014  Patient/family provided with [place] Department of Clinical Social Work's list of facilities offering this level of care within the geographic area requested by the patient (or if unable, by the patient's family).  10/22/2014  Patient/family informed of their freedom to choose among providers that offer the needed level of care, that participate in Medicare, Medicaid or managed care program needed by the patient, have an available bed and are willing to accept the patient.  10/22/2014  Patient/family informed of [facility]' ownership interest in [facility], as well as of the fact that they are under no obligation to receive care at this facility.  PASARR submitted to EDS on 10/22/2014 PASARR number received on 10/22/2014  FL2 transmitted to all facilities in geographic area requested by pt/family on  10/22/2014 FL2 transmitted to all facilities within larger geographic area on   Patient informed that his/her managed care company has contracts with or will negotiate with  certain facilities, including the following:     Patient/family informed of bed offers received:  10/25/2014 Patient chooses bed at [facility] Physician recommends and patient chooses bed at  n/a  Patient to be transferred to  [facility] on  10/25/2014 Patient to be transferred to facility by [facility] Patient and family notified of transfer on 10/25/2014 Name of family member notified:  No family notified per patient request.  Patient is alert and  oriented and makes her own medical decisions.  Pt is agreeable to plan of dc.  The following physician request were entered in Epic:   Additional Comments:  Nonnie Done, [place] [phone]  Psychiatric & Orthopedics (5N 1-16) Clinical Social Worker
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
## 2014-10-23 MED ORDER — BISACODYL 10 MG RE SUPP
10.0000 mg | Freq: Every day | RECTAL | Status: DC | PRN
  Administered 2014-10-23 – 2014-10-25 (×3): 10 mg via RECTAL
  Filled 2014-10-23 (×3): qty 1

## 2014-10-23 NOTE — Progress Notes (Addendum)
Subjective: 2 Days Post-Op Procedure(s) (LRB): OPEN REDUCTION INTERNAL FIXATION (ORIF) LEFT CALCANEOUS FRACTURE (Left) Patient reports pain as 3 on 0-10 scale.  No nausea/vomiting, lightheadedness/dizziness.  No shortness of breath or chest pain.  Positive flatus but no bm x 5 days.  Objective: Vital signs in last 24 hours: Temp:  [98.1 F (36.7 C)-99.2 F (37.3 C)] 99 F (37.2 C) (10/31 0518) Pulse Rate:  [73-96] 75 (10/31 0518) Resp:  [18] 18 (10/31 0518) BP: (92-124)/(51-55) 124/55 mmHg (10/31 0518) SpO2:  [95 %-98 %] 97 % (10/31 0518)  Intake/Output from previous day: 10/30 0701 - 10/31 0700 In: 520 [P.O.:520] Out: 1050 [Urine:1050] Intake/Output this shift:     Recent Labs  10/21/14 1213  HGB 11.7*    Recent Labs  10/21/14 1213  WBC 7.0  RBC 3.81*  HCT 35.9*  PLT 202    Recent Labs  10/21/14 1213  CREATININE 0.84   No results found for this basename: LABPT, INR,  in the last 72 hours  Neurologically intact Neurovascularly intact Sensation intact distally Splint in place LLE Dressing and post-op shoe in place RLE.  No drainage through dressing Decreased swelling RLE  Assessment/Plan: 2 Days Post-Op Procedure(s) (LRB): OPEN REDUCTION INTERNAL FIXATION (ORIF) LEFT CALCANEOUS FRACTURE (Left) Advance diet LLE NWB x 8 weeks Splint x 2 weeks Toe motion as tolerated  RLE WBAT thru heel Continue post-op shoe  T12-L2 fxrs Continue TLSO brace  Will order dulcolax suppository for constipation  D/c to SNF when bed available.  Most likely Sunday or Monday   ANTON, M. LINDSEY 10/23/2014, 10:37 AM

## 2014-10-24 NOTE — Plan of Care (Signed)
Problem: Phase I Progression Outcomes Goal: Pain controlled with appropriate interventions Outcome: Completed/Met Date Met:  10/24/14 Goal: OOB as tolerated unless otherwise ordered Outcome: Completed/Met Date Met:  10/24/14 Goal: Initial discharge plan identified Outcome: Completed/Met Date Met:  10/24/14 Goal: Hemodynamically stable Outcome: Completed/Met Date Met:  10/24/14 Goal: Other Phase I Outcomes/Goals Outcome: Completed/Met Date Met:  10/24/14

## 2014-10-24 NOTE — Progress Notes (Signed)
Subjective: 3 Days Post-Op Procedure(s) (LRB): OPEN REDUCTION INTERNAL FIXATION (ORIF) LEFT CALCANEOUS FRACTURE (Left) Patient reports pain as 1 on 0-10 scale.  Patient reports some lightheadedness which she says is associated with severe anxiety.  She is really stressing the importance of being d/c to somewhere "safe" for a long period of time.  Otherwise, no nausea/vomiting.  Positive flatus and bm.  Tolerating diet.  Objective: Vital signs in last 24 hours: Temp:  [98.1 F (36.7 C)-98.8 F (37.1 C)] 98.1 F (36.7 C) (11/01 0718) Pulse Rate:  [79-84] 80 (11/01 0718) Resp:  [16-18] 16 (11/01 0718) BP: (113-119)/(46-71) 113/71 mmHg (11/01 0718) SpO2:  [94 %-98 %] 98 % (11/01 0718)  Intake/Output from previous day: 10/31 0701 - 11/01 0700 In: 1120 [P.O.:1120] Out: 500 [Urine:500] Intake/Output this shift:     Recent Labs  10/21/14 1213  HGB 11.7*    Recent Labs  10/21/14 1213  WBC 7.0  RBC 3.81*  HCT 35.9*  PLT 202    Recent Labs  10/21/14 1213  CREATININE 0.84   No results for input(s): LABPT, INR in the last 72 hours.  Neurologically intact Neurovascular intact Sensation intact distally Intact pulses distally Dorsiflexion/Plantar flexion intact Compartment soft  LLE splint in place Bandage and post-op shoe in place RLE TLSO in place  Assessment/Plan: 3 Days Post-Op Procedure(s) (LRB): OPEN REDUCTION INTERNAL FIXATION (ORIF) LEFT CALCANEOUS FRACTURE (Left) Advance diet Up with therapy Discharge to SNF when bed available  LLE Continue with splint NWB-toe motion as tolerated  RLE Continue with ace bandage and post-op shoe WBAT thru heel  T12-L2 fractures Continue with TLSO brace  ANTON, M. LINDSEY 10/24/2014, 10:54 AM

## 2014-10-25 ENCOUNTER — Encounter (HOSPITAL_COMMUNITY): Payer: Self-pay | Admitting: Orthopedic Surgery

## 2014-10-25 DIAGNOSIS — R21 Rash and other nonspecific skin eruption: Secondary | ICD-10-CM | POA: Diagnosis not present

## 2014-10-25 DIAGNOSIS — L500 Allergic urticaria: Secondary | ICD-10-CM | POA: Diagnosis not present

## 2014-10-25 DIAGNOSIS — R262 Difficulty in walking, not elsewhere classified: Secondary | ICD-10-CM | POA: Diagnosis not present

## 2014-10-25 DIAGNOSIS — L299 Pruritus, unspecified: Secondary | ICD-10-CM | POA: Diagnosis not present

## 2014-10-25 DIAGNOSIS — F419 Anxiety disorder, unspecified: Secondary | ICD-10-CM | POA: Diagnosis not present

## 2014-10-25 DIAGNOSIS — F329 Major depressive disorder, single episode, unspecified: Secondary | ICD-10-CM | POA: Diagnosis not present

## 2014-10-25 DIAGNOSIS — M150 Primary generalized (osteo)arthritis: Secondary | ICD-10-CM | POA: Diagnosis not present

## 2014-10-25 DIAGNOSIS — F250 Schizoaffective disorder, bipolar type: Secondary | ICD-10-CM | POA: Diagnosis not present

## 2014-10-25 DIAGNOSIS — S329XXA Fracture of unspecified parts of lumbosacral spine and pelvis, initial encounter for closed fracture: Secondary | ICD-10-CM | POA: Diagnosis not present

## 2014-10-25 DIAGNOSIS — F339 Major depressive disorder, recurrent, unspecified: Secondary | ICD-10-CM | POA: Diagnosis not present

## 2014-10-25 DIAGNOSIS — F319 Bipolar disorder, unspecified: Secondary | ICD-10-CM | POA: Diagnosis not present

## 2014-10-25 DIAGNOSIS — S92909A Unspecified fracture of unspecified foot, initial encounter for closed fracture: Secondary | ICD-10-CM | POA: Diagnosis not present

## 2014-10-25 DIAGNOSIS — T1490 Injury, unspecified: Secondary | ICD-10-CM | POA: Diagnosis not present

## 2014-10-25 DIAGNOSIS — S92002D Unspecified fracture of left calcaneus, subsequent encounter for fracture with routine healing: Secondary | ICD-10-CM | POA: Diagnosis not present

## 2014-10-25 DIAGNOSIS — Z8781 Personal history of (healed) traumatic fracture: Secondary | ICD-10-CM | POA: Diagnosis not present

## 2014-10-25 DIAGNOSIS — S32020D Wedge compression fracture of second lumbar vertebra, subsequent encounter for fracture with routine healing: Secondary | ICD-10-CM | POA: Diagnosis not present

## 2014-10-25 DIAGNOSIS — S86109A Unspecified injury of other muscle(s) and tendon(s) of posterior muscle group at lower leg level, unspecified leg, initial encounter: Secondary | ICD-10-CM | POA: Diagnosis not present

## 2014-10-25 DIAGNOSIS — Z7982 Long term (current) use of aspirin: Secondary | ICD-10-CM | POA: Diagnosis not present

## 2014-10-25 DIAGNOSIS — M6281 Muscle weakness (generalized): Secondary | ICD-10-CM | POA: Diagnosis not present

## 2014-10-25 DIAGNOSIS — F4310 Post-traumatic stress disorder, unspecified: Secondary | ICD-10-CM | POA: Diagnosis not present

## 2014-10-25 DIAGNOSIS — S92341D Displaced fracture of fourth metatarsal bone, right foot, subsequent encounter for fracture with routine healing: Secondary | ICD-10-CM | POA: Diagnosis not present

## 2014-10-25 DIAGNOSIS — K5900 Constipation, unspecified: Secondary | ICD-10-CM | POA: Diagnosis not present

## 2014-10-25 DIAGNOSIS — F209 Schizophrenia, unspecified: Secondary | ICD-10-CM | POA: Diagnosis not present

## 2014-10-25 DIAGNOSIS — L239 Allergic contact dermatitis, unspecified cause: Secondary | ICD-10-CM | POA: Diagnosis not present

## 2014-10-25 DIAGNOSIS — Z7952 Long term (current) use of systemic steroids: Secondary | ICD-10-CM | POA: Diagnosis not present

## 2014-10-25 DIAGNOSIS — T7840XA Allergy, unspecified, initial encounter: Secondary | ICD-10-CM | POA: Diagnosis not present

## 2014-10-25 DIAGNOSIS — Z79899 Other long term (current) drug therapy: Secondary | ICD-10-CM | POA: Diagnosis not present

## 2014-10-25 DIAGNOSIS — N949 Unspecified condition associated with female genital organs and menstrual cycle: Secondary | ICD-10-CM | POA: Diagnosis not present

## 2014-10-25 DIAGNOSIS — M5116 Intervertebral disc disorders with radiculopathy, lumbar region: Secondary | ICD-10-CM | POA: Diagnosis not present

## 2014-10-25 DIAGNOSIS — S80869A Insect bite (nonvenomous), unspecified lower leg, initial encounter: Secondary | ICD-10-CM | POA: Diagnosis not present

## 2014-10-25 MED ORDER — METHOCARBAMOL 500 MG PO TABS: 500.0000 mg | ORAL_TABLET | Freq: Four times a day (QID) | ORAL | Status: DC | PRN
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
## 2014-10-25 NOTE — Clinical Social Work Psych Note (Signed)
Patient to discharge to [facility] RN to call report to [phone] Transportation: [facility]  Patient is aware of today's discharge date due to MD order.  CSW reviewed discharge plans with patient.  Patient is aware and agreeable to plans.  RN made aware.    Nonnie Done, [place] [phone]  Psychiatric & Orthopedics (5N 1-16) Clinical Social Worker
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
## 2014-10-25 NOTE — Progress Notes (Addendum)
D/C in stable condition with copy of chart to [facility]. Report called to receiving nurse.

## 2014-10-28 DIAGNOSIS — F319 Bipolar disorder, unspecified: Secondary | ICD-10-CM | POA: Diagnosis not present

## 2014-10-28 DIAGNOSIS — K5900 Constipation, unspecified: Secondary | ICD-10-CM | POA: Diagnosis not present

## 2014-10-28 DIAGNOSIS — F419 Anxiety disorder, unspecified: Secondary | ICD-10-CM | POA: Diagnosis not present

## 2014-10-28 DIAGNOSIS — S92002D Unspecified fracture of left calcaneus, subsequent encounter for fracture with routine healing: Secondary | ICD-10-CM | POA: Diagnosis not present

## 2014-11-08 ENCOUNTER — Encounter (HOSPITAL_COMMUNITY): Payer: Self-pay | Admitting: Emergency Medicine

## 2014-11-08 ENCOUNTER — Emergency Department (HOSPITAL_COMMUNITY)
Admission: EM | Admit: 2014-11-08 | Discharge: 2014-11-08 | Disposition: A | Discharge status: Home / Self Care | Payer: Medicare Other | Attending: Emergency Medicine | Admitting: Emergency Medicine

## 2014-11-08 DIAGNOSIS — R21 Rash and other nonspecific skin eruption: Secondary | ICD-10-CM | POA: Insufficient documentation

## 2014-11-08 DIAGNOSIS — T50905A Adverse effect of unspecified drugs, medicaments and biological substances, initial encounter: Secondary | ICD-10-CM

## 2014-11-08 DIAGNOSIS — Z7982 Long term (current) use of aspirin: Secondary | ICD-10-CM | POA: Insufficient documentation

## 2014-11-08 DIAGNOSIS — S80869A Insect bite (nonvenomous), unspecified lower leg, initial encounter: Secondary | ICD-10-CM | POA: Diagnosis not present

## 2014-11-08 DIAGNOSIS — L500 Allergic urticaria: Secondary | ICD-10-CM | POA: Insufficient documentation

## 2014-11-08 DIAGNOSIS — Z8781 Personal history of (healed) traumatic fracture: Secondary | ICD-10-CM | POA: Insufficient documentation

## 2014-11-08 DIAGNOSIS — Z79899 Other long term (current) drug therapy: Secondary | ICD-10-CM | POA: Insufficient documentation

## 2014-11-08 DIAGNOSIS — Z7952 Long term (current) use of systemic steroids: Secondary | ICD-10-CM | POA: Insufficient documentation

## 2014-11-08 DIAGNOSIS — L239 Allergic contact dermatitis, unspecified cause: Secondary | ICD-10-CM | POA: Diagnosis not present

## 2014-11-08 DIAGNOSIS — L299 Pruritus, unspecified: Secondary | ICD-10-CM | POA: Diagnosis not present

## 2014-11-08 DIAGNOSIS — T7840XA Allergy, unspecified, initial encounter: Secondary | ICD-10-CM | POA: Insufficient documentation

## 2014-11-08 DIAGNOSIS — F319 Bipolar disorder, unspecified: Secondary | ICD-10-CM | POA: Insufficient documentation

## 2014-11-08 DIAGNOSIS — F4310 Post-traumatic stress disorder, unspecified: Secondary | ICD-10-CM | POA: Insufficient documentation

## 2014-11-08 DIAGNOSIS — L298 Other pruritus: Secondary | ICD-10-CM

## 2014-11-08 MED ORDER — HYDROXYZINE HCL 25 MG PO TABS: 25.0000 mg | ORAL_TABLET | Freq: Four times a day (QID) | ORAL | Status: DC | PRN

## 2014-11-08 MED ORDER — HYDROXYZINE HCL 25 MG PO TABS
25.0000 mg | ORAL_TABLET | Freq: Once | ORAL | Status: AC
  Administered 2014-11-08: 25 mg via ORAL
  Filled 2014-11-08: qty 1

## 2014-11-08 MED ORDER — HYDROCORTISONE 1 % EX CREA
1.0000 "application " | TOPICAL_CREAM | Freq: Once | CUTANEOUS | Status: AC
  Administered 2014-11-08: 1 via TOPICAL
  Filled 2014-11-08: qty 28

## 2014-11-08 MED ORDER — HYDROCORTISONE 1 % EX CREA: 1.0000 "application " | TOPICAL_CREAM | Freq: Two times a day (BID) | CUTANEOUS | Status: DC | PRN
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
## 2014-11-08 NOTE — ED Notes (Signed)
[facility] contacted for pt transport. Report called to facility.

## 2014-11-08 NOTE — Discharge Instructions (Signed)
Take Vistaril and hydrocortisone lotion as prescribed. Continue your usual home medications but discontinue any of the new medications given today to avoid another allergic reaction such as the one you experienced today. May take over the counter zantac 163m twice daily to help with rash. Get plenty of rest and drink plenty of fluids. Avoid any known triggers. Please followup with your primary doctor for discussion of your diagnoses and further evaluation after today's visit; if you do not have a primary care doctor use the resource guide provided to find one; followup with dermatology as needed. Return to the ER for changes or worsening symptoms.    Drug Allergy A drug allergy means you have a strange reaction to a medicine. You may have puffiness (swelling), itching, red rashes, and hives. Some allergic reactions can be life-threatening. HOME CARE  If you do not know what caused your reaction:  Write down medicines you use.  Write down any problems you have after using medicine.  Avoid things that cause a reaction.  You can see an allergy doctor to be tested for allergies. If you have hives or a rash:  Take medicine as told by your doctor.  Place cold cloths on your skin.  Do not take hot baths or hot showers. Take baths in cool water. If you are severely allergic:  Wear a medical bracelet or necklace that lists your allergy.  Carry your allergy kit or medicine shot to treat severe allergic reactions with you. These can save your life.  Do not drive until medicine from your shot has worn off, unless your doctor says it is okay. GET HELP RIGHT AWAY IF:   Your mouth is puffy, or you have trouble breathing.  You have a tight feeling in your chest or throat.  You have hives, puffiness, or itching all over your body.  You throw up (vomit) or have watery poop (diarrhea).  You feel dizzy or pass out (faint).  You think you are having a reaction. Problems often start within 30  minutes after taking a medicine.  You are getting worse, not better.  You have new problems.  Your problems go away and then come back. This is an emergency. Use your medicine shot or allergy kit as told. Call yourlocal emergency services (911 in [place] after the shot. Even if you feel better after the shot, you need to go to the hospital. You may need more medicine to control a severe reaction. MAKE SURE YOU:  Understand these instructions.  Will watch your condition.  Will get help right away if you are not doing well or get worse. Document Released: 01/17/2005 Document Revised: 03/03/2012 Document Reviewed: 06/07/2011 [facility] Information 2015 [place] This information is not intended to replace advice given to you by your health care provider. Make sure you discuss any questions you have with your health care provider.  Drug Rash Skin reactions can be caused by several different drugs. Allergy to the medicine can cause itching, hives, and other rashes. Sun exposure causes a red rash with some medicines. Mononucleosis virus can cause a similar red rash when you are taking antibiotics. Sometimes, the rash may be accompanied by pain. The drug rash may happen with new drugs or with medicines that you have been taking for a while. The rash cannot be spread from person to person. In most cases, the symptoms of a drug rash are gone within a few days of stopping the medicine. Your rash, including hives (urticaria), is most  likely from the following medicines:  Antibiotics or antimicrobials.  Anticonvulsants or seizure medicines.  Antihypertensives or blood pressure medicines.  Antimalarials.  Antidepressants or depression medicines.  Antianxiety drugs.  Diuretics or water pills.  Nonsteroidal anti-inflammatory drugs.  Simvastatin.  Lithium.  Omeprazole.  Allopurinol.  Pseudoephedrine.  Amiodarone.  Packed red blood cells, when you get a blood  transfusion.  Contrast media, such as when getting an imaging test (CT or CAT scan). This drug list is not all inclusive, but drug rashes have been reported with all the medicines listed above. Your caregiver will tell you which medicines to avoid. If you react to a medicine, a similar or worse reaction can occur the next time you take it. If you need to stop taking an antibiotic because of a drug rash, an alternative antibiotic may be needed to get rid of your infection. Antihistamine or cortisone drugs may be prescribed to help relieve your symptoms. Stay out of the sun until the rash is completely gone.  Be sure to let your caregiver know about your drug reaction. Do not take this medicine in the future. Call your caregiver if your drug rash does not improve within 3 to 4 days. SEEK IMMEDIATE MEDICAL CARE IF:   You develop breathing problems, swelling in the throat, or wheezing.  You have weakness, fainting, fever, and muscle or joint pains.  You develop blisters or peeling of skin, especially around the mouth. Document Released: 01/17/2005 Document Revised: 04/26/2014 Document Reviewed: 10/28/2008 [facility] Patient Information 2015 [place], [place]. This information is not intended to replace advice given to you by your health care provider. Make sure you discuss any questions you have with your health care provider.

## 2014-11-08 NOTE — ED Provider Notes (Signed)
CSN: [id number]     Arrival date & time 11/08/14  1759 History   First MD Initiated Contact with Patient 11/08/14 1932     Chief Complaint  Patient presents with  . Rash     (Consider location/radiation/quality/duration/timing/severity/associated sxs/prior Treatment) HPI Comments: Lauren Fuentes is a 48 y.o. female with a PMHx of bipolar 1 disorder, schizoaffective disorder, PTSD, L calcaneal fx, thoracic and lumbar fx, and R foot metatarsal fx after a fall at the end of Oct 2015, who presents to the ED via EMS from [facility] with complaints of generalized all-over body rash that began today after she took what she believes is a new medication and started a new lotion. Patient states that the nurse at her health care facility gave her her medications today, but dropped one in the trash can, and when she picked it out of the trash can and took it she had noticed that she did not recognize the medication. She is unsure what the medication was. At the same time she was also given a lotion that she describes as a green gel and smelled of menthol, but unsure of what medication it was, and she put this over her skin. A short time later she developed an itchy generalized rash with what she describes as welts all over her extremities and torso. She states that the rash was red, splotchy, and itched. She denies any areas of induration or fluctuance, no weeping areas. She was given Benadryl in route with some mild relief of symptoms. She denies any fevers, chills, tongue or lip swelling, chest pain, shortness of breath, wheezing, stridor, difficulty swallowing, facial swelling, abdominal pain, nausea, vomiting, diarrhea, constipation, myalgias, arthralgias, or syncope. She is unsure of any new soaps or detergents, stating that her clothes are washed out of the facility that she stays at. She has not had any exposure to any other rashes similar to this. She is unsure if she had any insect bites,  stating that the healthcare facility is known to have spiders, but does not recall any bites of this nature.  Patient is a 48 y.o. female presenting with rash. The history is provided by the patient. No language interpreter was used.  Rash Location:  Full body Quality: itchiness and redness   Quality: not dry, not painful, not peeling and not weeping   Severity:  Moderate Onset quality:  Sudden Duration:  5 hours Timing:  Constant Progression:  Improving Chronicity:  New Context: insect bite/sting (unsure) and medications (unsure if new med given today, new lotion started today)   Relieved by:  Antihistamines Worsened by:  Nothing tried Ineffective treatments:  None tried Associated symptoms: no abdominal pain, no diarrhea, no fatigue, no fever, no headaches, no hoarse voice, no induration, no joint pain, no myalgias, no nausea, no periorbital edema, no shortness of breath, no sore throat, no throat swelling, no tongue swelling, no URI, not vomiting and not wheezing     Past Medical History  Diagnosis Date  . Bipolar 1 disorder   . Schizoaffective disorder   . Post traumatic stress disorder (PTSD)   . Calcaneal fracture, Left  10/19/2014  . Thoracic compression fracture, T12 endplate  10/93/2355  . Lumbar compression fracture, L1-2 endplate  73/22/0254  . Fracture of metatarsal bone of right foot, 4th neck 10/22/2014   Past Surgical History  Procedure Laterality Date  . Tubal ligation    . Orif calcaneous fracture Left 10/21/2014    Procedure: OPEN REDUCTION INTERNAL  FIXATION (ORIF) LEFT CALCANEOUS FRACTURE;  Surgeon: Rozanna Box, MD;  Location: [facility];  Service: Orthopedics;  Laterality: Left;   No family history on file. History  Substance Use Topics  . Smoking status: Never Smoker   . Smokeless tobacco: Not on file  . Alcohol Use: Yes     Comment: rare   OB History    No data available     Review of Systems  Constitutional: Negative for fever and fatigue.   HENT: Negative for drooling, hoarse voice, sore throat and trouble swallowing.   Respiratory: Negative for cough, chest tightness, shortness of breath, wheezing and stridor.   Cardiovascular: Negative for chest pain.  Gastrointestinal: Negative for nausea, vomiting, abdominal pain and diarrhea.  Musculoskeletal: Negative for myalgias, back pain and arthralgias.  Skin: Positive for rash.  Neurological: Negative for dizziness, syncope, weakness, light-headedness, numbness and headaches.  Psychiatric/Behavioral: Negative for confusion.   10 Systems reviewed and are negative for acute change except as noted in the HPI.    Allergies  Review of patient's allergies indicates no known allergies.  Home Medications   Prior to Admission medications   Medication Sig Start Date End Date Taking? Authorizing Provider  docusate sodium 100 MG CAPS Take 100 mg by mouth 2 (two) times daily. 10/22/14  Yes Jari Pigg, PA-C  methocarbamol (ROBAXIN) 500 MG tablet Take 1-2 tablets (500-1,000 mg total) by mouth every 6 (six) hours as needed for muscle spasms. 10/25/14  Yes Jari Pigg, PA-C  oxyCODONE-acetaminophen (PERCOCET/ROXICET) 5-325 MG per tablet Take 1-2 tablets by mouth every 6 (six) hours as needed for moderate pain. 10/22/14  Yes Jari Pigg, PA-C  aspirin EC 325 MG tablet Take 1 tablet (325 mg total) by mouth 2 (two) times daily. 10/22/14   Jari Pigg, PA-C  busPIRone (BUSPAR) 7.5 MG tablet Take 7.5 mg by mouth 3 (three) times daily.    Historical Provider, MD  FLUoxetine (PROZAC) 20 MG capsule Take 40 mg by mouth daily.    Historical Provider, MD  fluticasone (FLONASE) 50 MCG/ACT nasal spray Place 2 sprays into the nose daily.    Historical Provider, MD  lamoTRIgine (LAMICTAL) 100 MG tablet Take 100 mg by mouth 2 (two) times daily.    Historical Provider, MD  loxapine (LOXITANE) 5 MG capsule Take 5 mg by mouth at bedtime.    Historical Provider, MD  oxyCODONE (OXY IR/ROXICODONE) 5 MG immediate  release tablet Take 1-2 tablets (5-10 mg total) by mouth every 6 (six) hours as needed for breakthrough pain (take between percocet for breakthrough pain). 10/22/14   Jari Pigg, PA-C  traZODone (DESYREL) 150 MG tablet Take 300 mg by mouth at bedtime.    Historical Provider, MD  traZODone (DESYREL) 150 MG tablet Take 300 mg by mouth at bedtime.    Historical Provider, MD  valACYclovir (VALTREX) 1000 MG tablet Take 1,000 mg by mouth 2 (two) times daily.    Historical Provider, MD   BP 132/88 mmHg  Pulse 88  Temp(Src) 98.7 F (37.1 C) (Oral)  Resp 18  SpO2 98%  LMP 09/19/2014 Physical Exam  Constitutional: She is oriented to person, place, and time. Vital signs are normal. She appears well-developed and well-nourished.  Non-toxic appearance. No distress.  Afebrile, nontoxic, NAD  HENT:  Head: Normocephalic and atraumatic.  Nose: Nose normal.  Mouth/Throat: Oropharynx is clear and moist and mucous membranes are normal.  No tongue or lip swelling, no facial swelling or erythema  Eyes: Conjunctivae  and EOM are normal. Right eye exhibits no discharge. Left eye exhibits no discharge.  Neck: Normal range of motion. Neck supple.  Cardiovascular: Normal rate and intact distal pulses.   Pulmonary/Chest: Effort normal. No respiratory distress.  Speaking in full sentences, no resp distress or inc WOB.   Abdominal: Normal appearance. She exhibits no distension.  Musculoskeletal: Normal range of motion.  TLSO brace and b/l feet ace wraps in place. MAE x4  Neurological: She is alert and oriented to person, place, and time. She has normal strength. No sensory deficit.  Skin: Skin is warm, dry and intact. Rash noted. Rash is urticarial.  Diffuse urticarial rash over all exposed surfaces of skin, some excoriations noted. No insect bites. No fluctuance or induration to any locations, no weeping  Psychiatric: She has a normal mood and affect.  Nursing note and vitals reviewed.   ED Course   Procedures (including critical care time) Labs Review Labs Reviewed - No data to display  Imaging Review No results found.   EKG Interpretation None      MDM   Final diagnoses:  Rash due to allergy  Itching due to drug    48y/o female with rash after possible new med/lotion exposure today. Discussed vistaril and hydrocortisone use. No s/sx of anaphylaxis or angioedema, doubt need for prednisone or zantac or epipen. Will have her d/c any meds that were given today and attempt to find the trigger. Will have her see her PCP for ongoing f/up. I explained the diagnosis and have given explicit precautions to return to the ER including for any other new or worsening symptoms. The patient understands and accepts the medical plan as it's been dictated and I have answered their questions. Discharge instructions concerning home care and prescriptions have been given. The patient is STABLE and is discharged to home in good condition.  BP 132/88 mmHg  Pulse 88  Temp(Src) 98.7 F (37.1 C) (Oral)  Resp 18  SpO2 98%  LMP 09/19/2014  Meds ordered this encounter  Medications  . hydrOXYzine (ATARAX/VISTARIL) tablet 25 mg    Sig:   . hydrocortisone cream 1 % 1 application    Sig:   . hydrocortisone cream 1 %    Sig: Apply 1 application topically 2 (two) times daily as needed for itching.    Dispense:  15 g    Refill:  0    Order Specific Question:  Supervising Provider    Answer:  Noemi Chapel D [[id number]]  . hydrOXYzine (ATARAX/VISTARIL) 25 MG tablet    Sig: Take 1 tablet (25 mg total) by mouth every 6 (six) hours as needed for itching.    Dispense:  28 tablet    Refill:  0    Order Specific Question:  Supervising Provider    Answer:  Johnna Acosta [address] [place], PA-C 11/08/14 2059  Debby Freiberg, MD 11/10/14 [phone]

## 2014-11-09 DIAGNOSIS — S32020D Wedge compression fracture of second lumbar vertebra, subsequent encounter for fracture with routine healing: Secondary | ICD-10-CM | POA: Diagnosis not present

## 2014-11-10 DIAGNOSIS — F419 Anxiety disorder, unspecified: Secondary | ICD-10-CM | POA: Diagnosis not present

## 2014-11-10 DIAGNOSIS — F4310 Post-traumatic stress disorder, unspecified: Secondary | ICD-10-CM | POA: Diagnosis not present

## 2014-11-10 DIAGNOSIS — F329 Major depressive disorder, single episode, unspecified: Secondary | ICD-10-CM | POA: Diagnosis not present

## 2014-11-10 DIAGNOSIS — F250 Schizoaffective disorder, bipolar type: Secondary | ICD-10-CM | POA: Diagnosis not present

## 2014-11-15 DIAGNOSIS — S92002D Unspecified fracture of left calcaneus, subsequent encounter for fracture with routine healing: Secondary | ICD-10-CM | POA: Diagnosis not present

## 2014-11-15 DIAGNOSIS — S92341D Displaced fracture of fourth metatarsal bone, right foot, subsequent encounter for fracture with routine healing: Secondary | ICD-10-CM | POA: Diagnosis not present

## 2014-11-16 DIAGNOSIS — R21 Rash and other nonspecific skin eruption: Secondary | ICD-10-CM | POA: Diagnosis not present

## 2014-11-17 DIAGNOSIS — F319 Bipolar disorder, unspecified: Secondary | ICD-10-CM | POA: Diagnosis not present

## 2014-11-23 DIAGNOSIS — F209 Schizophrenia, unspecified: Secondary | ICD-10-CM | POA: Diagnosis not present

## 2014-11-23 DIAGNOSIS — F419 Anxiety disorder, unspecified: Secondary | ICD-10-CM | POA: Diagnosis not present

## 2014-11-23 DIAGNOSIS — M6281 Muscle weakness (generalized): Secondary | ICD-10-CM | POA: Diagnosis not present

## 2014-11-23 DIAGNOSIS — M150 Primary generalized (osteo)arthritis: Secondary | ICD-10-CM | POA: Diagnosis not present

## 2014-11-30 DIAGNOSIS — R21 Rash and other nonspecific skin eruption: Secondary | ICD-10-CM | POA: Diagnosis not present

## 2014-12-01 DIAGNOSIS — S92002D Unspecified fracture of left calcaneus, subsequent encounter for fracture with routine healing: Secondary | ICD-10-CM | POA: Diagnosis not present

## 2014-12-01 DIAGNOSIS — S92341D Displaced fracture of fourth metatarsal bone, right foot, subsequent encounter for fracture with routine healing: Secondary | ICD-10-CM | POA: Diagnosis not present

## 2014-12-07 DIAGNOSIS — M6281 Muscle weakness (generalized): Secondary | ICD-10-CM | POA: Diagnosis not present

## 2014-12-07 DIAGNOSIS — F419 Anxiety disorder, unspecified: Secondary | ICD-10-CM | POA: Diagnosis not present

## 2014-12-07 DIAGNOSIS — S92002D Unspecified fracture of left calcaneus, subsequent encounter for fracture with routine healing: Secondary | ICD-10-CM | POA: Diagnosis not present

## 2014-12-07 DIAGNOSIS — F4310 Post-traumatic stress disorder, unspecified: Secondary | ICD-10-CM | POA: Diagnosis not present

## 2014-12-10 DIAGNOSIS — S92002D Unspecified fracture of left calcaneus, subsequent encounter for fracture with routine healing: Secondary | ICD-10-CM | POA: Diagnosis not present

## 2014-12-10 DIAGNOSIS — S22088D Other fracture of T11-T12 vertebra, subsequent encounter for fracture with routine healing: Secondary | ICD-10-CM | POA: Diagnosis not present

## 2014-12-10 DIAGNOSIS — S32018D Other fracture of first lumbar vertebra, subsequent encounter for fracture with routine healing: Secondary | ICD-10-CM | POA: Diagnosis not present

## 2014-12-10 DIAGNOSIS — S92341D Displaced fracture of fourth metatarsal bone, right foot, subsequent encounter for fracture with routine healing: Secondary | ICD-10-CM | POA: Diagnosis not present

## 2014-12-10 DIAGNOSIS — S32028D Other fracture of second lumbar vertebra, subsequent encounter for fracture with routine healing: Secondary | ICD-10-CM | POA: Diagnosis not present

## 2014-12-10 DIAGNOSIS — F319 Bipolar disorder, unspecified: Secondary | ICD-10-CM | POA: Diagnosis not present

## 2014-12-10 DIAGNOSIS — F209 Schizophrenia, unspecified: Secondary | ICD-10-CM | POA: Diagnosis not present

## 2014-12-13 DIAGNOSIS — S32028D Other fracture of second lumbar vertebra, subsequent encounter for fracture with routine healing: Secondary | ICD-10-CM | POA: Diagnosis not present

## 2014-12-13 DIAGNOSIS — S92002D Unspecified fracture of left calcaneus, subsequent encounter for fracture with routine healing: Secondary | ICD-10-CM | POA: Diagnosis not present

## 2014-12-13 DIAGNOSIS — F319 Bipolar disorder, unspecified: Secondary | ICD-10-CM | POA: Diagnosis not present

## 2014-12-13 DIAGNOSIS — S32018D Other fracture of first lumbar vertebra, subsequent encounter for fracture with routine healing: Secondary | ICD-10-CM | POA: Diagnosis not present

## 2014-12-13 DIAGNOSIS — S22088D Other fracture of T11-T12 vertebra, subsequent encounter for fracture with routine healing: Secondary | ICD-10-CM | POA: Diagnosis not present

## 2014-12-13 DIAGNOSIS — S92341D Displaced fracture of fourth metatarsal bone, right foot, subsequent encounter for fracture with routine healing: Secondary | ICD-10-CM | POA: Diagnosis not present

## 2014-12-14 ENCOUNTER — Emergency Department (HOSPITAL_COMMUNITY)
Admission: EM | Admit: 2014-12-14 | Discharge: 2014-12-15 | Disposition: A | Discharge status: Home / Self Care | Payer: Medicare Other | Attending: Emergency Medicine | Admitting: Emergency Medicine

## 2014-12-14 ENCOUNTER — Encounter (HOSPITAL_COMMUNITY): Payer: Self-pay | Admitting: Emergency Medicine

## 2014-12-14 DIAGNOSIS — Z8781 Personal history of (healed) traumatic fracture: Secondary | ICD-10-CM | POA: Insufficient documentation

## 2014-12-14 DIAGNOSIS — R197 Diarrhea, unspecified: Secondary | ICD-10-CM | POA: Diagnosis not present

## 2014-12-14 DIAGNOSIS — F259 Schizoaffective disorder, unspecified: Secondary | ICD-10-CM | POA: Insufficient documentation

## 2014-12-14 DIAGNOSIS — F319 Bipolar disorder, unspecified: Secondary | ICD-10-CM | POA: Diagnosis not present

## 2014-12-14 DIAGNOSIS — R110 Nausea: Secondary | ICD-10-CM | POA: Insufficient documentation

## 2014-12-14 DIAGNOSIS — Z79899 Other long term (current) drug therapy: Secondary | ICD-10-CM | POA: Insufficient documentation

## 2014-12-14 DIAGNOSIS — Z7982 Long term (current) use of aspirin: Secondary | ICD-10-CM | POA: Insufficient documentation

## 2014-12-14 DIAGNOSIS — Z791 Long term (current) use of non-steroidal anti-inflammatories (NSAID): Secondary | ICD-10-CM | POA: Diagnosis not present

## 2014-12-14 DIAGNOSIS — G8929 Other chronic pain: Secondary | ICD-10-CM | POA: Diagnosis not present

## 2014-12-14 DIAGNOSIS — Z3202 Encounter for pregnancy test, result negative: Secondary | ICD-10-CM | POA: Diagnosis not present

## 2014-12-14 DIAGNOSIS — R52 Pain, unspecified: Secondary | ICD-10-CM

## 2014-12-14 DIAGNOSIS — M549 Dorsalgia, unspecified: Secondary | ICD-10-CM

## 2014-12-14 DIAGNOSIS — M545 Low back pain: Secondary | ICD-10-CM | POA: Insufficient documentation

## 2014-12-14 DIAGNOSIS — Z7951 Long term (current) use of inhaled steroids: Secondary | ICD-10-CM | POA: Insufficient documentation

## 2014-12-14 DIAGNOSIS — F4310 Post-traumatic stress disorder, unspecified: Secondary | ICD-10-CM | POA: Diagnosis not present

## 2014-12-14 HISTORY — DX: Dorsalgia, unspecified: M54.9

## 2014-12-14 HISTORY — DX: Other chronic pain: G89.29

## 2014-12-14 MED ORDER — KETOROLAC TROMETHAMINE 30 MG/ML IJ SOLN
30.0000 mg | Freq: Once | INTRAMUSCULAR | Status: AC
  Administered 2014-12-14: 30 mg via INTRAVENOUS
  Filled 2014-12-14: qty 1

## 2014-12-14 MED ORDER — FENTANYL CITRATE 0.05 MG/ML IJ SOLN
INTRAMUSCULAR | Status: AC
  Filled 2014-12-14: qty 2

## 2014-12-14 MED ORDER — METHOCARBAMOL 1000 MG/10ML IJ SOLN
1000.0000 mg | Freq: Once | INTRAMUSCULAR | Status: DC
  Filled 2014-12-14: qty 10

## 2014-12-14 MED ORDER — ONDANSETRON 4 MG PO TBDP
8.0000 mg | ORAL_TABLET | Freq: Once | ORAL | Status: AC
  Administered 2014-12-14: 8 mg via ORAL
  Filled 2014-12-14: qty 2

## 2014-12-14 MED ORDER — FENTANYL CITRATE 0.05 MG/ML IJ SOLN
50.0000 ug | Freq: Once | INTRAMUSCULAR | Status: AC
  Administered 2014-12-14: 50 ug via NASAL

## 2014-12-14 NOTE — ED Provider Notes (Signed)
CSN: [id number]     Arrival date & time 12/14/14  2112 History  This chart was scribed for Lauren Schrom Alfonso Patten, MD by Evelene Croon, ED Scribe. This patient was seen in room A03C/A03C and the patient's care was started 11:12 PM.    Chief Complaint  Patient presents with  . Back Pain      Patient is a 48 y.o. female presenting with back pain. The history is provided by the patient. No language interpreter was used.  Back Pain Location:  Lumbar spine Quality:  Burning Radiates to:  Does not radiate Pain severity:  Severe Pain is:  Same all the time Onset quality:  Gradual Timing:  Constant Progression:  Worsening Chronicity:  Chronic Context: not recent illness   Relieved by:  Nothing Worsened by:  Nothing tried Ineffective treatments: percocet which she left at her mother's house in [place] and can't retrieve them. Associated symptoms: no abdominal pain, no bladder incontinence, no bowel incontinence, no numbness, no paresthesias and no weight loss   Risk factors: no hx of cancer      HPI Comments:  Lauren Fuentes is a 48 y.o. female with a h/o chronic back pain x several years who presents to the Emergency Department complaining of moderate back pain that has worsened over the last few days. She describes her pain as a burning sensation and notes it is the same pain felt with chronic back pain. Her last dose of narcotic pain meds was 1 day ago, states she left her meds in [place] at her mother's house. She reports acute nausea and diarrhea for the last day. No alleviating factors noted.  Pt notes she missed her appointment with Dr. Rolena Infante today.     Past Medical History  Diagnosis Date  . Bipolar 1 disorder   . Schizoaffective disorder   . Post traumatic stress disorder (PTSD)   . Calcaneal fracture, Left  10/19/2014  . Thoracic compression fracture, T12 endplate  62/02/5596  . Lumbar compression fracture, L1-2 endplate  41/63/8453  . Fracture of metatarsal bone of right  foot, 4th neck 10/22/2014  . Back pain, chronic    Past Surgical History  Procedure Laterality Date  . Tubal ligation    . Orif calcaneous fracture Left 10/21/2014    Procedure: OPEN REDUCTION INTERNAL FIXATION (ORIF) LEFT CALCANEOUS FRACTURE;  Surgeon: Rozanna Box, MD;  Location: [facility];  Service: Orthopedics;  Laterality: Left;   No family history on file. History  Substance Use Topics  . Smoking status: Never Smoker   . Smokeless tobacco: Not on file  . Alcohol Use: Yes     Comment: rare   OB History    No data available     Review of Systems  Constitutional: Negative for weight loss.  Gastrointestinal: Positive for nausea and diarrhea. Negative for abdominal pain and bowel incontinence.  Genitourinary: Negative for bladder incontinence.  Musculoskeletal: Positive for back pain.  Neurological: Negative for numbness and paresthesias.  All other systems reviewed and are negative.     Allergies  Review of patient's allergies indicates no known allergies.  Home Medications   Prior to Admission medications   Medication Sig Start Date End Date Taking? Authorizing Provider  ALPRAZolam Duanne Moron) 0.5 MG tablet Take 0.5 mg by mouth every 12 (twelve) hours as needed for anxiety (anxiety).   Yes Historical Provider, MD  aspirin EC 325 MG tablet Take 1 tablet (325 mg total) by mouth 2 (two) times daily. 10/22/14  Yes Jari Pigg,  PA-C  busPIRone (BUSPAR) 7.5 MG tablet Take 7.5 mg by mouth 3 (three) times daily.   Yes Historical Provider, MD  docusate sodium 100 MG CAPS Take 100 mg by mouth 2 (two) times daily. 10/22/14  Yes Jari Pigg, PA-C  FLUoxetine (PROZAC) 40 MG capsule Take 40 mg by mouth daily.   Yes Historical Provider, MD  fluticasone (FLONASE) 50 MCG/ACT nasal spray Place 2 sprays into the nose daily.   Yes Historical Provider, MD  lamoTRIgine (LAMICTAL) 100 MG tablet Take 100 mg by mouth 2 (two) times daily.   Yes Historical Provider, MD  loxapine (LOXITANE) 10 MG  capsule Take 10 mg by mouth at bedtime.   Yes Historical Provider, MD  loxapine (LOXITANE) 5 MG capsule Take 5 mg by mouth at bedtime.   Yes Historical Provider, MD  meloxicam (MOBIC) 7.5 MG tablet Take 7.5 mg by mouth daily.   Yes Historical Provider, MD  methocarbamol (ROBAXIN) 500 MG tablet Take 1-2 tablets (500-1,000 mg total) by mouth every 6 (six) hours as needed for muscle spasms. 10/25/14  Yes Jari Pigg, PA-C  oxyCODONE-acetaminophen (PERCOCET/ROXICET) 5-325 MG per tablet Take 1-2 tablets by mouth every 6 (six) hours as needed for moderate pain. 10/22/14  Yes Jari Pigg, PA-C  trazodone (DESYREL) 300 MG tablet Take 300 mg by mouth at bedtime.   Yes Historical Provider, MD  FLUoxetine (PROZAC) 20 MG capsule Take 40 mg by mouth daily.    Historical Provider, MD  hydrocortisone cream 1 % Apply 1 application topically 2 (two) times daily as needed for itching. Patient not taking: Reported on 12/14/2014 11/08/14   Patty Sermons Camprubi-Soms, PA-C  hydrOXYzine (ATARAX/VISTARIL) 25 MG tablet Take 1 tablet (25 mg total) by mouth every 6 (six) hours as needed for itching. Patient not taking: Reported on 12/14/2014 11/08/14   Patty Sermons Camprubi-Soms, PA-C  loxapine (LOXITANE) 5 MG capsule Take 5 mg by mouth at bedtime.    Historical Provider, MD  oxyCODONE (OXY IR/ROXICODONE) 5 MG immediate release tablet Take 1-2 tablets (5-10 mg total) by mouth every 6 (six) hours as needed for breakthrough pain (take between percocet for breakthrough pain). Patient not taking: Reported on 12/14/2014 10/22/14   Jari Pigg, PA-C  oxyCODONE-acetaminophen (PERCOCET) 10-325 MG per tablet Take 1 tablet by mouth every 6 (six) hours as needed for pain (pain).    Historical Provider, MD  senna (SENOKOT) 8.6 MG tablet Take 2 tablets by mouth every evening.    Historical Provider, MD  traZODone (DESYREL) 150 MG tablet Take 300 mg by mouth at bedtime.    Historical Provider, MD  traZODone (DESYREL) 150 MG tablet  Take 300 mg by mouth at bedtime.    Historical Provider, MD  valACYclovir (VALTREX) 1000 MG tablet Take 1,000 mg by mouth 2 (two) times daily.    Historical Provider, MD  valACYclovir (VALTREX) 500 MG tablet Take 1,000 mg by mouth 2 (two) times daily.    Historical Provider, MD   BP 141/72 mmHg  Pulse 108  Temp(Src) 97.8 F (36.6 C) (Oral)  Resp 16  SpO2 99% Physical Exam  Constitutional: She is oriented to person, place, and time. She appears well-developed and well-nourished. No distress.  HENT:  Head: Normocephalic and atraumatic.  Mouth/Throat: Oropharynx is clear and moist.  Eyes: Conjunctivae and EOM are normal. Pupils are equal, round, and reactive to light.  Neck: Normal range of motion. Neck supple.  Cardiovascular: Normal rate, regular rhythm, normal heart sounds and intact distal  pulses.   Pulmonary/Chest: Effort normal and breath sounds normal. She has no wheezes. She has no rales.  Abdominal: Soft. Bowel sounds are normal. She exhibits no distension. There is no tenderness. There is no rebound and no guarding.  Musculoskeletal: Normal range of motion. She exhibits no edema or tenderness.  FROM of all 4 extremities;NVI; Sensation intact to BLE  Neurological: She is alert and oriented to person, place, and time. She has normal reflexes.  Skin: Skin is warm and dry.  Psychiatric: She has a normal mood and affect.  Nursing note and vitals reviewed.   ED Course  Procedures   DIAGNOSTIC STUDIES:  Oxygen Saturation is 99% on RA, normal by my interpretation.    COORDINATION OF CARE:  11:19 PM Discussed treatment plan with pt at bedside and pt agreed to plan.  Labs Review Labs Reviewed - No data to display  Imaging Review No results found.   EKG Interpretation None      MDM   Final diagnoses:  None    Patient states she has pain medication but left it at her mother's house.  Will treat her chronic pain with muscle relaxants.  We will not be refilling  chronic medication.  Follow up with your family doctor for ongoing care.    I personally performed the services described in this documentation, which was scribed in my presence. The recorded information has been reviewed and is accurate.     Carlisle Beers, MD 12/15/14 [phone]

## 2014-12-14 NOTE — ED Notes (Signed)
Pt. reports chronic pain at entire back for several years worse these past several days with nausea and vomitting , denies recent fall or injury.

## 2014-12-15 ENCOUNTER — Encounter (HOSPITAL_COMMUNITY): Payer: Self-pay | Admitting: Emergency Medicine

## 2014-12-15 ENCOUNTER — Emergency Department (HOSPITAL_COMMUNITY): Payer: Medicare Other

## 2014-12-15 DIAGNOSIS — S92341D Displaced fracture of fourth metatarsal bone, right foot, subsequent encounter for fracture with routine healing: Secondary | ICD-10-CM | POA: Diagnosis not present

## 2014-12-15 DIAGNOSIS — M545 Low back pain: Secondary | ICD-10-CM | POA: Diagnosis not present

## 2014-12-15 DIAGNOSIS — F319 Bipolar disorder, unspecified: Secondary | ICD-10-CM | POA: Diagnosis not present

## 2014-12-15 DIAGNOSIS — S92002D Unspecified fracture of left calcaneus, subsequent encounter for fracture with routine healing: Secondary | ICD-10-CM | POA: Diagnosis not present

## 2014-12-15 DIAGNOSIS — S32028D Other fracture of second lumbar vertebra, subsequent encounter for fracture with routine healing: Secondary | ICD-10-CM | POA: Diagnosis not present

## 2014-12-15 DIAGNOSIS — S32018D Other fracture of first lumbar vertebra, subsequent encounter for fracture with routine healing: Secondary | ICD-10-CM | POA: Diagnosis not present

## 2014-12-15 DIAGNOSIS — S22088D Other fracture of T11-T12 vertebra, subsequent encounter for fracture with routine healing: Secondary | ICD-10-CM | POA: Diagnosis not present

## 2014-12-15 LAB — RAPID URINE DRUG SCREEN, HOSP PERFORMED
Amphetamines: NOT DETECTED
BENZODIAZEPINES: POSITIVE — AB
Barbiturates: NOT DETECTED
COCAINE: NOT DETECTED
OPIATES: NOT DETECTED
Tetrahydrocannabinol: NOT DETECTED

## 2014-12-15 LAB — URINALYSIS, ROUTINE W REFLEX MICROSCOPIC
Bilirubin Urine: NEGATIVE
GLUCOSE, UA: NEGATIVE mg/dL
Hgb urine dipstick: NEGATIVE
Ketones, ur: NEGATIVE mg/dL
LEUKOCYTES UA: NEGATIVE
Nitrite: NEGATIVE
PH: 6.5 (ref 5.0–8.0)
Protein, ur: NEGATIVE mg/dL
SPECIFIC GRAVITY, URINE: 1.012 (ref 1.005–1.030)
Urobilinogen, UA: 1 mg/dL (ref 0.0–1.0)

## 2014-12-15 LAB — POC URINE PREG, ED: Preg Test, Ur: NEGATIVE

## 2014-12-15 MED ORDER — METHOCARBAMOL 1000 MG/10ML IJ SOLN
1000.0000 mg | Freq: Once | INTRAMUSCULAR | Status: DC
  Filled 2014-12-15: qty 10

## 2014-12-15 MED ORDER — METHOCARBAMOL 1000 MG/10ML IJ SOLN
1000.0000 mg | Freq: Once | INTRAVENOUS | Status: AC
  Administered 2014-12-15: 1000 mg via INTRAVENOUS
  Filled 2014-12-15: qty 10

## 2014-12-15 MED ORDER — MELOXICAM 7.5 MG PO TABS: 7.5000 mg | ORAL_TABLET | Freq: Every day | ORAL | Status: DC

## 2014-12-15 MED ORDER — DICYCLOMINE HCL 10 MG PO CAPS
10.0000 mg | ORAL_CAPSULE | Freq: Once | ORAL | Status: AC
  Administered 2014-12-15: 10 mg via ORAL
  Filled 2014-12-15: qty 1

## 2014-12-15 MED ORDER — METHOCARBAMOL 500 MG PO TABS: 500.0000 mg | ORAL_TABLET | Freq: Two times a day (BID) | ORAL | Status: DC

## 2014-12-15 MED ORDER — OXYCODONE-ACETAMINOPHEN 5-325 MG PO TABS
2.0000 | ORAL_TABLET | Freq: Once | ORAL | Status: AC
  Administered 2014-12-15: 2 via ORAL
  Filled 2014-12-15: qty 2

## 2014-12-15 NOTE — Discharge Instructions (Signed)

## 2014-12-16 DIAGNOSIS — S92002D Unspecified fracture of left calcaneus, subsequent encounter for fracture with routine healing: Secondary | ICD-10-CM | POA: Diagnosis not present

## 2014-12-16 DIAGNOSIS — F319 Bipolar disorder, unspecified: Secondary | ICD-10-CM | POA: Diagnosis not present

## 2014-12-16 DIAGNOSIS — S92341D Displaced fracture of fourth metatarsal bone, right foot, subsequent encounter for fracture with routine healing: Secondary | ICD-10-CM | POA: Diagnosis not present

## 2014-12-16 DIAGNOSIS — S32018D Other fracture of first lumbar vertebra, subsequent encounter for fracture with routine healing: Secondary | ICD-10-CM | POA: Diagnosis not present

## 2014-12-16 DIAGNOSIS — S22088D Other fracture of T11-T12 vertebra, subsequent encounter for fracture with routine healing: Secondary | ICD-10-CM | POA: Diagnosis not present

## 2014-12-16 DIAGNOSIS — S32028D Other fracture of second lumbar vertebra, subsequent encounter for fracture with routine healing: Secondary | ICD-10-CM | POA: Diagnosis not present

## 2014-12-19 DIAGNOSIS — S32018D Other fracture of first lumbar vertebra, subsequent encounter for fracture with routine healing: Secondary | ICD-10-CM | POA: Diagnosis not present

## 2014-12-19 DIAGNOSIS — F319 Bipolar disorder, unspecified: Secondary | ICD-10-CM | POA: Diagnosis not present

## 2014-12-19 DIAGNOSIS — S92341D Displaced fracture of fourth metatarsal bone, right foot, subsequent encounter for fracture with routine healing: Secondary | ICD-10-CM | POA: Diagnosis not present

## 2014-12-19 DIAGNOSIS — S32028D Other fracture of second lumbar vertebra, subsequent encounter for fracture with routine healing: Secondary | ICD-10-CM | POA: Diagnosis not present

## 2014-12-19 DIAGNOSIS — S22088D Other fracture of T11-T12 vertebra, subsequent encounter for fracture with routine healing: Secondary | ICD-10-CM | POA: Diagnosis not present

## 2014-12-19 DIAGNOSIS — S92002D Unspecified fracture of left calcaneus, subsequent encounter for fracture with routine healing: Secondary | ICD-10-CM | POA: Diagnosis not present

## 2014-12-21 DIAGNOSIS — S92002D Unspecified fracture of left calcaneus, subsequent encounter for fracture with routine healing: Secondary | ICD-10-CM | POA: Diagnosis not present

## 2014-12-21 DIAGNOSIS — S32018D Other fracture of first lumbar vertebra, subsequent encounter for fracture with routine healing: Secondary | ICD-10-CM | POA: Diagnosis not present

## 2014-12-21 DIAGNOSIS — S92341D Displaced fracture of fourth metatarsal bone, right foot, subsequent encounter for fracture with routine healing: Secondary | ICD-10-CM | POA: Diagnosis not present

## 2014-12-21 DIAGNOSIS — F319 Bipolar disorder, unspecified: Secondary | ICD-10-CM | POA: Diagnosis not present

## 2014-12-21 DIAGNOSIS — S22088D Other fracture of T11-T12 vertebra, subsequent encounter for fracture with routine healing: Secondary | ICD-10-CM | POA: Diagnosis not present

## 2014-12-21 DIAGNOSIS — S32028D Other fracture of second lumbar vertebra, subsequent encounter for fracture with routine healing: Secondary | ICD-10-CM | POA: Diagnosis not present

## 2014-12-22 DIAGNOSIS — S92341D Displaced fracture of fourth metatarsal bone, right foot, subsequent encounter for fracture with routine healing: Secondary | ICD-10-CM | POA: Diagnosis not present

## 2014-12-22 DIAGNOSIS — F319 Bipolar disorder, unspecified: Secondary | ICD-10-CM | POA: Diagnosis not present

## 2014-12-22 DIAGNOSIS — S22088D Other fracture of T11-T12 vertebra, subsequent encounter for fracture with routine healing: Secondary | ICD-10-CM | POA: Diagnosis not present

## 2014-12-22 DIAGNOSIS — S32028D Other fracture of second lumbar vertebra, subsequent encounter for fracture with routine healing: Secondary | ICD-10-CM | POA: Diagnosis not present

## 2014-12-22 DIAGNOSIS — S92002D Unspecified fracture of left calcaneus, subsequent encounter for fracture with routine healing: Secondary | ICD-10-CM | POA: Diagnosis not present

## 2014-12-22 DIAGNOSIS — S32018D Other fracture of first lumbar vertebra, subsequent encounter for fracture with routine healing: Secondary | ICD-10-CM | POA: Diagnosis not present

## 2014-12-25 DIAGNOSIS — S92341D Displaced fracture of fourth metatarsal bone, right foot, subsequent encounter for fracture with routine healing: Secondary | ICD-10-CM | POA: Diagnosis not present

## 2014-12-25 DIAGNOSIS — S22088D Other fracture of T11-T12 vertebra, subsequent encounter for fracture with routine healing: Secondary | ICD-10-CM | POA: Diagnosis not present

## 2014-12-25 DIAGNOSIS — F319 Bipolar disorder, unspecified: Secondary | ICD-10-CM | POA: Diagnosis not present

## 2014-12-25 DIAGNOSIS — S32028D Other fracture of second lumbar vertebra, subsequent encounter for fracture with routine healing: Secondary | ICD-10-CM | POA: Diagnosis not present

## 2014-12-25 DIAGNOSIS — S32018D Other fracture of first lumbar vertebra, subsequent encounter for fracture with routine healing: Secondary | ICD-10-CM | POA: Diagnosis not present

## 2014-12-25 DIAGNOSIS — S92002D Unspecified fracture of left calcaneus, subsequent encounter for fracture with routine healing: Secondary | ICD-10-CM | POA: Diagnosis not present

## 2014-12-28 DIAGNOSIS — S32028D Other fracture of second lumbar vertebra, subsequent encounter for fracture with routine healing: Secondary | ICD-10-CM | POA: Diagnosis not present

## 2014-12-28 DIAGNOSIS — S22088D Other fracture of T11-T12 vertebra, subsequent encounter for fracture with routine healing: Secondary | ICD-10-CM | POA: Diagnosis not present

## 2014-12-28 DIAGNOSIS — S92002D Unspecified fracture of left calcaneus, subsequent encounter for fracture with routine healing: Secondary | ICD-10-CM | POA: Diagnosis not present

## 2014-12-28 DIAGNOSIS — F319 Bipolar disorder, unspecified: Secondary | ICD-10-CM | POA: Diagnosis not present

## 2014-12-28 DIAGNOSIS — S32018D Other fracture of first lumbar vertebra, subsequent encounter for fracture with routine healing: Secondary | ICD-10-CM | POA: Diagnosis not present

## 2014-12-28 DIAGNOSIS — S92341D Displaced fracture of fourth metatarsal bone, right foot, subsequent encounter for fracture with routine healing: Secondary | ICD-10-CM | POA: Diagnosis not present

## 2014-12-29 DIAGNOSIS — S92002D Unspecified fracture of left calcaneus, subsequent encounter for fracture with routine healing: Secondary | ICD-10-CM | POA: Diagnosis not present

## 2014-12-29 DIAGNOSIS — S92341D Displaced fracture of fourth metatarsal bone, right foot, subsequent encounter for fracture with routine healing: Secondary | ICD-10-CM | POA: Diagnosis not present

## 2014-12-30 DIAGNOSIS — S22088D Other fracture of T11-T12 vertebra, subsequent encounter for fracture with routine healing: Secondary | ICD-10-CM | POA: Diagnosis not present

## 2014-12-30 DIAGNOSIS — F319 Bipolar disorder, unspecified: Secondary | ICD-10-CM | POA: Diagnosis not present

## 2014-12-30 DIAGNOSIS — S92341D Displaced fracture of fourth metatarsal bone, right foot, subsequent encounter for fracture with routine healing: Secondary | ICD-10-CM | POA: Diagnosis not present

## 2014-12-30 DIAGNOSIS — S32028D Other fracture of second lumbar vertebra, subsequent encounter for fracture with routine healing: Secondary | ICD-10-CM | POA: Diagnosis not present

## 2014-12-30 DIAGNOSIS — S92002D Unspecified fracture of left calcaneus, subsequent encounter for fracture with routine healing: Secondary | ICD-10-CM | POA: Diagnosis not present

## 2014-12-30 DIAGNOSIS — S32018D Other fracture of first lumbar vertebra, subsequent encounter for fracture with routine healing: Secondary | ICD-10-CM | POA: Diagnosis not present

## 2015-01-03 DIAGNOSIS — S32020D Wedge compression fracture of second lumbar vertebra, subsequent encounter for fracture with routine healing: Secondary | ICD-10-CM | POA: Diagnosis not present

## 2015-01-04 DIAGNOSIS — S32028D Other fracture of second lumbar vertebra, subsequent encounter for fracture with routine healing: Secondary | ICD-10-CM | POA: Diagnosis not present

## 2015-01-04 DIAGNOSIS — S32018D Other fracture of first lumbar vertebra, subsequent encounter for fracture with routine healing: Secondary | ICD-10-CM | POA: Diagnosis not present

## 2015-01-04 DIAGNOSIS — S92341D Displaced fracture of fourth metatarsal bone, right foot, subsequent encounter for fracture with routine healing: Secondary | ICD-10-CM | POA: Diagnosis not present

## 2015-01-04 DIAGNOSIS — S22088D Other fracture of T11-T12 vertebra, subsequent encounter for fracture with routine healing: Secondary | ICD-10-CM | POA: Diagnosis not present

## 2015-01-04 DIAGNOSIS — S92002D Unspecified fracture of left calcaneus, subsequent encounter for fracture with routine healing: Secondary | ICD-10-CM | POA: Diagnosis not present

## 2015-01-04 DIAGNOSIS — F319 Bipolar disorder, unspecified: Secondary | ICD-10-CM | POA: Diagnosis not present

## 2015-01-06 DIAGNOSIS — S92002D Unspecified fracture of left calcaneus, subsequent encounter for fracture with routine healing: Secondary | ICD-10-CM | POA: Diagnosis not present

## 2015-01-06 DIAGNOSIS — S32018D Other fracture of first lumbar vertebra, subsequent encounter for fracture with routine healing: Secondary | ICD-10-CM | POA: Diagnosis not present

## 2015-01-06 DIAGNOSIS — S92341D Displaced fracture of fourth metatarsal bone, right foot, subsequent encounter for fracture with routine healing: Secondary | ICD-10-CM | POA: Diagnosis not present

## 2015-01-06 DIAGNOSIS — S22088D Other fracture of T11-T12 vertebra, subsequent encounter for fracture with routine healing: Secondary | ICD-10-CM | POA: Diagnosis not present

## 2015-01-06 DIAGNOSIS — F319 Bipolar disorder, unspecified: Secondary | ICD-10-CM | POA: Diagnosis not present

## 2015-01-06 DIAGNOSIS — S32028D Other fracture of second lumbar vertebra, subsequent encounter for fracture with routine healing: Secondary | ICD-10-CM | POA: Diagnosis not present

## 2015-01-10 ENCOUNTER — Ambulatory Visit: Payer: Medicare Other | Attending: Orthopedic Surgery

## 2015-01-10 DIAGNOSIS — S92002D Unspecified fracture of left calcaneus, subsequent encounter for fracture with routine healing: Secondary | ICD-10-CM | POA: Diagnosis not present

## 2015-01-10 DIAGNOSIS — S22088D Other fracture of T11-T12 vertebra, subsequent encounter for fracture with routine healing: Secondary | ICD-10-CM | POA: Diagnosis not present

## 2015-01-10 DIAGNOSIS — F319 Bipolar disorder, unspecified: Secondary | ICD-10-CM | POA: Diagnosis not present

## 2015-01-10 DIAGNOSIS — S92341D Displaced fracture of fourth metatarsal bone, right foot, subsequent encounter for fracture with routine healing: Secondary | ICD-10-CM | POA: Diagnosis not present

## 2015-01-10 DIAGNOSIS — S32028D Other fracture of second lumbar vertebra, subsequent encounter for fracture with routine healing: Secondary | ICD-10-CM | POA: Diagnosis not present

## 2015-01-10 DIAGNOSIS — S32018D Other fracture of first lumbar vertebra, subsequent encounter for fracture with routine healing: Secondary | ICD-10-CM | POA: Diagnosis not present

## 2015-02-09 DIAGNOSIS — F319 Bipolar disorder, unspecified: Secondary | ICD-10-CM | POA: Diagnosis not present

## 2015-02-15 ENCOUNTER — Other Ambulatory Visit (HOSPITAL_COMMUNITY): Payer: Medicare Other

## 2015-02-16 ENCOUNTER — Encounter (HOSPITAL_COMMUNITY): Payer: Self-pay

## 2015-02-16 ENCOUNTER — Encounter (HOSPITAL_COMMUNITY)
Admission: RE | Admit: 2015-02-16 | Discharge: 2015-02-16 | Disposition: A | Discharge status: Home / Self Care | Payer: Medicare Other | Source: Ambulatory Visit | Attending: Orthopedic Surgery | Admitting: Orthopedic Surgery

## 2015-02-16 DIAGNOSIS — Z23 Encounter for immunization: Secondary | ICD-10-CM | POA: Diagnosis not present

## 2015-02-16 DIAGNOSIS — Z79899 Other long term (current) drug therapy: Secondary | ICD-10-CM | POA: Diagnosis not present

## 2015-02-16 DIAGNOSIS — F259 Schizoaffective disorder, unspecified: Secondary | ICD-10-CM | POA: Diagnosis not present

## 2015-02-16 DIAGNOSIS — M8468XA Pathological fracture in other disease, other site, initial encounter for fracture: Secondary | ICD-10-CM | POA: Diagnosis not present

## 2015-02-16 DIAGNOSIS — F319 Bipolar disorder, unspecified: Secondary | ICD-10-CM | POA: Diagnosis not present

## 2015-02-16 DIAGNOSIS — S92341D Displaced fracture of fourth metatarsal bone, right foot, subsequent encounter for fracture with routine healing: Secondary | ICD-10-CM | POA: Diagnosis not present

## 2015-02-16 DIAGNOSIS — S92002D Unspecified fracture of left calcaneus, subsequent encounter for fracture with routine healing: Secondary | ICD-10-CM | POA: Diagnosis not present

## 2015-02-16 DIAGNOSIS — M8588 Other specified disorders of bone density and structure, other site: Secondary | ICD-10-CM | POA: Diagnosis not present

## 2015-02-16 DIAGNOSIS — K219 Gastro-esophageal reflux disease without esophagitis: Secondary | ICD-10-CM | POA: Diagnosis not present

## 2015-02-16 DIAGNOSIS — F17200 Nicotine dependence, unspecified, uncomplicated: Secondary | ICD-10-CM | POA: Diagnosis not present

## 2015-02-16 DIAGNOSIS — M8448XA Pathological fracture, other site, initial encounter for fracture: Secondary | ICD-10-CM | POA: Diagnosis present

## 2015-02-16 HISTORY — DX: Headache: R51

## 2015-02-16 HISTORY — DX: Unspecified osteoarthritis, unspecified site: M19.90

## 2015-02-16 HISTORY — DX: Headache, unspecified: R51.9

## 2015-02-16 HISTORY — DX: Gastro-esophageal reflux disease without esophagitis: K21.9

## 2015-02-16 HISTORY — DX: Inflammatory liver disease, unspecified: K75.9

## 2015-02-16 LAB — COMPREHENSIVE METABOLIC PANEL WITH GFR
ALT: 124 U/L — ABNORMAL HIGH (ref 0–35)
AST: 100 U/L — ABNORMAL HIGH (ref 0–37)
Albumin: 3.5 g/dL (ref 3.5–5.2)
Alkaline Phosphatase: 84 U/L (ref 39–117)
Anion gap: 5 (ref 5–15)
BUN: 8 mg/dL (ref 6–23)
CO2: 32 mmol/L (ref 19–32)
Calcium: 9.5 mg/dL (ref 8.4–10.5)
Chloride: 99 mmol/L (ref 96–112)
Creatinine, Ser: 1.02 mg/dL (ref 0.50–1.10)
GFR calc Af Amer: 73 mL/min — ABNORMAL LOW (ref >90)
GFR calc non Af Amer: 63 mL/min — ABNORMAL LOW (ref >90)
Glucose, Bld: 91 mg/dL (ref 70–99)
Potassium: 4.7 mmol/L (ref 3.5–5.1)
Sodium: 136 mmol/L (ref 135–145)
Total Bilirubin: 0.7 mg/dL (ref 0.3–1.2)
Total Protein: 6.9 g/dL (ref 6.0–8.3)

## 2015-02-16 LAB — CBC
HCT: 42.7 % (ref 36.0–46.0)
Hemoglobin: 14 g/dL (ref 12.0–15.0)
MCH: 30.6 pg (ref 26.0–34.0)
MCHC: 32.8 g/dL (ref 30.0–36.0)
MCV: 93.2 fL (ref 78.0–100.0)
Platelets: 312 K/uL (ref 150–400)
RBC: 4.58 MIL/uL (ref 3.87–5.11)
RDW: 13.6 % (ref 11.5–15.5)
WBC: 6.7 K/uL (ref 4.0–10.5)

## 2015-02-16 LAB — SURGICAL PCR SCREEN
MRSA, PCR: NEGATIVE
STAPHYLOCOCCUS AUREUS: NEGATIVE

## 2015-02-16 LAB — HCG, SERUM, QUALITATIVE: Preg, Serum: NEGATIVE

## 2015-02-16 MED ORDER — CEFAZOLIN SODIUM-DEXTROSE 2-3 GM-% IV SOLR
2.0000 g | INTRAVENOUS | Status: AC
  Administered 2015-02-17: 2 g via INTRAVENOUS
  Filled 2015-02-16: qty 50

## 2015-02-16 NOTE — Pre-Procedure Instructions (Signed)
Lauren Fuentes  02/16/2015   Your procedure is scheduled on:  Thursday, Feb. 25  Report to [facility] Main Entrance "A" at 5:30 AM.  Call this number if you have problems the morning of surgery: [phone]               Questions prior to surgery call pre-admission [phone] prior to 4:00pm today.   Remember:  Bring Brace to the hospital.   Do not eat food or drink liquids after midnight.   Take these medicines the morning of surgery with A SIP OF WATER: Xanax, Buspar, Prozac, Meloxicam and pain medication if needed. Stop Aspirin, Nsaids(Motrin, Advil, Aleve, Naproxen), and herbal medications now.   Do not wear jewelry, make-up or nail polish.  Do not wear lotions, powders, or perfumes. You may wear deodorant.  Do not shave 48 hours prior to surgery. Men may shave face and neck.  Do not bring valuables to the hospital.  [facility] is not responsible   for any belongings or valuables.               Contacts, dentures or bridgework may not be worn into surgery.  Leave suitcase in the car. After surgery it may be brought to your room.  For patients admitted to the hospital, discharge time is determined by your                treatment team.               Patients discharged the day of surgery will not be allowed to drive  home.    Special Instructions: [place] - Preparing for Surgery  Before surgery, you can play an important role.  Because skin is not sterile, your skin needs to be as free of germs as possible.  You can reduce the number of germs on you skin by washing with CHG (chlorahexidine gluconate) soap before surgery.  CHG is an antiseptic cleaner which kills germs and bonds with the skin to continue killing germs even after washing.  Please DO NOT use if you have an allergy to CHG or antibacterial soaps.  If your skin becomes reddened/irritated stop using the CHG and inform your nurse when you arrive at Short Stay.  Do not shave (including legs and underarms)  for at least 48 hours prior to the first CHG shower.  You may shave your face.  Please follow these instructions carefully:   1.  Shower with CHG Soap the night before surgery and the                                morning of Surgery.  2.  If you choose to wash your hair, wash your hair first as usual with your       normal shampoo.  3.  After you shampoo, rinse your hair and body thoroughly to remove the                      Shampoo.  4.  Use CHG as you would any other liquid soap.  You can apply chg directly       to the skin and wash gently with scrungie or a clean washcloth.  5.  Apply the CHG Soap to your body ONLY FROM THE NECK DOWN.        Do not use on open wounds or open sores.  Avoid contact with  your eyes,       ears, mouth and genitals (private parts).  Wash genitals (private parts)       with your normal soap.  6.  Wash thoroughly, paying special attention to the area where your surgery        will be performed.  7.  Thoroughly rinse your body with warm water from the neck down.  8.  DO NOT shower/wash with your normal soap after using and rinsing off       the CHG Soap.  9.  Pat yourself dry with a clean towel.            10.  Wear clean pajamas.            11.  Place clean sheets on your bed the night of your first shower and do not        sleep with pets.  Day of Surgery  Do not apply any lotions/deoderants the morning of surgery.  Please wear clean clothes to the hospital/surgery center.      Please read over the following fact sheets that you were given: Pain Booklet, Coughing and Deep Breathing, MRSA Information and Surgical Site Infection Prevention

## 2015-02-17 ENCOUNTER — Observation Stay (HOSPITAL_COMMUNITY)
Admission: RE | Admit: 2015-02-17 | Discharge: 2015-02-18 | Disposition: A | Discharge status: Home / Self Care | Payer: Medicare Other | Source: Ambulatory Visit | Attending: Orthopedic Surgery | Admitting: Orthopedic Surgery

## 2015-02-17 ENCOUNTER — Inpatient Hospital Stay (HOSPITAL_COMMUNITY): Payer: Medicare Other | Admitting: Certified Registered"

## 2015-02-17 ENCOUNTER — Inpatient Hospital Stay (HOSPITAL_COMMUNITY): Payer: Medicare Other

## 2015-02-17 ENCOUNTER — Encounter (HOSPITAL_COMMUNITY): Payer: Self-pay | Admitting: *Deleted

## 2015-02-17 ENCOUNTER — Encounter (HOSPITAL_COMMUNITY)
Admission: RE | Disposition: A | Discharge status: Home / Self Care | Payer: Self-pay | Source: Ambulatory Visit | Attending: Orthopedic Surgery

## 2015-02-17 DIAGNOSIS — Z419 Encounter for procedure for purposes other than remedying health state, unspecified: Secondary | ICD-10-CM

## 2015-02-17 DIAGNOSIS — M8588 Other specified disorders of bone density and structure, other site: Secondary | ICD-10-CM | POA: Diagnosis not present

## 2015-02-17 DIAGNOSIS — Z23 Encounter for immunization: Secondary | ICD-10-CM | POA: Insufficient documentation

## 2015-02-17 DIAGNOSIS — S22089A Unspecified fracture of T11-T12 vertebra, initial encounter for closed fracture: Secondary | ICD-10-CM | POA: Diagnosis not present

## 2015-02-17 DIAGNOSIS — F319 Bipolar disorder, unspecified: Secondary | ICD-10-CM | POA: Diagnosis not present

## 2015-02-17 DIAGNOSIS — M4854XA Collapsed vertebra, not elsewhere classified, thoracic region, initial encounter for fracture: Secondary | ICD-10-CM | POA: Diagnosis not present

## 2015-02-17 DIAGNOSIS — K219 Gastro-esophageal reflux disease without esophagitis: Secondary | ICD-10-CM | POA: Insufficient documentation

## 2015-02-17 DIAGNOSIS — M8468XA Pathological fracture in other disease, other site, initial encounter for fracture: Principal | ICD-10-CM | POA: Insufficient documentation

## 2015-02-17 DIAGNOSIS — S22080A Wedge compression fracture of T11-T12 vertebra, initial encounter for closed fracture: Secondary | ICD-10-CM | POA: Diagnosis not present

## 2015-02-17 DIAGNOSIS — M549 Dorsalgia, unspecified: Secondary | ICD-10-CM | POA: Diagnosis not present

## 2015-02-17 DIAGNOSIS — S22000A Wedge compression fracture of unspecified thoracic vertebra, initial encounter for closed fracture: Secondary | ICD-10-CM | POA: Diagnosis present

## 2015-02-17 DIAGNOSIS — F259 Schizoaffective disorder, unspecified: Secondary | ICD-10-CM | POA: Insufficient documentation

## 2015-02-17 DIAGNOSIS — W19XXXA Unspecified fall, initial encounter: Secondary | ICD-10-CM | POA: Diagnosis not present

## 2015-02-17 DIAGNOSIS — Z79899 Other long term (current) drug therapy: Secondary | ICD-10-CM | POA: Insufficient documentation

## 2015-02-17 DIAGNOSIS — F17200 Nicotine dependence, unspecified, uncomplicated: Secondary | ICD-10-CM | POA: Insufficient documentation

## 2015-02-17 HISTORY — PX: KYPHOPLASTY: SHX5884

## 2015-02-17 SURGERY — KYPHOPLASTY
Anesthesia: General | Site: Spine Thoracic

## 2015-02-17 MED ORDER — FENTANYL CITRATE 0.05 MG/ML IJ SOLN
INTRAMUSCULAR | Status: AC
  Filled 2015-02-17: qty 2

## 2015-02-17 MED ORDER — FLUTICASONE PROPIONATE 50 MCG/ACT NA SUSP
2.0000 | Freq: Every day | NASAL | Status: DC
  Administered 2015-02-17 – 2015-02-18 (×2): 2 via NASAL
  Filled 2015-02-17: qty 16

## 2015-02-17 MED ORDER — PROPOFOL 10 MG/ML IV BOLUS
INTRAVENOUS | Status: AC
Start: 2015-02-17 — End: 2015-02-17
  Filled 2015-02-17: qty 20

## 2015-02-17 MED ORDER — LIDOCAINE HCL (CARDIAC) 20 MG/ML IV SOLN
INTRAVENOUS | Status: DC | PRN
  Administered 2015-02-17: 60 mg via INTRATRACHEAL
  Administered 2015-02-17: 100 mg via INTRAVENOUS

## 2015-02-17 MED ORDER — ROCURONIUM BROMIDE 50 MG/5ML IV SOLN
INTRAVENOUS | Status: AC
  Filled 2015-02-17: qty 1

## 2015-02-17 MED ORDER — STERILE WATER FOR INJECTION IJ SOLN
INTRAMUSCULAR | Status: AC
Start: 2015-02-17 — End: 2015-02-17
  Filled 2015-02-17: qty 10

## 2015-02-17 MED ORDER — MORPHINE SULFATE 2 MG/ML IJ SOLN
1.0000 mg | INTRAMUSCULAR | Status: DC | PRN
  Administered 2015-02-17 (×2): 2 mg via INTRAVENOUS
  Filled 2015-02-17 (×2): qty 1

## 2015-02-17 MED ORDER — VALACYCLOVIR HCL 500 MG PO TABS: 1000.0000 mg | ORAL_TABLET | Freq: Two times a day (BID) | ORAL | Status: DC

## 2015-02-17 MED ORDER — SUCCINYLCHOLINE CHLORIDE 20 MG/ML IJ SOLN
INTRAMUSCULAR | Status: AC
  Filled 2015-02-17: qty 1

## 2015-02-17 MED ORDER — LACTATED RINGERS IV SOLN: INTRAVENOUS | Status: DC

## 2015-02-17 MED ORDER — OXYCODONE HCL 5 MG/5ML PO SOLN: 5.0000 mg | Freq: Once | ORAL | Status: AC | PRN

## 2015-02-17 MED ORDER — NEOSTIGMINE METHYLSULFATE 10 MG/10ML IV SOLN
INTRAVENOUS | Status: DC | PRN
  Administered 2015-02-17: 4 mg via INTRAVENOUS

## 2015-02-17 MED ORDER — ONDANSETRON HCL 4 MG/2ML IJ SOLN
INTRAMUSCULAR | Status: AC
Start: 2015-02-17 — End: 2015-02-17
  Filled 2015-02-17: qty 2

## 2015-02-17 MED ORDER — ARTIFICIAL TEARS OP OINT
TOPICAL_OINTMENT | OPHTHALMIC | Status: AC
Start: 2015-02-17 — End: 2015-02-17
  Filled 2015-02-17: qty 3.5

## 2015-02-17 MED ORDER — ACETAMINOPHEN 160 MG/5ML PO SOLN
325.0000 mg | ORAL | Status: DC | PRN
  Filled 2015-02-17: qty 20.3

## 2015-02-17 MED ORDER — IOHEXOL 300 MG/ML  SOLN
INTRAMUSCULAR | Status: DC | PRN
  Administered 2015-02-17: 50 mL

## 2015-02-17 MED ORDER — GLYCOPYRROLATE 0.2 MG/ML IJ SOLN
INTRAMUSCULAR | Status: AC
  Filled 2015-02-17: qty 3

## 2015-02-17 MED ORDER — METHOCARBAMOL 500 MG PO TABS
500.0000 mg | ORAL_TABLET | Freq: Four times a day (QID) | ORAL | Status: DC | PRN
  Administered 2015-02-17 – 2015-02-18 (×3): 500 mg via ORAL
  Filled 2015-02-17 (×3): qty 1

## 2015-02-17 MED ORDER — OXYCODONE HCL 5 MG PO TABS
5.0000 mg | ORAL_TABLET | Freq: Once | ORAL | Status: AC | PRN
  Administered 2015-02-17: 5 mg via ORAL

## 2015-02-17 MED ORDER — TRAZODONE HCL 150 MG PO TABS
300.0000 mg | ORAL_TABLET | Freq: Every day | ORAL | Status: DC
  Administered 2015-02-17: 300 mg via ORAL
  Filled 2015-02-17 (×2): qty 2

## 2015-02-17 MED ORDER — BUPIVACAINE-EPINEPHRINE (PF) 0.25% -1:200000 IJ SOLN
INTRAMUSCULAR | Status: AC
  Filled 2015-02-17: qty 30

## 2015-02-17 MED ORDER — EPHEDRINE SULFATE 50 MG/ML IJ SOLN
INTRAMUSCULAR | Status: DC | PRN
  Administered 2015-02-17 (×2): 5 mg via INTRAVENOUS
  Administered 2015-02-17: 10 mg via INTRAVENOUS
  Administered 2015-02-17: 5 mg via INTRAVENOUS

## 2015-02-17 MED ORDER — PROPOFOL 10 MG/ML IV BOLUS
INTRAVENOUS | Status: DC | PRN
  Administered 2015-02-17: 150 mg via INTRAVENOUS

## 2015-02-17 MED ORDER — ONDANSETRON HCL 4 MG/2ML IJ SOLN
INTRAMUSCULAR | Status: DC | PRN
  Administered 2015-02-17: 4 mg via INTRAVENOUS

## 2015-02-17 MED ORDER — INFLUENZA VAC SPLIT QUAD 0.5 ML IM SUSY
0.5000 mL | PREFILLED_SYRINGE | INTRAMUSCULAR | Status: AC
  Administered 2015-02-18: 0.5 mL via INTRAMUSCULAR
  Filled 2015-02-17: qty 0.5

## 2015-02-17 MED ORDER — FLUOXETINE HCL 40 MG PO CAPS
40.0000 mg | ORAL_CAPSULE | Freq: Every day | ORAL | Status: DC
  Administered 2015-02-18: 40 mg via ORAL
  Filled 2015-02-17: qty 1

## 2015-02-17 MED ORDER — OXYCODONE-ACETAMINOPHEN 10-325 MG PO TABS: 1.0000 | ORAL_TABLET | ORAL | Status: DC | PRN

## 2015-02-17 MED ORDER — ACETAMINOPHEN 10 MG/ML IV SOLN
1000.0000 mg | Freq: Four times a day (QID) | INTRAVENOUS | Status: AC
  Administered 2015-02-17 – 2015-02-18 (×4): 1000 mg via INTRAVENOUS
  Filled 2015-02-17 (×4): qty 100

## 2015-02-17 MED ORDER — ONDANSETRON HCL 4 MG PO TABS: 4.0000 mg | ORAL_TABLET | Freq: Three times a day (TID) | ORAL | Status: DC | PRN

## 2015-02-17 MED ORDER — MIDAZOLAM HCL 5 MG/5ML IJ SOLN
INTRAMUSCULAR | Status: DC | PRN
  Administered 2015-02-17: 1 mg via INTRAVENOUS

## 2015-02-17 MED ORDER — 0.9 % SODIUM CHLORIDE (POUR BTL) OPTIME
TOPICAL | Status: DC | PRN
  Administered 2015-02-17: 1000 mL

## 2015-02-17 MED ORDER — PHENOL 1.4 % MT LIQD: 1.0000 | OROMUCOSAL | Status: DC | PRN

## 2015-02-17 MED ORDER — ACETAMINOPHEN 325 MG PO TABS: 325.0000 mg | ORAL_TABLET | ORAL | Status: DC | PRN

## 2015-02-17 MED ORDER — LAMOTRIGINE 100 MG PO TABS
100.0000 mg | ORAL_TABLET | Freq: Two times a day (BID) | ORAL | Status: DC
  Administered 2015-02-17 – 2015-02-18 (×2): 100 mg via ORAL
  Filled 2015-02-17 (×3): qty 1

## 2015-02-17 MED ORDER — FENTANYL CITRATE 0.05 MG/ML IJ SOLN
25.0000 ug | INTRAMUSCULAR | Status: DC | PRN
  Administered 2015-02-17 (×2): 50 ug via INTRAVENOUS

## 2015-02-17 MED ORDER — CEFAZOLIN SODIUM 1-5 GM-% IV SOLN
1.0000 g | Freq: Three times a day (TID) | INTRAVENOUS | Status: AC
  Administered 2015-02-17 (×2): 1 g via INTRAVENOUS
  Filled 2015-02-17 (×2): qty 50

## 2015-02-17 MED ORDER — FENTANYL CITRATE 0.05 MG/ML IJ SOLN
INTRAMUSCULAR | Status: AC
Start: 2015-02-17 — End: 2015-02-17
  Filled 2015-02-17: qty 5

## 2015-02-17 MED ORDER — LIDOCAINE HCL (CARDIAC) 20 MG/ML IV SOLN
INTRAVENOUS | Status: AC
  Filled 2015-02-17: qty 10

## 2015-02-17 MED ORDER — SODIUM CHLORIDE 0.9 % IJ SOLN
3.0000 mL | Freq: Two times a day (BID) | INTRAMUSCULAR | Status: DC
  Administered 2015-02-17 (×2): 3 mL via INTRAVENOUS

## 2015-02-17 MED ORDER — MIDAZOLAM HCL 2 MG/2ML IJ SOLN
INTRAMUSCULAR | Status: AC
Start: 2015-02-17 — End: 2015-02-17
  Filled 2015-02-17: qty 2

## 2015-02-17 MED ORDER — LOXAPINE SUCCINATE 10 MG PO CAPS
10.0000 mg | ORAL_CAPSULE | Freq: Three times a day (TID) | ORAL | Status: DC
  Administered 2015-02-17 – 2015-02-18 (×3): 10 mg via ORAL
  Filled 2015-02-17 (×5): qty 1

## 2015-02-17 MED ORDER — OXYCODONE HCL 5 MG PO TABS
ORAL_TABLET | ORAL | Status: AC
  Filled 2015-02-17: qty 1

## 2015-02-17 MED ORDER — SODIUM CHLORIDE 0.9 % IJ SOLN: 3.0000 mL | INTRAMUSCULAR | Status: DC | PRN

## 2015-02-17 MED ORDER — OXYCODONE HCL 5 MG PO TABS
10.0000 mg | ORAL_TABLET | ORAL | Status: DC | PRN
  Administered 2015-02-17 – 2015-02-18 (×4): 10 mg via ORAL
  Filled 2015-02-17 (×4): qty 2

## 2015-02-17 MED ORDER — GLYCOPYRROLATE 0.2 MG/ML IJ SOLN
INTRAMUSCULAR | Status: DC | PRN
  Administered 2015-02-17: 0.2 mg via INTRAVENOUS
  Administered 2015-02-17: 0.6 mg via INTRAVENOUS

## 2015-02-17 MED ORDER — EPHEDRINE SULFATE 50 MG/ML IJ SOLN
INTRAMUSCULAR | Status: AC
Start: 2015-02-17 — End: 2015-02-17
  Filled 2015-02-17: qty 1

## 2015-02-17 MED ORDER — LACTATED RINGERS IV SOLN
INTRAVENOUS | Status: DC | PRN
  Administered 2015-02-17 (×2): via INTRAVENOUS

## 2015-02-17 MED ORDER — BUPIVACAINE-EPINEPHRINE 0.25% -1:200000 IJ SOLN
INTRAMUSCULAR | Status: DC | PRN
Start: 2015-02-17 — End: 2015-02-17
  Administered 2015-02-17: 20 mL

## 2015-02-17 MED ORDER — MENTHOL 3 MG MT LOZG: 1.0000 | LOZENGE | OROMUCOSAL | Status: DC | PRN

## 2015-02-17 MED ORDER — ARTIFICIAL TEARS OP OINT
TOPICAL_OINTMENT | OPHTHALMIC | Status: DC | PRN
  Administered 2015-02-17: 1 via OPHTHALMIC

## 2015-02-17 MED ORDER — FENTANYL CITRATE 0.05 MG/ML IJ SOLN
INTRAMUSCULAR | Status: DC | PRN
  Administered 2015-02-17: 100 ug via INTRAVENOUS
  Administered 2015-02-17: 50 ug via INTRAVENOUS

## 2015-02-17 MED ORDER — METHOCARBAMOL 500 MG PO TABS: 500.0000 mg | ORAL_TABLET | Freq: Three times a day (TID) | ORAL | Status: DC | PRN

## 2015-02-17 MED ORDER — DEXAMETHASONE SODIUM PHOSPHATE 4 MG/ML IJ SOLN
INTRAMUSCULAR | Status: DC | PRN
  Administered 2015-02-17: 4 mg via INTRAVENOUS

## 2015-02-17 MED ORDER — METHOCARBAMOL 1000 MG/10ML IJ SOLN: 500.0000 mg | Freq: Four times a day (QID) | INTRAMUSCULAR | Status: DC | PRN

## 2015-02-17 MED ORDER — BUSPIRONE HCL 5 MG PO TABS
5.0000 mg | ORAL_TABLET | Freq: Three times a day (TID) | ORAL | Status: DC
  Administered 2015-02-17 – 2015-02-18 (×4): 5 mg via ORAL
  Filled 2015-02-17 (×6): qty 1

## 2015-02-17 MED ORDER — ONDANSETRON HCL 4 MG/2ML IJ SOLN
4.0000 mg | INTRAMUSCULAR | Status: DC | PRN
Start: 2015-02-17 — End: 2015-02-18

## 2015-02-17 MED ORDER — NEOSTIGMINE METHYLSULFATE 10 MG/10ML IV SOLN
INTRAVENOUS | Status: AC
  Filled 2015-02-17: qty 1

## 2015-02-17 MED ORDER — ROCURONIUM BROMIDE 100 MG/10ML IV SOLN
INTRAVENOUS | Status: DC | PRN
  Administered 2015-02-17: 30 mg via INTRAVENOUS

## 2015-02-17 SURGICAL SUPPLY — 42 items
BANDAGE ADH SHEER 1  50/CT (GAUZE/BANDAGES/DRESSINGS) ×6 IMPLANT
BLADE SURG 15 STRL LF DISP TIS (BLADE) ×1 IMPLANT
BLADE SURG 15 STRL SS (BLADE) ×2
BONE FILLER DEVICE STRL SZ3 (INSTRUMENTS) IMPLANT
CEMENT BONE KYPHX HV R (Orthopedic Implant) ×3 IMPLANT
COVER MAYO STAND STRL (DRAPES) ×3 IMPLANT
CURETTE WEDGE 8.5MM KYPHX (MISCELLANEOUS) ×3 IMPLANT
DERMABOND ADVANCED (GAUZE/BANDAGES/DRESSINGS) ×2
DERMABOND ADVANCED .7 DNX12 (GAUZE/BANDAGES/DRESSINGS) ×1 IMPLANT
DRAPE C-ARM 42X72 X-RAY (DRAPES) ×6 IMPLANT
DRAPE INCISE IOBAN 66X45 STRL (DRAPES) ×3 IMPLANT
DRAPE LAPAROTOMY T 102X78X121 (DRAPES) ×3 IMPLANT
DRAPE PROXIMA HALF (DRAPES) ×6 IMPLANT
DRSG MEPILEX BORDER 4X8 (GAUZE/BANDAGES/DRESSINGS) ×3 IMPLANT
DURAPREP 26ML APPLICATOR (WOUND CARE) ×3 IMPLANT
ELECT PENCIL ROCKER SW 15FT (MISCELLANEOUS) ×3 IMPLANT
GAUZE SPONGE 4X4 16PLY XRAY LF (GAUZE/BANDAGES/DRESSINGS) ×3 IMPLANT
GLOVE BIOGEL PI IND STRL 8 (GLOVE) ×1 IMPLANT
GLOVE BIOGEL PI INDICATOR 8 (GLOVE) ×2
GLOVE ECLIPSE 8.5 STRL (GLOVE) ×6 IMPLANT
GLOVE ORTHO TXT STRL SZ7.5 (GLOVE) ×3 IMPLANT
GOWN STRL REUS W/ TWL LRG LVL3 (GOWN DISPOSABLE) ×1 IMPLANT
GOWN STRL REUS W/TWL 2XL LVL3 (GOWN DISPOSABLE) ×6 IMPLANT
GOWN STRL REUS W/TWL LRG LVL3 (GOWN DISPOSABLE) ×2
KIT BASIN OR (CUSTOM PROCEDURE TRAY) ×3 IMPLANT
KIT ROOM TURNOVER OR (KITS) ×3 IMPLANT
NEEDLE HYPO 25X1 1.5 SAFETY (NEEDLE) ×3 IMPLANT
NEEDLE SPNL 18GX3.5 QUINCKE PK (NEEDLE) ×6 IMPLANT
NS IRRIG 1000ML POUR BTL (IV SOLUTION) ×3 IMPLANT
PACK SURGICAL SETUP 50X90 (CUSTOM PROCEDURE TRAY) ×3 IMPLANT
PACK UNIVERSAL I (CUSTOM PROCEDURE TRAY) ×3 IMPLANT
PAD ARMBOARD 7.5X6 YLW CONV (MISCELLANEOUS) ×6 IMPLANT
SURGIFLO TRUKIT (HEMOSTASIS) IMPLANT
SUT BONE WAX W31G (SUTURE) ×3 IMPLANT
SUT MON AB 3-0 SH 27 (SUTURE) ×2
SUT MON AB 3-0 SH27 (SUTURE) ×1 IMPLANT
SYR CONTROL 10ML LL (SYRINGE) ×3 IMPLANT
TOWEL OR 17X24 6PK STRL BLUE (TOWEL DISPOSABLE) ×3 IMPLANT
TOWEL OR 17X26 10 PK STRL BLUE (TOWEL DISPOSABLE) ×3 IMPLANT
TRAY KYPHOPAK 15/3 ONESTEP 1ST (MISCELLANEOUS) ×3 IMPLANT
TRAY KYPHOPAK 20/3 ONESTEP 1ST (MISCELLANEOUS) ×3 IMPLANT
WATER STERILE IRR 1000ML POUR (IV SOLUTION) ×3 IMPLANT

## 2015-02-17 NOTE — Transfer of Care (Signed)
Immediate Anesthesia Transfer of Care Note  Patient: Lauren Fuentes  Procedure(s) Performed: Procedure(s): T12 KYPHOPLASTY (N/A)  Patient Location: PACU  Anesthesia Type:General  Level of Consciousness: awake, alert  and oriented  Airway & Oxygen Therapy: Patient Spontanous Breathing and Patient connected to nasal cannula oxygen  Post-op Assessment: Report given to RN and Post -op Vital signs reviewed and stable  Post vital signs: Reviewed and stable  Last Vitals:  Filed Vitals:   02/17/15 0850  BP: 118/67  Pulse: 83  Temp: 36.4 C  Resp: 13    Complications: No apparent anesthesia complications

## 2015-02-17 NOTE — Brief Op Note (Signed)
02/17/2015  8:40 AM  PATIENT:  Lauren Fuentes  49 y.o. female  PRE-OPERATIVE DIAGNOSIS:  T12 COMPRESSION FRACTURE  POST-OPERATIVE DIAGNOSIS:  T12 COMPRESSION FRACTURE  PROCEDURE:  Procedure(s): T12 KYPHOPLASTY (N/A)  SURGEON:  Surgeon(s) and Role:    * Melina Schools, MD - Primary  PHYSICIAN ASSISTANT:   ASSISTANTS: none   ANESTHESIA:   general  EBL:  Total I/O In: 1000 [I.V.:1000] Out: -   BLOOD ADMINISTERED:none  DRAINS: none   LOCAL MEDICATIONS USED:  MARCAINE     SPECIMEN:  No Specimen  DISPOSITION OF SPECIMEN:  N/A  COUNTS:  YES  TOURNIQUET:  * No tourniquets in log *  DICTATION: .Other Dictation: Dictation Number [id number]   PLAN OF CARE: Admit for overnight observation  PATIENT DISPOSITION:  PACU - hemodynamically stable.

## 2015-02-17 NOTE — Anesthesia Postprocedure Evaluation (Signed)
  Anesthesia Post-op Note  Patient: Lauren Fuentes  Procedure(s) Performed: Procedure(s): T12 KYPHOPLASTY (N/A)  Patient Location: PACU  Anesthesia Type:General  Level of Consciousness: awake  Airway and Oxygen Therapy: Patient Spontanous Breathing  Post-op Pain: mild  Post-op Assessment: Post-op Vital signs reviewed, Patient's Cardiovascular Status Stable, Respiratory Function Stable, Patent Airway, No signs of Nausea or vomiting and Pain level controlled  Post-op Vital Signs: Reviewed and stable  Last Vitals:  Filed Vitals:   02/17/15 1231  BP: 108/66  Pulse: 65  Temp: 36.6 C  Resp: 18    Complications: No apparent anesthesia complications
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
## 2015-02-17 NOTE — Anesthesia Procedure Notes (Signed)
Procedure Name: Intubation Date/Time: 02/17/2015 7:30 AM Performed by: [place] Pink Pre-anesthesia Checklist: Patient identified, Emergency Drugs available, Suction available, Patient being monitored and Timeout performed Patient Re-evaluated:Patient Re-evaluated prior to inductionOxygen Delivery Method: Circle system utilized Preoxygenation: Pre-oxygenation with 100% oxygen Intubation Type: IV induction Ventilation: Mask ventilation without difficulty Laryngoscope Size: Mac and 3 Grade View: Grade I Tube type: Oral Tube size: 7.5 mm Number of attempts: 1 Airway Equipment and Method: Stylet and LTA kit utilized Placement Confirmation: ETT inserted through vocal cords under direct vision,  positive ETCO2 and breath sounds checked- equal and bilateral Secured at: 20 cm Tube secured with: Tape Dental Injury: Teeth and Oropharynx as per pre-operative assessment

## 2015-02-17 NOTE — Anesthesia Preprocedure Evaluation (Signed)
Anesthesia Evaluation  Patient identified by MRN, date of birth, ID bandGeneral Assessment Comment:Difficult to keep awake  History of Anesthesia Complications Negative for: history of anesthetic complications  Airway Mallampati: II  TM Distance: >3 FB Neck ROM: Full    Dental  (+) Teeth Intact,    Pulmonary neg shortness of breath, neg sleep apnea, neg COPDneg recent URI, Current Smoker,  breath sounds clear to auscultation        Cardiovascular negative cardio ROS  Rhythm:Regular     Neuro/Psych  Headaches, PSYCHIATRIC DISORDERS Bipolar Disorder Schizophrenia t12 compression fracture    GI/Hepatic GERD-  Controlled,(+) Hepatitis -, Unspecified  Endo/Other  negative endocrine ROS  Renal/GU negative Renal ROS     Musculoskeletal  (+) Arthritis -,   Abdominal   Peds  Hematology negative hematology ROS (+)   Anesthesia Other Findings   Reproductive/Obstetrics                             Anesthesia Physical Anesthesia Plan  ASA: II  Anesthesia Plan: General   Post-op Pain Management:    Induction: Intravenous  Airway Management Planned: Oral ETT  Additional Equipment: None  Intra-op Plan:   Post-operative Plan: Extubation in OR  Informed Consent: I have reviewed the patients History and Physical, chart, labs and discussed the procedure including the risks, benefits and alternatives for the proposed anesthesia with the patient or authorized representative who has indicated his/her understanding and acceptance.   Dental advisory given  Plan Discussed with: CRNA and Surgeon  Anesthesia Plan Comments:         Anesthesia Quick Evaluation

## 2015-02-17 NOTE — Plan of Care (Signed)
Problem: Consults Goal: Diagnosis - Spinal Surgery Outcome: Completed/Met Date Met:  02/17/15 T12 KYPHOPLASTY     

## 2015-02-18 ENCOUNTER — Encounter (HOSPITAL_COMMUNITY): Payer: Self-pay | Admitting: Orthopedic Surgery

## 2015-02-18 DIAGNOSIS — Z23 Encounter for immunization: Secondary | ICD-10-CM | POA: Diagnosis not present

## 2015-02-18 DIAGNOSIS — F259 Schizoaffective disorder, unspecified: Secondary | ICD-10-CM | POA: Diagnosis not present

## 2015-02-18 DIAGNOSIS — M8588 Other specified disorders of bone density and structure, other site: Secondary | ICD-10-CM | POA: Diagnosis not present

## 2015-02-18 DIAGNOSIS — K219 Gastro-esophageal reflux disease without esophagitis: Secondary | ICD-10-CM | POA: Diagnosis not present

## 2015-02-18 DIAGNOSIS — M8468XA Pathological fracture in other disease, other site, initial encounter for fracture: Secondary | ICD-10-CM | POA: Diagnosis not present

## 2015-02-18 DIAGNOSIS — F319 Bipolar disorder, unspecified: Secondary | ICD-10-CM | POA: Diagnosis not present

## 2015-02-18 NOTE — Progress Notes (Signed)
    Subjective: Procedure(s) (LRB): T12 KYPHOPLASTY (N/A) 1 Day Post-Op  Patient reports pain as 3 on 0-10 scale.  Reports decreased leg pain reports incisional back pain   Positive void Negative bowel movement Positive flatus Negative chest pain or shortness of breath  Objective: Vital signs in last 24 hours: Temp:  [97.2 F (36.2 C)-98.4 F (36.9 C)] 98.4 F (36.9 C) (02/26 0420) Pulse Rate:  [60-84] 74 (02/26 0420) Resp:  [13-25] 18 (02/26 0420) BP: (99-118)/(47-67) 103/60 mmHg (02/26 0420) SpO2:  [93 %-100 %] 96 % (02/26 0420)  Intake/Output from previous day: 02/25 0701 - 02/26 0700 In: 2660 [P.O.:1560; I.V.:1100] Out: 5 [Blood:5]  Labs:  Recent Labs  02/16/15 1458  WBC 6.7  RBC 4.58  HCT 42.7  PLT 312    Recent Labs  02/16/15 1458  NA 136  K 4.7  CL 99  CO2 32  BUN 8  CREATININE 1.02  GLUCOSE 91  CALCIUM 9.5   No results for input(s): LABPT, INR in the last 72 hours.  Physical Exam: Neurologically intact ABD soft Intact pulses distally Incision: dressing C/D/I Compartment soft  Assessment/Plan: Patient stable  xrays n/a Continue mobilization with physical therapy Continue care  Up with therapy D/C IV fluids  Doing well - ok for d/c to shelter per her original plan  Melina Schools, MD [place] [phone]

## 2015-02-18 NOTE — Progress Notes (Signed)
Patient alert and oriented, mae's well, voiding adequate amount of urine, swallowing without difficulty, c/o minimal  pain. Patient discharged home with family. Script and discharged instructions given to patient. Patient and family stated understanding of d/c instructions given and has an appointment with MD. Aisha Rin Gorton RN  
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
## 2015-02-18 NOTE — Progress Notes (Signed)
UR completed 
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
## 2015-02-18 NOTE — Discharge Summary (Signed)
Patient ID: Lauren Fuentes MRN: [id number] DOB/AGE: Feb 29, 1966 49 y.o.  Admit date: 02/17/2015 Discharge date: 02/18/2015  Admission Diagnoses:  Active Problems:   Compression fracture of body of thoracic vertebra   Discharge Diagnoses:  Active Problems:   Compression fracture of body of thoracic vertebra  status post Procedure(s): T12 KYPHOPLASTY  Past Medical History  Diagnosis Date  . Bipolar 1 disorder   . Schizoaffective disorder   . Post traumatic stress disorder (PTSD)   . Calcaneal fracture, Left  10/19/2014  . Thoracic compression fracture, T12 endplate  73/71/0626  . Lumbar compression fracture, L1-2 endplate  94/85/4627  . Fracture of metatarsal bone of right foot, 4th neck 10/22/2014  . Back pain, chronic   . GERD (gastroesophageal reflux disease)   . Headache     hx migraines, tension headaches  . Arthritis   . Hepatitis     stated in the past    Surgeries: Procedure(s): T12 KYPHOPLASTY on 02/17/2015   Consultants:    Discharged Condition: Improved  Hospital Course: Lauren Fuentes is an 49 y.o. female who was admitted 02/17/2015 for operative treatment of compression fracture of T12. Patient failed conservative treatments (please see the history and physical for the specifics) and had severe unremitting pain that affects sleep, daily activities and work/hobbies. After pre-op clearance, the patient was taken to the operating room on 02/17/2015 and underwent  Procedure(s): T12 KYPHOPLASTY.    Patient was given perioperative antibiotics: Anti-infectives    Start     Dose/Rate Route Frequency Ordered Stop   02/17/15 1500  ceFAZolin (ANCEF) IVPB 1 g/50 mL premix     1 g 100 mL/hr over 30 Minutes Intravenous Every 8 hours 02/17/15 1013 02/17/15 2241   02/17/15 1015  valACYclovir (VALTREX) tablet 1,000 mg  Status:  Discontinued     1,000 mg Oral 2 times daily 02/17/15 1012 02/17/15 1101   02/16/15 1357  ceFAZolin (ANCEF) IVPB 2 g/50 mL premix     2 g 100  mL/hr over 30 Minutes Intravenous 30 min pre-op 02/16/15 1357 02/17/15 0735       Patient was given sequential compression devices and early ambulation to prevent DVT.   Patient benefited maximally from hospital stay and there were no complications. At the time of discharge, the patient was urinating/moving their bowels without difficulty, tolerating a regular diet, pain is controlled with oral pain medications and they have been cleared by PT/OT.   Recent vital signs: Patient Vitals for the past 24 hrs:  BP Temp Temp src Pulse Resp SpO2  02/18/15 0420 103/60 mmHg 98.4 F (36.9 C) Oral 74 18 96 %  02/17/15 2346 (!) 99/48 mmHg 98.1 F (36.7 C) Oral 60 18 98 %  02/17/15 2012 (!) 103/58 mmHg 98 F (36.7 C) Oral 75 20 97 %  02/17/15 1618 (!) 111/56 mmHg 97.8 F (36.6 C) - 76 20 98 %  02/17/15 1231 108/66 mmHg 97.9 F (36.6 C) - 65 18 99 %  02/17/15 1009 118/66 mmHg 97.7 F (36.5 C) - 70 20 96 %  02/17/15 0954 - - - 78 - 93 %  02/17/15 0950 (!) 105/55 mmHg 97.2 F (36.2 C) - 74 20 96 %  02/17/15 0945 - - - 84 20 100 %  02/17/15 0935 106/63 mmHg - - 73 19 97 %  02/17/15 0930 - - - 66 16 99 %  02/17/15 0920 (!) 99/47 mmHg - - 69 (!) 25 100 %  02/17/15 0915 - - -  77 (!) 24 100 %  02/17/15 0905 (!) 114/51 mmHg - - 79 15 100 %  02/17/15 0900 - - - 82 18 100 %  02/17/15 0850 118/67 mmHg 97.6 F (36.4 C) - 83 13 100 %     Recent laboratory studies:  Recent Labs  02/16/15 1458  WBC 6.7  HGB 14.0  HCT 42.7  PLT 312  NA 136  K 4.7  CL 99  CO2 32  BUN 8  CREATININE 1.02  GLUCOSE 91  CALCIUM 9.5     Discharge Medications:     Medication List    STOP taking these medications        DSS 100 MG Caps     hydrocortisone cream 1 %     meloxicam 7.5 MG tablet  Commonly known as:  MOBIC     oxyCODONE 5 MG immediate release tablet  Commonly known as:  Oxy IR/ROXICODONE     traMADol 50 MG tablet  Commonly known as:  ULTRAM      TAKE these medications        aspirin  EC 325 MG tablet  Take 1 tablet (325 mg total) by mouth 2 (two) times daily.     busPIRone 5 MG tablet  Commonly known as:  BUSPAR  Take 5 mg by mouth 3 (three) times daily.     FLUoxetine 40 MG capsule  Commonly known as:  PROZAC  Take 40 mg by mouth daily.     fluticasone 50 MCG/ACT nasal spray  Commonly known as:  FLONASE  Place 2 sprays into the nose daily.     hydrOXYzine 25 MG tablet  Commonly known as:  ATARAX/VISTARIL  Take 1 tablet (25 mg total) by mouth every 6 (six) hours as needed for itching.     lamoTRIgine 100 MG tablet  Commonly known as:  LAMICTAL  Take 100 mg by mouth 2 (two) times daily.     loxapine 10 MG capsule  Commonly known as:  LOXITANE  Take 10 mg by mouth 3 (three) times daily.     methocarbamol 500 MG tablet  Commonly known as:  ROBAXIN  Take 1 tablet (500 mg total) by mouth 3 (three) times daily as needed for muscle spasms.     ondansetron 4 MG tablet  Commonly known as:  ZOFRAN  Take 1 tablet (4 mg total) by mouth every 8 (eight) hours as needed for nausea or vomiting.     oxyCODONE-acetaminophen 10-325 MG per tablet  Commonly known as:  PERCOCET  Take 1 tablet by mouth every 4 (four) hours as needed for pain.     traZODone 150 MG tablet  Commonly known as:  DESYREL  Take 300 mg by mouth at bedtime.     valACYclovir 1000 MG tablet  Commonly known as:  VALTREX  Take 1,000 mg by mouth 2 (two) times daily.        Diagnostic Studies: Dg Thoracolumabar Spine  02/17/2015   CLINICAL DATA:  Painful compression fracture.  EXAM: TIME:  Fluoroscopy Time (in minutes and seconds):  31 seconds  Number of Acquired Images:  Two  COMPARISON:  MRI of the lumbar spine 10/21/2014.  FINDINGS: AP and lateral spot films demonstrate vertebral augmentation at T12 via a bipedicular approach. Extraosseous migration of methylmethacrylate fills a paravertebral vein on the RIGHT. No posterior extension into the foramen or spinal canal.  IMPRESSION: T12 fracture as  described.   Electronically Signed   By: Michae Kava.D.  On: 02/17/2015 09:02   Dg C-arm 1-60 Min  02/17/2015   CLINICAL DATA:  Painful compression fracture.  EXAM: TIME:  Fluoroscopy Time (in minutes and seconds):  31 seconds  Number of Acquired Images:  Two  COMPARISON:  MRI of the lumbar spine 10/21/2014.  FINDINGS: AP and lateral spot films demonstrate vertebral augmentation at T12 via a bipedicular approach. Extraosseous migration of methylmethacrylate fills a paravertebral vein on the RIGHT. No posterior extension into the foramen or spinal canal.  IMPRESSION: T12 fracture as described.   Electronically Signed   By: Rolla Flatten M.D.   On: 02/17/2015 09:02          Follow-up Information    Follow up with Melina Schools D, MD. Schedule an appointment as soon as possible for a visit in 2 weeks.   Specialty:  Orthopedic Surgery   Why:  For suture removal, For wound re-check   Contact information:   [address] [place] [address] [phone]       Discharge Plan:  discharge to shelter  Disposition: patient doing well.  Ambulating, and pain well controlled.  Will plan on returning to homeless shelter and f/u with me in 2 weeks.    Signed: Melina Schools D for Dr. Melina Schools [facility] [phone] 02/18/2015, 8:22 AM

## 2015-02-25 DIAGNOSIS — R109 Unspecified abdominal pain: Secondary | ICD-10-CM | POA: Diagnosis not present

## 2015-03-04 DIAGNOSIS — Z4789 Encounter for other orthopedic aftercare: Secondary | ICD-10-CM | POA: Diagnosis not present

## 2015-03-04 DIAGNOSIS — S32020D Wedge compression fracture of second lumbar vertebra, subsequent encounter for fracture with routine healing: Secondary | ICD-10-CM | POA: Diagnosis not present

## 2015-03-29 DIAGNOSIS — Z1231 Encounter for screening mammogram for malignant neoplasm of breast: Secondary | ICD-10-CM | POA: Diagnosis not present

## 2015-03-30 DIAGNOSIS — S92341D Displaced fracture of fourth metatarsal bone, right foot, subsequent encounter for fracture with routine healing: Secondary | ICD-10-CM | POA: Diagnosis not present

## 2015-03-30 DIAGNOSIS — S92002D Unspecified fracture of left calcaneus, subsequent encounter for fracture with routine healing: Secondary | ICD-10-CM | POA: Diagnosis not present

## 2015-04-01 DIAGNOSIS — Z4789 Encounter for other orthopedic aftercare: Secondary | ICD-10-CM | POA: Diagnosis not present

## 2015-04-01 DIAGNOSIS — S32020D Wedge compression fracture of second lumbar vertebra, subsequent encounter for fracture with routine healing: Secondary | ICD-10-CM | POA: Diagnosis not present

## 2015-04-13 ENCOUNTER — Ambulatory Visit: Payer: Medicare Other | Attending: Orthopedic Surgery

## 2015-04-13 DIAGNOSIS — M545 Low back pain, unspecified: Secondary | ICD-10-CM

## 2015-04-13 DIAGNOSIS — R262 Difficulty in walking, not elsewhere classified: Secondary | ICD-10-CM | POA: Diagnosis not present

## 2015-04-13 DIAGNOSIS — M2560 Stiffness of unspecified joint, not elsewhere classified: Secondary | ICD-10-CM | POA: Diagnosis not present

## 2015-04-13 DIAGNOSIS — R6889 Other general symptoms and signs: Secondary | ICD-10-CM

## 2015-04-13 DIAGNOSIS — R293 Abnormal posture: Secondary | ICD-10-CM
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
## 2015-04-20 DIAGNOSIS — M545 Low back pain: Secondary | ICD-10-CM | POA: Diagnosis not present

## 2015-04-20 DIAGNOSIS — Z720 Tobacco use: Secondary | ICD-10-CM | POA: Diagnosis not present

## 2015-04-20 DIAGNOSIS — R1084 Generalized abdominal pain: Secondary | ICD-10-CM | POA: Diagnosis not present

## 2015-04-20 DIAGNOSIS — Z9851 Tubal ligation status: Secondary | ICD-10-CM | POA: Diagnosis not present

## 2015-04-20 DIAGNOSIS — Z79899 Other long term (current) drug therapy: Secondary | ICD-10-CM | POA: Diagnosis not present

## 2015-04-20 DIAGNOSIS — F2089 Other schizophrenia: Secondary | ICD-10-CM | POA: Diagnosis not present

## 2015-04-20 DIAGNOSIS — N390 Urinary tract infection, site not specified: Secondary | ICD-10-CM | POA: Diagnosis not present

## 2015-04-20 DIAGNOSIS — M5136 Other intervertebral disc degeneration, lumbar region: Secondary | ICD-10-CM | POA: Diagnosis not present

## 2015-04-20 DIAGNOSIS — F319 Bipolar disorder, unspecified: Secondary | ICD-10-CM | POA: Diagnosis not present

## 2015-04-20 DIAGNOSIS — M549 Dorsalgia, unspecified: Secondary | ICD-10-CM | POA: Diagnosis not present

## 2015-04-25 ENCOUNTER — Ambulatory Visit: Payer: Medicare Other | Attending: Orthopedic Surgery | Admitting: Physical Therapy

## 2015-04-25 DIAGNOSIS — M545 Low back pain, unspecified: Secondary | ICD-10-CM

## 2015-04-25 DIAGNOSIS — M2560 Stiffness of unspecified joint, not elsewhere classified: Secondary | ICD-10-CM | POA: Insufficient documentation

## 2015-04-25 DIAGNOSIS — R293 Abnormal posture: Secondary | ICD-10-CM | POA: Insufficient documentation

## 2015-04-25 DIAGNOSIS — R6889 Other general symptoms and signs: Secondary | ICD-10-CM | POA: Insufficient documentation

## 2015-04-25 DIAGNOSIS — R262 Difficulty in walking, not elsewhere classified: Secondary | ICD-10-CM | POA: Insufficient documentation

## 2015-04-25 NOTE — Therapy (Signed)
[place], [place], [address] Phone: [phone]   Fax:  [phone]  Physical Therapy Treatment  Patient Details  Name: Lauren Fuentes MRN: [id number] Date of Birth: 06/09/1966 Referring Provider:  Chipper Herb, MD  Encounter Date: 04/25/2015      PT End of Session - 04/25/15 1157    Visit Number 2   Number of Visits 12   Date for PT Re-Evaluation 05/25/15   PT Start Time 1155   PT Stop Time 1234   PT Time Calculation (min) 39 min      Past Medical History  Diagnosis Date  . Bipolar 1 disorder   . Schizoaffective disorder   . Post traumatic stress disorder (PTSD)   . Calcaneal fracture, Left  10/19/2014  . Thoracic compression fracture, T12 endplate  97/35/3299  . Lumbar compression fracture, L1-2 endplate  24/26/8341  . Fracture of metatarsal bone of right foot, 4th neck 10/22/2014  . Back pain, chronic   . GERD (gastroesophageal reflux disease)   . Headache     hx migraines, tension headaches  . Arthritis   . Hepatitis     stated in the past    Past Surgical History  Procedure Laterality Date  . Tubal ligation    . Orif calcaneous fracture Left 10/21/2014    Procedure: OPEN REDUCTION INTERNAL FIXATION (ORIF) LEFT CALCANEOUS FRACTURE;  Surgeon: Rozanna Box, MD;  Location: [facility];  Service: Orthopedics;  Laterality: Left;  Marland Kitchen Kyphoplasty N/A 02/17/2015    Procedure: T12 KYPHOPLASTY;  Surgeon: Melina Schools, MD;  Location: [facility];  Service: Orthopedics;  Laterality: N/A;    There were no vitals filed for this visit.  Visit Diagnosis:  Bilateral low back pain without sciatica  Difficulty walking  Abnormal posture  Joint stiffness of spine  Activity intolerance      Subjective Assessment - 04/25/15 1157    Subjective I had to go to the hospital because I was hurting so bad in my back. I had a UTI and was given antibiotics. The electrical stimulation helped.    Currently in  Pain? Yes   Pain Score 7    Pain Location Back   Pain Descriptors / Indicators Aching;Burning;Throbbing   Pain Frequency Constant   Aggravating Factors  laying on back   Pain Relieving Factors lying down on side                         [facility] Adult PT Treatment/Exercise - 04/25/15 1212    Lumbar Exercises: Stretches   Single Knee to Chest Stretch 3 reps;30 seconds   Single Knee to Chest Stretch Limitations uncomfortable lying supine   Lower Trunk Rotation 5 reps   Lower Trunk Rotation Limitations small ROM due to pain   Lumbar Exercises: Aerobic   Stationary Bike Nustep Level 1 UE/LE x 3 min 30 sec, pt c/o increased LBP so discontinued   Lumbar Exercises: Sidelying   Clam 10 reps  with ab set   Clam Limitations also reverse clam with ab set   Modalities   Modalities Cryotherapy   Cryotherapy   Number Minutes Cryotherapy 12 Minutes   Cryotherapy Location Back  performed self care   Type of Cryotherapy Ice pack   Electrical Stimulation   Electrical Stimulation Location back  performed during self care    Electrical Stimulation Action IFC   Electrical Stimulation Parameters L8   Electrical Stimulation Goals Pain  PT Education - 04/25/15 1237    Education provided Yes   Education Details Self Care: Biomedical scientist Handout Discussed with Patient, HEP: knee to chest and trunk rotations    Person(s) Educated Patient   Methods Explanation;Handout   Comprehension Verbalized understanding          PT Short Term Goals - 04/25/15 1245    PT SHORT TERM GOAL #1   Title she will be able to perform all inital HEP   Time 4   Period Weeks   Status On-going   PT SHORT TERM GOAL #2   Title She will report pain decreased at least 25%  and she is able to stand for 15 min or more   Time 4   Period Weeks   Status On-going   PT SHORT TERM GOAL #3   Title she will report able to walk for 15-20 min before need to sit   Time 4    Period Weeks   Status On-going           PT Long Term Goals - 04/25/15 1246    PT LONG TERM GOAL #1   Title she will be able to perform all HEP issue as of last visit   Time 8   Period Weeks   Status On-going   PT LONG TERM GOAL #2   Title She will report pain reduced 50% or more with normal activity at home   Time 8   Period Weeks   Status On-going   PT LONG TERM GOAL #3   Title She will report able to stand and do home tasks for 30-4 min before need to sit   Time 8   Period Weeks   Status On-going   PT LONG TERM GOAL #4   Title She will walk for 30 min before she needs to stop for pain   Time 8   Period Weeks   Status On-going               Plan - 04/25/15 1238    Clinical Impression Statement Pt unable to maintain any position for very long. She reports her pain is less today than it has been. She was instructed in proper posture and body mechanics to decrease the strain on her back and reports this will help her a great deal because she has been doing everything wrong. Trial of ice with IFC in sitting due to tenderness in lumbar spine. Pt repoted increased pain after 12 minutes likely due to prolonged position, so it was discontinued. She was given two lumbar stretches for hep and instructed to perform gently without paain increase.    PT Next Visit Plan Manual STW, modalities , review or start HEP. posture ed        Problem List Patient Active Problem List   Diagnosis Date Noted  . Compression fracture of body of thoracic vertebra 02/17/2015  . Lumbar compression fracture, L1-2 endplate  38/18/2993  . Thoracic compression fracture, T12 endplate  71/69/6789  . Fracture of metatarsal bone of right foot, 4th neck 10/22/2014  . Has jumped from building 10/22/2014  . Bipolar 1 disorder   . Schizoaffective disorder   . Calcaneal fracture, Left  10/19/2014    Dorene Ar, PTA 04/25/2015, 12:53 PM  [place], [place], [address] Phone: [phone]   Fax:  [phone]

## 2015-04-25 NOTE — Patient Instructions (Addendum)
Knee to Chest (Flexion)   Pull knee toward chest. Feel stretch in lower back or buttock area. Breathing deeply, Hold _30___ seconds. Repeat with other knee. Repeat __3__ times. Each side Do ___2_ sessions per day.  http://gt2.exer.us/226   Copyright  VHI. All rights reserved.   Lower Trunk Rotation Stretch   Keeping back flat and feet together, rotate knees to left side. Hold ___30_ seconds. Repeat __3_ times per set. . Do __2__ sessions per day.    Sleeping on Back  Place pillow under knees. A pillow with cervical support and a roll around waist are also helpful. Copyright  VHI. All rights reserved.  Sleeping on Side Place pillow between knees. Use cervical support under neck and a roll around waist as needed. Copyright  VHI. All rights reserved.   Sleeping on Stomach   If this is the only desirable sleeping position, place pillow under lower legs, and under stomach or chest as needed.  Posture - Sitting   Sit upright, head facing forward. Try using a roll to support lower back. Keep shoulders relaxed, and avoid rounded back. Keep hips level with knees. Avoid crossing legs for long periods. Stand to Sit / Sit to Stand   To sit: Bend knees to lower self onto front edge of chair, then scoot back on seat. To stand: Reverse sequence by placing one foot forward, and scoot to front of seat. Use rocking motion to stand up.   Work Height and Reach  Ideal work height is no more than 2 to 4 inches below elbow level when standing, and at elbow level when sitting. Reaching should be limited to arm's length, with elbows slightly bent.  Bending  Bend at hips and knees, not back. Keep feet shoulder-width apart.    Posture - Standing   Good posture is important. Avoid slouching and forward head thrust. Maintain curve in low back and align ears over shoul- ders, hips over ankles.  Alternating Positions   Alternate tasks and change positions frequently to reduce fatigue and  muscle tension. Take rest breaks. Computer Work   Position work to Programmer, multimedia. Use proper work and seat height. Keep shoulders back and down, wrists straight, and elbows at right angles. Use chair that provides full back support. Add footrest and lumbar roll as needed.  Getting Into / Out of Car  Lower self onto seat, scoot back, then bring in one leg at a time. Reverse sequence to get out.  Dressing  Lie on back to pull socks or slacks over feet, or sit and bend leg while keeping back straight.    Housework - Sink  Place one foot on ledge of cabinet under sink when standing at sink for prolonged periods.   Pushing / Pulling  Pushing is preferable to pulling. Keep back in proper alignment, and use leg muscles to do the work.  Deep Squat   Squat and lift with both arms held against upper trunk. Tighten stomach muscles without holding breath. Use smooth movements to avoid jerking.  Avoid Twisting   Avoid twisting or bending back. Pivot around using foot movements, and bend at knees if needed when reaching for articles.  Carrying Luggage   Distribute weight evenly on both sides. Use a cart whenever possible. Do not twist trunk. Move body as a unit.   Lifting Principles .Maintain proper posture and head alignment. .Slide object as close as possible before lifting. .Move obstacles out of the way. .Test before lifting; ask for help if too  heavy. .Tighten stomach muscles without holding breath. .Use smooth movements; do not jerk. .Use legs to do the work, and pivot with feet. .Distribute the work load symmetrically and close to the center of trunk. .Push instead of pull whenever possible.   Ask For Help   Ask for help and delegate to others when possible. Coordinate your movements when lifting together, and maintain the low back curve.  Log Roll   Lying on back, bend left knee and place left arm across chest. Roll all in one movement to the right. Reverse to roll  to the left. Always move as one unit. Housework - Sweeping  Use long-handled equipment to avoid stooping.   Housework - Wiping  Position yourself as close as possible to reach work surface. Avoid straining your back.  Laundry - Unloading Wash   To unload small items at bottom of washer, lift leg opposite to arm being used to reach.  [place] close to area to be raked. Use arm movements to do the work. Keep back straight and avoid twisting.     Cart  When reaching into cart with one arm, lift opposite leg to keep back straight.   Getting Into / Out of Bed  Lower self to lie down on one side by raising legs and lowering head at the same time. Use arms to assist moving without twisting. Bend both knees to roll onto back if desired. To sit up, start from lying on side, and use same move-ments in reverse. Housework - Vacuuming  Hold the vacuum with arm held at side. Step back and forth to move it, keeping head up. Avoid twisting.   Laundry - IT consultant so that bending and twisting can be avoided.   Laundry - Unloading Dryer  Squat down to reach into clothes dryer or use a reacher.  Gardening - Weeding / Probation officer or Kneel. Knee pads may be helpful.

## 2015-04-28 ENCOUNTER — Ambulatory Visit: Payer: Medicare Other | Admitting: Physical Therapy

## 2015-05-02 ENCOUNTER — Ambulatory Visit: Payer: Medicare Other

## 2015-05-02 DIAGNOSIS — R293 Abnormal posture: Secondary | ICD-10-CM

## 2015-05-02 DIAGNOSIS — M545 Low back pain, unspecified: Secondary | ICD-10-CM

## 2015-05-02 DIAGNOSIS — M2560 Stiffness of unspecified joint, not elsewhere classified: Secondary | ICD-10-CM | POA: Diagnosis not present

## 2015-05-02 DIAGNOSIS — R262 Difficulty in walking, not elsewhere classified: Secondary | ICD-10-CM | POA: Diagnosis not present

## 2015-05-02 DIAGNOSIS — R6889 Other general symptoms and signs: Secondary | ICD-10-CM

## 2015-05-02 NOTE — Therapy (Signed)
[place], [place], [address] Phone: [phone]   Fax:  [phone]  Physical Therapy Treatment  Patient Details  Name: Lauren Fuentes MRN: [id number] Date of Birth: 1966/05/13 Referring Provider:  Chipper Herb, MD  Encounter Date: 05/02/2015      PT End of Session - 05/02/15 1407    Visit Number 3   Number of Visits 12   Date for PT Re-Evaluation 05/25/15   PT Start Time 0125   PT Stop Time 0220   PT Time Calculation (min) 55 min   Activity Tolerance Patient limited by pain   Behavior During Therapy Anxious      Past Medical History  Diagnosis Date  . Bipolar 1 disorder   . Schizoaffective disorder   . Post traumatic stress disorder (PTSD)   . Calcaneal fracture, Left  10/19/2014  . Thoracic compression fracture, T12 endplate  26/37/8588  . Lumbar compression fracture, L1-2 endplate  50/27/7412  . Fracture of metatarsal bone of right foot, 4th neck 10/22/2014  . Back pain, chronic   . GERD (gastroesophageal reflux disease)   . Headache     hx migraines, tension headaches  . Arthritis   . Hepatitis     stated in the past    Past Surgical History  Procedure Laterality Date  . Tubal ligation    . Orif calcaneous fracture Left 10/21/2014    Procedure: OPEN REDUCTION INTERNAL FIXATION (ORIF) LEFT CALCANEOUS FRACTURE;  Surgeon: Rozanna Box, MD;  Location: [facility];  Service: Orthopedics;  Laterality: Left;  Marland Kitchen Kyphoplasty N/A 02/17/2015    Procedure: T12 KYPHOPLASTY;  Surgeon: Melina Schools, MD;  Location: [facility];  Service: Orthopedics;  Laterality: N/A;    There were no vitals filed for this visit.  Visit Diagnosis:  Bilateral low back pain without sciatica  Abnormal posture  Activity intolerance      Subjective Assessment - 05/02/15 1325    Subjective A little back pain today . Foot bruised from hitting foot coming out door.    Currently in Pain? Yes   Pain Score 4    Pain  Location Back   Pain Orientation Posterior   Pain Descriptors / Indicators Aching;Burning   Pain Type Acute pain;Chronic pain   Pain Onset More than a month ago   Pain Frequency Constant   Aggravating Factors  Lying on back   Pain Relieving Factors lying on side   Multiple Pain Sites No                         [facility] Adult PT Treatment/Exercise - 05/02/15 1327    Exercises   Exercises Lumbar   Lumbar Exercises: Stretches   Single Knee to Chest Stretch 2 reps;30 seconds   Lower Trunk Rotation 2 reps;30 seconds   Lower Trunk Rotation Limitations done from sidelye and able to tolerate end range stretch    Lumbar Exercises: Aerobic   Stationary Bike L2 6 min .    Lumbar Exercises: Seated   Sit to Stand 15 reps  Done with push into foam roller for abdominal involvement   Other Seated Lumbar Exercises Rotation  x 8 RT and LT with one  hand on foam roller and other reaching behind.    Lumbar Exercises: Supine   Ab Set 10 reps  painful   Glut Set 15 reps  painful   Lumbar Exercises: Sidelying   Clam 15 reps  cues for pelvic  position   Hip Abduction 15 reps  cued for pelvic position.    Lumbar Exercises: Quadruped   Single Arm Raise Right;Left  8 reps   Straight Leg Raise --  RT and LT  8 reps   Cryotherapy   Number Minutes Cryotherapy 15 Minutes   Cryotherapy Location Back   Type of Cryotherapy Ice pack   Electrical Stimulation   Electrical Stimulation Location back   Electrical Stimulation Action IFC   Electrical Stimulation Parameters L8   Electrical Stimulation Goals Pain                  PT Short Term Goals - 04/25/15 1245    PT SHORT TERM GOAL #1   Title she will be able to perform all inital HEP   Time 4   Period Weeks   Status On-going   PT SHORT TERM GOAL #2   Title She will report pain decreased at least 25%  and she is able to stand for 15 min or more   Time 4   Period Weeks   Status On-going   PT SHORT TERM GOAL #3   Title  she will report able to walk for 15-20 min before need to sit   Time 4   Period Weeks   Status On-going           PT Long Term Goals - 04/25/15 1246    PT LONG TERM GOAL #1   Title she will be able to perform all HEP issue as of last visit   Time 8   Period Weeks   Status On-going   PT LONG TERM GOAL #2   Title She will report pain reduced 50% or more with normal activity at home   Time 8   Period Weeks   Status On-going   PT LONG TERM GOAL #3   Title She will report able to stand and do home tasks for 30-4 min before need to sit   Time 8   Period Weeks   Status On-going   PT LONG TERM GOAL #4   Title She will walk for 30 min before she needs to stop for pain   Time 8   Period Weeks   Status On-going               Plan - 05/02/15 1408    Clinical Impression Statement Pain improved thiugh all activity caused pain. She ws able to complete activites even with pain. She is very focused on pain but ,again, she  cooperated with exercises.     PT Next Visit Plan Start HEP, STW modalities   Consulted and Agree with Plan of Care Patient        Problem List Patient Active Problem List   Diagnosis Date Noted  . Compression fracture of body of thoracic vertebra 02/17/2015  . Lumbar compression fracture, L1-2 endplate  16/09/9603  . Thoracic compression fracture, T12 endplate  54/08/8118  . Fracture of metatarsal bone of right foot, 4th neck 10/22/2014  . Has jumped from building 10/22/2014  . Bipolar 1 disorder   . Schizoaffective disorder   . Calcaneal fracture, Left  10/19/2014    Darrel Hoover PT 05/02/2015, 2:11 PM  [facility] [address] [place], [place], [address] Phone: [phone]   Fax:  [phone]

## 2015-05-05 ENCOUNTER — Encounter: Payer: Medicare Other | Admitting: Physical Therapy

## 2015-05-05 ENCOUNTER — Ambulatory Visit: Payer: Medicare Other

## 2015-05-05 DIAGNOSIS — R262 Difficulty in walking, not elsewhere classified: Secondary | ICD-10-CM

## 2015-05-05 DIAGNOSIS — M545 Low back pain, unspecified: Secondary | ICD-10-CM

## 2015-05-05 DIAGNOSIS — R6889 Other general symptoms and signs: Secondary | ICD-10-CM | POA: Diagnosis not present

## 2015-05-05 DIAGNOSIS — M2560 Stiffness of unspecified joint, not elsewhere classified: Secondary | ICD-10-CM | POA: Diagnosis not present

## 2015-05-05 DIAGNOSIS — R293 Abnormal posture: Secondary | ICD-10-CM

## 2015-05-05 NOTE — Therapy (Signed)
[place], [place], [address] Phone: [phone]   Fax:  [phone]  Physical Therapy Treatment  Patient Details  Name: Lauren Fuentes MRN: [id number] Date of Birth: 1966/04/29 Referring Provider:  Chipper Herb, MD  Encounter Date: 05/05/2015      PT End of Session - 05/05/15 1710    Visit Number 4   Number of Visits 12   Date for PT Re-Evaluation 05/25/15   PT Start Time 0122   PT Stop Time 0215   PT Time Calculation (min) 53 min   Activity Tolerance Patient tolerated treatment well   Behavior During Therapy [facility] for tasks assessed/performed      Past Medical History  Diagnosis Date  . Bipolar 1 disorder   . Schizoaffective disorder   . Post traumatic stress disorder (PTSD)   . Calcaneal fracture, Left  10/19/2014  . Thoracic compression fracture, T12 endplate  21/19/4174  . Lumbar compression fracture, L1-2 endplate  08/06/4817  . Fracture of metatarsal bone of right foot, 4th neck 10/22/2014  . Back pain, chronic   . GERD (gastroesophageal reflux disease)   . Headache     hx migraines, tension headaches  . Arthritis   . Hepatitis     stated in the past    Past Surgical History  Procedure Laterality Date  . Tubal ligation    . Orif calcaneous fracture Left 10/21/2014    Procedure: OPEN REDUCTION INTERNAL FIXATION (ORIF) LEFT CALCANEOUS FRACTURE;  Surgeon: Rozanna Box, MD;  Location: [facility];  Service: Orthopedics;  Laterality: Left;  Marland Kitchen Kyphoplasty N/A 02/17/2015    Procedure: T12 KYPHOPLASTY;  Surgeon: Melina Schools, MD;  Location: [facility];  Service: Orthopedics;  Laterality: N/A;    There were no vitals filed for this visit.  Visit Diagnosis:  Bilateral low back pain without sciatica  Difficulty walking  Abnormal posture      Subjective Assessment - 05/05/15 1331    Subjective My backs hurting.   ( she was observed bending into full lfexion picking up and placeing a scaled to  floor normal or bette speed and smoothness of motion without indication of pain)   Currently in Pain? Yes                         [facility] Adult PT Treatment/Exercise - 05/05/15 1340    Lumbar Exercises: Aerobic   Stationary Bike Nustep L4 LE only 6 min   Lumbar Exercises: Quadruped   [place] 10 reps   Cryotherapy   Number Minutes Cryotherapy 15 Minutes   Cryotherapy Location Back   Type of Cryotherapy Ice pack   Electrical Stimulation   Electrical Stimulation Location back   Electrical Stimulation Action IFC   Electrical Stimulation Parameters L8   Electrical Stimulation Goals Pain     Pilates prep x 15 reps 2-3 sec hold, side lye clams and abduction x 15 reps RT and LT.  Bridge x 20 with cues for pelvic tilt.   Knee to chest and rotational stretch x 30 sec each. RT and LT x 2. Prone hip extension x 15 Rt and Lt.  Arm lifts in quadraped x 12 RT and Lt             PT Short Term Goals - 04/25/15 1245    PT SHORT TERM GOAL #1   Title she will be able to perform all inital HEP   Time 4  Period Weeks   Status On-going   PT SHORT TERM GOAL #2   Title She will report pain decreased at least 25%  and she is able to stand for 15 min or more   Time 4   Period Weeks   Status On-going   PT SHORT TERM GOAL #3   Title she will report able to walk for 15-20 min before need to sit   Time 4   Period Weeks   Status On-going           PT Long Term Goals - 04/25/15 1246    PT LONG TERM GOAL #1   Title she will be able to perform all HEP issue as of last visit   Time 8   Period Weeks   Status On-going   PT LONG TERM GOAL #2   Title She will report pain reduced 50% or more with normal activity at home   Time 8   Period Weeks   Status On-going   PT LONG TERM GOAL #3   Title She will report able to stand and do home tasks for 30-4 min before need to sit   Time 8   Period Weeks   Status On-going   PT LONG TERM GOAL #4   Title She will walk for 30  min before she needs to stop for pain   Time 8   Period Weeks   Status On-going               Plan - 05/05/15 1714    Clinical Impression Statement No increaed pain post treatment and she did very well with exercises   Rehab Potential Fair   PT Next Visit Plan Start HEP   Consulted and Agree with Plan of Care Patient        Problem List Patient Active Problem List   Diagnosis Date Noted  . Compression fracture of body of thoracic vertebra 02/17/2015  . Lumbar compression fracture, L1-2 endplate  22/48/2500  . Thoracic compression fracture, T12 endplate  37/03/8888  . Fracture of metatarsal bone of right foot, 4th neck 10/22/2014  . Has jumped from building 10/22/2014  . Bipolar 1 disorder   . Schizoaffective disorder   . Calcaneal fracture, Left  10/19/2014    Darrel Hoover PT 05/05/2015, 5:16 PM  [facility] [address] [place], [place], [address] Phone: [phone]   Fax:  [phone]

## 2015-05-09 ENCOUNTER — Ambulatory Visit: Payer: Medicare Other

## 2015-05-12 ENCOUNTER — Ambulatory Visit: Payer: Medicare Other | Admitting: Physical Therapy

## 2015-05-18 ENCOUNTER — Ambulatory Visit: Payer: Medicare Other | Admitting: Physical Therapy

## 2015-05-18 DIAGNOSIS — M545 Low back pain, unspecified: Secondary | ICD-10-CM

## 2015-05-18 DIAGNOSIS — R293 Abnormal posture: Secondary | ICD-10-CM

## 2015-05-18 DIAGNOSIS — M2560 Stiffness of unspecified joint, not elsewhere classified: Secondary | ICD-10-CM | POA: Diagnosis not present

## 2015-05-18 DIAGNOSIS — R6889 Other general symptoms and signs: Secondary | ICD-10-CM

## 2015-05-18 DIAGNOSIS — R262 Difficulty in walking, not elsewhere classified: Secondary | ICD-10-CM | POA: Diagnosis not present

## 2015-05-18 NOTE — Patient Instructions (Signed)
   Copyright  VHI. All rights reserved.  Bracing With Arm Raise (Quadruped)   On hands and knees find neutral spine. Tighten pelvic floor and abdominals and hold. Alternately lift arm to shoulder level. Repeat _10__ times. Do __2_ times a day.   Copyright  VHI. All rights reserved.  Bracing With Leg Raise (Quadruped)   On hands and knees find neutral spine. Tighten pelvic floor and abdominals and hold. Alternating legs, straighten and lift to hip level. Repeat _10__ times. Do _2__ times a day.   Copyright  VHI. All rights reserved.  Bracing With Arm / Leg Raise (Quadruped)   On hands and knees find neutral spine. Tighten pelvic floor and abdominals and hold. Alternating, lift arm to shoulder level and opposite leg to hip level. Repeat 10___ times. Do __2_ times a day.    Copyright  VHI. All rights reserved.    Kneel on hands and knees. Tuck chin and tighten stomach. Exhale and round back upward. Inhale and arch back downward. Hold each position __10_ seconds. Repeat _10__ times per session. Do _2__ sessions per day.  Copyright  VHI. All rights reserved.   Copyright  VHI. All rights reserved.  Angry Cat, All Fours Strengthening: Wall Slide   Leaning on wall, Tighten abdominal and pelvic floor and slowly lower buttocks while bending knees. Hold __3__ seconds. Return. Repeat __10__ times per set. Do _1-2___ sets per session. Do __2__ sessions per day.  http://orth.exer.us/631
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
## 2015-05-19 ENCOUNTER — Ambulatory Visit: Payer: Medicare Other | Admitting: Physical Therapy

## 2015-05-19 NOTE — Addendum Note (Signed)
Addended by: Darrel Hoover on: 05/19/2015 01:57 PM   Modules accepted: Orders

## 2015-05-25 DIAGNOSIS — F319 Bipolar disorder, unspecified: Secondary | ICD-10-CM | POA: Diagnosis not present

## 2015-05-30 ENCOUNTER — Other Ambulatory Visit: Payer: Self-pay | Admitting: Orthopedic Surgery

## 2015-05-30 DIAGNOSIS — M19071 Primary osteoarthritis, right ankle and foot: Secondary | ICD-10-CM | POA: Diagnosis not present

## 2015-05-30 DIAGNOSIS — M7752 Other enthesopathy of left foot: Secondary | ICD-10-CM | POA: Diagnosis not present

## 2015-05-30 DIAGNOSIS — S92341D Displaced fracture of fourth metatarsal bone, right foot, subsequent encounter for fracture with routine healing: Secondary | ICD-10-CM | POA: Diagnosis not present

## 2015-05-30 DIAGNOSIS — S92002D Unspecified fracture of left calcaneus, subsequent encounter for fracture with routine healing: Secondary | ICD-10-CM | POA: Diagnosis not present

## 2015-05-30 DIAGNOSIS — M2550 Pain in unspecified joint: Secondary | ICD-10-CM

## 2015-06-03 ENCOUNTER — Ambulatory Visit
Admission: RE | Admit: 2015-06-03 | Discharge: 2015-06-03 | Disposition: A | Discharge status: Home / Self Care | Payer: Medicare Other | Source: Ambulatory Visit | Attending: Orthopedic Surgery | Admitting: Orthopedic Surgery

## 2015-06-03 DIAGNOSIS — M2550 Pain in unspecified joint: Secondary | ICD-10-CM

## 2015-06-03 DIAGNOSIS — M19071 Primary osteoarthritis, right ankle and foot: Secondary | ICD-10-CM | POA: Diagnosis not present

## 2015-06-03 DIAGNOSIS — Z87828 Personal history of other (healed) physical injury and trauma: Secondary | ICD-10-CM | POA: Diagnosis not present

## 2015-06-14 ENCOUNTER — Other Ambulatory Visit: Payer: Self-pay | Admitting: Orthopedic Surgery

## 2015-06-14 DIAGNOSIS — M1990 Unspecified osteoarthritis, unspecified site: Secondary | ICD-10-CM

## 2015-06-23 ENCOUNTER — Ambulatory Visit
Admission: RE | Admit: 2015-06-23 | Discharge: 2015-06-23 | Disposition: A | Discharge status: Home / Self Care | Payer: Medicare Other | Source: Ambulatory Visit | Attending: Orthopedic Surgery | Admitting: Orthopedic Surgery

## 2015-06-23 DIAGNOSIS — M19171 Post-traumatic osteoarthritis, right ankle and foot: Secondary | ICD-10-CM | POA: Diagnosis not present

## 2015-06-23 DIAGNOSIS — M1990 Unspecified osteoarthritis, unspecified site: Secondary | ICD-10-CM

## 2015-06-23 MED ORDER — IOHEXOL 180 MG/ML  SOLN
1.0000 mL | Freq: Once | INTRAMUSCULAR | Status: AC | PRN
  Administered 2015-06-23: 1 mL via INTRA_ARTICULAR

## 2015-06-23 MED ORDER — TRIAMCINOLONE ACETONIDE 40 MG/ML IJ SUSP (RADIOLOGY)
40.0000 mg | Freq: Once | INTRAMUSCULAR | Status: AC
  Administered 2015-06-23: 40 mg via INTRA_ARTICULAR

## 2015-07-25 DIAGNOSIS — T8484XA Pain due to internal orthopedic prosthetic devices, implants and grafts, initial encounter: Secondary | ICD-10-CM | POA: Diagnosis not present

## 2015-07-25 DIAGNOSIS — M19071 Primary osteoarthritis, right ankle and foot: Secondary | ICD-10-CM | POA: Diagnosis not present

## 2015-07-28 ENCOUNTER — Encounter (HOSPITAL_COMMUNITY): Payer: Self-pay | Admitting: *Deleted

## 2015-07-29 ENCOUNTER — Ambulatory Visit (HOSPITAL_COMMUNITY)
Admission: RE | Admit: 2015-07-29 | Discharge: 2015-07-29 | Disposition: A | Discharge status: Home / Self Care | Payer: Medicare Other | Source: Ambulatory Visit | Attending: Orthopedic Surgery | Admitting: Orthopedic Surgery

## 2015-07-29 ENCOUNTER — Encounter (HOSPITAL_COMMUNITY): Payer: Self-pay | Admitting: *Deleted

## 2015-07-29 ENCOUNTER — Ambulatory Visit (HOSPITAL_COMMUNITY): Payer: Medicare Other

## 2015-07-29 ENCOUNTER — Ambulatory Visit (HOSPITAL_COMMUNITY): Payer: Medicare Other | Admitting: Anesthesiology

## 2015-07-29 ENCOUNTER — Encounter (HOSPITAL_COMMUNITY)
Admission: RE | Disposition: A | Discharge status: Home / Self Care | Payer: Self-pay | Source: Ambulatory Visit | Attending: Orthopedic Surgery

## 2015-07-29 DIAGNOSIS — M79671 Pain in right foot: Secondary | ICD-10-CM | POA: Diagnosis not present

## 2015-07-29 DIAGNOSIS — Z048 Encounter for examination and observation for other specified reasons: Secondary | ICD-10-CM | POA: Diagnosis not present

## 2015-07-29 DIAGNOSIS — M12571 Traumatic arthropathy, right ankle and foot: Secondary | ICD-10-CM | POA: Diagnosis not present

## 2015-07-29 DIAGNOSIS — M79672 Pain in left foot: Secondary | ICD-10-CM | POA: Insufficient documentation

## 2015-07-29 DIAGNOSIS — M1990 Unspecified osteoarthritis, unspecified site: Secondary | ICD-10-CM | POA: Diagnosis not present

## 2015-07-29 DIAGNOSIS — T8484XA Pain due to internal orthopedic prosthetic devices, implants and grafts, initial encounter: Secondary | ICD-10-CM | POA: Insufficient documentation

## 2015-07-29 DIAGNOSIS — Z87891 Personal history of nicotine dependence: Secondary | ICD-10-CM | POA: Diagnosis not present

## 2015-07-29 DIAGNOSIS — Z419 Encounter for procedure for purposes other than remedying health state, unspecified: Secondary | ICD-10-CM

## 2015-07-29 DIAGNOSIS — K219 Gastro-esophageal reflux disease without esophagitis: Secondary | ICD-10-CM | POA: Diagnosis not present

## 2015-07-29 HISTORY — PX: HARDWARE REMOVAL: SHX979

## 2015-07-29 HISTORY — PX: STERIOD INJECTION: SHX5046

## 2015-07-29 LAB — COMPREHENSIVE METABOLIC PANEL
ALT: 154 U/L — AB (ref 14–54)
ANION GAP: 8 (ref 5–15)
AST: 109 U/L — AB (ref 15–41)
Albumin: 3.4 g/dL — ABNORMAL LOW (ref 3.5–5.0)
Alkaline Phosphatase: 68 U/L (ref 38–126)
BUN: 8 mg/dL (ref 6–20)
CHLORIDE: 106 mmol/L (ref 101–111)
CO2: 24 mmol/L (ref 22–32)
Calcium: 8.8 mg/dL — ABNORMAL LOW (ref 8.9–10.3)
Creatinine, Ser: 1.04 mg/dL — ABNORMAL HIGH (ref 0.44–1.00)
GFR calc Af Amer: >60 mL/min (ref >60)
GFR calc non Af Amer: >60 mL/min (ref >60)
Glucose, Bld: 81 mg/dL (ref 65–99)
POTASSIUM: 4 mmol/L (ref 3.5–5.1)
Sodium: 138 mmol/L (ref 135–145)
Total Bilirubin: 0.8 mg/dL (ref 0.3–1.2)
Total Protein: 6.3 g/dL — ABNORMAL LOW (ref 6.5–8.1)

## 2015-07-29 LAB — CBC
HCT: 42.4 % (ref 36.0–46.0)
Hemoglobin: 14 g/dL (ref 12.0–15.0)
MCH: 30.7 pg (ref 26.0–34.0)
MCHC: 33 g/dL (ref 30.0–36.0)
MCV: 93 fL (ref 78.0–100.0)
Platelets: 274 10*3/uL (ref 150–400)
RBC: 4.56 MIL/uL (ref 3.87–5.11)
RDW: 14 % (ref 11.5–15.5)
WBC: 6.7 10*3/uL (ref 4.0–10.5)

## 2015-07-29 LAB — HCG, SERUM, QUALITATIVE: PREG SERUM: NEGATIVE

## 2015-07-29 SURGERY — REMOVAL, HARDWARE
Anesthesia: General | Site: Foot | Laterality: Right

## 2015-07-29 MED ORDER — ONDANSETRON HCL 4 MG/2ML IJ SOLN
INTRAMUSCULAR | Status: DC | PRN
  Administered 2015-07-29: 4 mg via INTRAVENOUS

## 2015-07-29 MED ORDER — OXYCODONE-ACETAMINOPHEN 5-325 MG PO TABS
ORAL_TABLET | ORAL | Status: AC
  Filled 2015-07-29: qty 2

## 2015-07-29 MED ORDER — SUCCINYLCHOLINE CHLORIDE 20 MG/ML IJ SOLN
INTRAMUSCULAR | Status: AC
  Filled 2015-07-29: qty 1

## 2015-07-29 MED ORDER — DEXAMETHASONE SODIUM PHOSPHATE 4 MG/ML IJ SOLN
INTRAMUSCULAR | Status: AC
  Filled 2015-07-29: qty 2

## 2015-07-29 MED ORDER — ONDANSETRON HCL 4 MG/2ML IJ SOLN
INTRAMUSCULAR | Status: AC
  Filled 2015-07-29: qty 2

## 2015-07-29 MED ORDER — SUCCINYLCHOLINE CHLORIDE 20 MG/ML IJ SOLN
INTRAMUSCULAR | Status: DC | PRN
  Administered 2015-07-29: 100 mg via INTRAVENOUS

## 2015-07-29 MED ORDER — CEFAZOLIN SODIUM-DEXTROSE 2-3 GM-% IV SOLR
2.0000 g | INTRAVENOUS | Status: AC
  Administered 2015-07-29: 2 g via INTRAVENOUS

## 2015-07-29 MED ORDER — HYDROMORPHONE HCL 1 MG/ML IJ SOLN
INTRAMUSCULAR | Status: AC
  Administered 2015-07-29: 0.5 mg via INTRAVENOUS
  Filled 2015-07-29: qty 1

## 2015-07-29 MED ORDER — LIDOCAINE HCL (CARDIAC) 20 MG/ML IV SOLN
INTRAVENOUS | Status: DC | PRN
  Administered 2015-07-29: 60 mg via INTRAVENOUS

## 2015-07-29 MED ORDER — OXYCODONE-ACETAMINOPHEN 5-325 MG PO TABS
1.0000 | ORAL_TABLET | Freq: Once | ORAL | Status: AC
  Administered 2015-07-29: 2 via ORAL

## 2015-07-29 MED ORDER — FENTANYL CITRATE (PF) 250 MCG/5ML IJ SOLN
INTRAMUSCULAR | Status: AC
  Filled 2015-07-29: qty 5

## 2015-07-29 MED ORDER — FENTANYL CITRATE (PF) 100 MCG/2ML IJ SOLN
INTRAMUSCULAR | Status: DC | PRN
  Administered 2015-07-29: 50 ug via INTRAVENOUS

## 2015-07-29 MED ORDER — LIDOCAINE HCL (CARDIAC) 20 MG/ML IV SOLN
INTRAVENOUS | Status: AC
  Filled 2015-07-29: qty 5

## 2015-07-29 MED ORDER — PROPOFOL 10 MG/ML IV BOLUS
INTRAVENOUS | Status: AC
  Filled 2015-07-29: qty 20

## 2015-07-29 MED ORDER — MIDAZOLAM HCL 5 MG/5ML IJ SOLN
INTRAMUSCULAR | Status: DC | PRN
  Administered 2015-07-29: 1 mg via INTRAVENOUS

## 2015-07-29 MED ORDER — LACTATED RINGERS IV SOLN
INTRAVENOUS | Status: DC
  Administered 2015-07-29 (×2): via INTRAVENOUS

## 2015-07-29 MED ORDER — HYDROMORPHONE HCL 1 MG/ML IJ SOLN
0.2500 mg | INTRAMUSCULAR | Status: DC | PRN
  Administered 2015-07-29 (×4): 0.5 mg via INTRAVENOUS

## 2015-07-29 MED ORDER — CHLORHEXIDINE GLUCONATE 4 % EX LIQD: 60.0000 mL | Freq: Once | CUTANEOUS | Status: DC

## 2015-07-29 MED ORDER — CEFAZOLIN SODIUM-DEXTROSE 2-3 GM-% IV SOLR
INTRAVENOUS | Status: AC
  Filled 2015-07-29: qty 50

## 2015-07-29 MED ORDER — BUPIVACAINE HCL 0.5 % IJ SOLN
INTRAMUSCULAR | Status: DC | PRN
  Administered 2015-07-29: 2 mL via INTRA_ARTICULAR

## 2015-07-29 MED ORDER — METHYLPREDNISOLONE ACETATE 80 MG/ML IJ SUSP
INTRAMUSCULAR | Status: AC
  Filled 2015-07-29: qty 1

## 2015-07-29 MED ORDER — METHYLPREDNISOLONE ACETATE 80 MG/ML IJ SUSP
INTRAMUSCULAR | Status: DC | PRN
  Administered 2015-07-29: 80 mg via INTRA_ARTICULAR

## 2015-07-29 MED ORDER — PROPOFOL 10 MG/ML IV BOLUS
INTRAVENOUS | Status: DC | PRN
  Administered 2015-07-29: 150 mg via INTRAVENOUS

## 2015-07-29 MED ORDER — DEXTROSE 5 % IV SOLN
INTRAVENOUS | Status: DC | PRN
  Administered 2015-07-29: 11:00:00 via INTRAVENOUS

## 2015-07-29 MED ORDER — IOHEXOL 300 MG/ML  SOLN
INTRAMUSCULAR | Status: DC | PRN
  Administered 2015-07-29: 5 mL via INTRAVENOUS

## 2015-07-29 MED ORDER — PHENYLEPHRINE HCL 10 MG/ML IJ SOLN
INTRAMUSCULAR | Status: DC | PRN
  Administered 2015-07-29 (×2): 80 ug via INTRAVENOUS

## 2015-07-29 MED ORDER — DEXAMETHASONE SODIUM PHOSPHATE 4 MG/ML IJ SOLN
INTRAMUSCULAR | Status: DC | PRN
  Administered 2015-07-29: 8 mg via INTRAVENOUS

## 2015-07-29 MED ORDER — TRAMADOL HCL 50 MG PO TABS: 50.0000 mg | ORAL_TABLET | Freq: Two times a day (BID) | ORAL | Status: DC | PRN

## 2015-07-29 MED ORDER — MIDAZOLAM HCL 2 MG/2ML IJ SOLN
INTRAMUSCULAR | Status: AC
  Filled 2015-07-29: qty 4

## 2015-07-29 SURGICAL SUPPLY — 62 items
BANDAGE ELASTIC 4 VELCRO ST LF (GAUZE/BANDAGES/DRESSINGS) ×4 IMPLANT
BANDAGE ELASTIC 6 VELCRO ST LF (GAUZE/BANDAGES/DRESSINGS) ×4 IMPLANT
BANDAGE ESMARK 6X9 LF (GAUZE/BANDAGES/DRESSINGS) ×2 IMPLANT
BNDG COHESIVE 6X5 TAN STRL LF (GAUZE/BANDAGES/DRESSINGS) ×4 IMPLANT
BNDG ESMARK 6X9 LF (GAUZE/BANDAGES/DRESSINGS) ×4
BNDG GAUZE ELAST 4 BULKY (GAUZE/BANDAGES/DRESSINGS) ×4 IMPLANT
BRUSH SCRUB DISP (MISCELLANEOUS) ×8 IMPLANT
CLEANER TIP ELECTROSURG 2X2 (MISCELLANEOUS) ×4 IMPLANT
CLOSURE WOUND 1/2 X4 (GAUZE/BANDAGES/DRESSINGS)
COVER SURGICAL LIGHT HANDLE (MISCELLANEOUS) ×8 IMPLANT
CUFF TOURNIQUET SINGLE 18IN (TOURNIQUET CUFF) IMPLANT
CUFF TOURNIQUET SINGLE 24IN (TOURNIQUET CUFF) IMPLANT
CUFF TOURNIQUET SINGLE 34IN LL (TOURNIQUET CUFF) IMPLANT
DRAPE C-ARM 42X72 X-RAY (DRAPES) IMPLANT
DRAPE C-ARMOR (DRAPES) ×4 IMPLANT
DRAPE OEC MINIVIEW 54X84 (DRAPES) ×4 IMPLANT
DRAPE U-SHAPE 47X51 STRL (DRAPES) ×4 IMPLANT
DRSG ADAPTIC 3X8 NADH LF (GAUZE/BANDAGES/DRESSINGS) ×4 IMPLANT
DRSG EMULSION OIL 3X3 NADH (GAUZE/BANDAGES/DRESSINGS) ×4 IMPLANT
ELECT REM PT RETURN 9FT ADLT (ELECTROSURGICAL) ×4
ELECTRODE REM PT RTRN 9FT ADLT (ELECTROSURGICAL) ×2 IMPLANT
EVACUATOR 1/8 PVC DRAIN (DRAIN) IMPLANT
GAUZE SPONGE 4X4 12PLY STRL (GAUZE/BANDAGES/DRESSINGS) ×4 IMPLANT
GLOVE BIO SURGEON STRL SZ7.5 (GLOVE) ×4 IMPLANT
GLOVE BIO SURGEON STRL SZ8 (GLOVE) ×4 IMPLANT
GLOVE BIOGEL PI IND STRL 7.5 (GLOVE) ×2 IMPLANT
GLOVE BIOGEL PI IND STRL 8 (GLOVE) ×2 IMPLANT
GLOVE BIOGEL PI INDICATOR 7.5 (GLOVE) ×2
GLOVE BIOGEL PI INDICATOR 8 (GLOVE) ×2
GOWN STRL REUS W/ TWL LRG LVL3 (GOWN DISPOSABLE) ×4 IMPLANT
GOWN STRL REUS W/ TWL XL LVL3 (GOWN DISPOSABLE) ×2 IMPLANT
GOWN STRL REUS W/TWL LRG LVL3 (GOWN DISPOSABLE) ×4
GOWN STRL REUS W/TWL XL LVL3 (GOWN DISPOSABLE) ×2
KIT BASIN OR (CUSTOM PROCEDURE TRAY) ×4 IMPLANT
KIT ROOM TURNOVER OR (KITS) ×4 IMPLANT
MANIFOLD NEPTUNE II (INSTRUMENTS) ×4 IMPLANT
NEEDLE 22X1 1/2 (OR ONLY) (NEEDLE) IMPLANT
NS IRRIG 1000ML POUR BTL (IV SOLUTION) ×4 IMPLANT
PACK ORTHO EXTREMITY (CUSTOM PROCEDURE TRAY) ×4 IMPLANT
PAD ARMBOARD 7.5X6 YLW CONV (MISCELLANEOUS) ×8 IMPLANT
PADDING CAST COTTON 6X4 STRL (CAST SUPPLIES) ×12 IMPLANT
SPONGE GAUZE 4X4 12PLY STER LF (GAUZE/BANDAGES/DRESSINGS) ×4 IMPLANT
SPONGE LAP 18X18 X RAY DECT (DISPOSABLE) ×4 IMPLANT
SPONGE SCRUB IODOPHOR (GAUZE/BANDAGES/DRESSINGS) ×4 IMPLANT
STAPLER VISISTAT 35W (STAPLE) IMPLANT
STOCKINETTE IMPERVIOUS LG (DRAPES) ×4 IMPLANT
STRIP CLOSURE SKIN 1/2X4 (GAUZE/BANDAGES/DRESSINGS) IMPLANT
SUCTION FRAZIER TIP 10 FR DISP (SUCTIONS) IMPLANT
SUT ETHILON 3 0 PS 1 (SUTURE) IMPLANT
SUT PDS AB 2-0 CT1 27 (SUTURE) IMPLANT
SUT VIC AB 0 CT1 27 (SUTURE)
SUT VIC AB 0 CT1 27XBRD ANBCTR (SUTURE) IMPLANT
SUT VIC AB 2-0 CT1 27 (SUTURE)
SUT VIC AB 2-0 CT1 TAPERPNT 27 (SUTURE) IMPLANT
SYR CONTROL 10ML LL (SYRINGE) IMPLANT
TOWEL OR 17X24 6PK STRL BLUE (TOWEL DISPOSABLE) ×8 IMPLANT
TOWEL OR 17X26 10 PK STRL BLUE (TOWEL DISPOSABLE) ×8 IMPLANT
TUBE CONNECTING 12'X1/4 (SUCTIONS) ×1
TUBE CONNECTING 12X1/4 (SUCTIONS) ×3 IMPLANT
UNDERPAD 30X30 INCONTINENT (UNDERPADS AND DIAPERS) ×4 IMPLANT
WATER STERILE IRR 1000ML POUR (IV SOLUTION) ×8 IMPLANT
YANKAUER SUCT BULB TIP NO VENT (SUCTIONS) ×4 IMPLANT

## 2015-07-29 NOTE — Progress Notes (Signed)
Patient reported that she had a "very small sip of Pepsi" this am and realized she wasn't supposed to have anything to drink and stopped. Anesthesiologist notified and aware.

## 2015-07-29 NOTE — Discharge Instructions (Signed)
Orthopaedic Trauma Service Discharge Instructions   General Discharge Instructions  WEIGHT BEARING STATUS: Weightbearing as tolerated Bilateral legs  RANGE OF MOTION/ACTIVITY: no restrictions  PAIN MEDICATION USE AND EXPECTATIONS  You have likely been given narcotic medications to help control your pain.  After a traumatic event that results in an fracture (broken bone) with or without surgery, it is ok to use narcotic pain medications to help control one's pain.  We understand that everyone responds to pain differently and each individual patient will be evaluated on a regular basis for the continued need for narcotic medications. Ideally, narcotic medication use should last no more than 6-8 weeks (coinciding with fracture healing).   As a patient it is your responsibility as well to monitor narcotic medication use and report the amount and frequency you use these medications when you come to your office visit.   We would also advise that if you are using narcotic medications, you should take a dose prior to therapy to maximize you participation.  IF YOU ARE ON NARCOTIC MEDICATIONS IT IS NOT PERMISSIBLE TO OPERATE A MOTOR VEHICLE (MOTORCYCLE/CAR/TRUCK/MOPED) OR HEAVY MACHINERY DO NOT MIX NARCOTICS WITH OTHER CNS (CENTRAL NERVOUS SYSTEM) DEPRESSANTS SUCH AS ALCOHOL  Diet: as you were eating previously.  Can use over the counter stool softeners and bowel preparations, such as Miralax, to help with bowel movements.  Narcotics can be constipating.  Be sure to drink plenty of fluids  Wound Care:daily dressing changes starting 08/01/2015  STOP SMOKING OR USING NICOTINE PRODUCTS!!!!  As discussed nicotine severely impairs your body's ability to heal surgical and traumatic wounds but also impairs bone healing.  Wounds and bone heal by forming microscopic blood vessels (angiogenesis) and nicotine is a vasoconstrictor (essentially, shrinks blood vessels).  Therefore, if vasoconstriction occurs to these  microscopic blood vessels they essentially disappear and are unable to deliver necessary nutrients to the healing tissue.  This is one modifiable factor that you can do to dramatically increase your chances of healing your injury.    (This means no smoking, no nicotine gum, patches, etc)  DO NOT USE NONSTEROIDAL ANTI-INFLAMMATORY DRUGS (NSAID'S)  Using products such as Advil (ibuprofen), Aleve (naproxen), Motrin (ibuprofen) for additional pain control during fracture healing can delay and/or prevent the healing response.  If you would like to take over the counter (OTC) medication, Tylenol (acetaminophen) is ok.  However, some narcotic medications that are given for pain control contain acetaminophen as well. Therefore, you should not exceed more than 4000 mg of tylenol in a day if you do not have liver disease.  Also note that there are may OTC medicines, such as cold medicines and allergy medicines that my contain tylenol as well.  If you have any questions about medications and/or interactions please ask your doctor/PA or your pharmacist.      ICE AND ELEVATE INJURED/OPERATIVE EXTREMITY  Using ice and elevating the injured extremity above your heart can help with swelling and pain control.  Icing in a pulsatile fashion, such as 20 minutes on and 20 minutes off, can be followed.    Do not place ice directly on skin. Make sure there is a barrier between to skin and the ice pack.    Using frozen items such as frozen peas works well as the conform nicely to the are that needs to be iced.  USE AN ACE WRAP OR TED HOSE FOR SWELLING CONTROL  In addition to icing and elevation, Ace wraps or TED hose are used to help  limit and resolve swelling.  It is recommended to use Ace wraps or TED hose until you are informed to stop.    When using Ace Wraps start the wrapping distally (farthest away from the body) and wrap proximally (closer to the body)   Example: If you had surgery on your leg or thing and you do not  have a splint on, start the ace wrap at the toes and work your way up to the thigh        If you had surgery on your upper extremity and do not have a splint on, start the ace wrap at your fingers and work your way up to the upper arm  IF YOU ARE IN A SPLINT OR CAST DO NOT [place]   If your splint gets wet for any reason please contact the office immediately. You may shower in your splint or cast as long as you keep it dry.  This can be done by wrapping in a cast cover or garbage back (or similar)  Do Not stick any thing down your splint or cast such as pencils, money, or hangers to try and scratch yourself with.  If you feel itchy take benadryl as prescribed on the bottle for itching  IF YOU ARE IN A CAM BOOT (BLACK BOOT)  You may remove boot periodically. Perform daily dressing changes as noted below.  Wash the liner of the boot regularly and wear a sock when wearing the boot. It is recommended that you sleep in the boot until told otherwise  CALL THE OFFICE WITH ANY QUESTIONS OR CONCERTS: [phone]     Discharge Pin Site Instructions  Dress pins daily with Kerlix roll starting on POD 2. Wrap the Kerlix so that it tamps the skin down around the pin-skin interface to prevent/limit motion of the skin relative to the pin.  (Pin-skin motion is the primary cause of pain and infection related to external fixator pin sites).  Remove any crust or coagulum that may obstruct drainage with a saline moistened gauze or soap and water.  After POD 3, if there is no discernable drainage on the pin site dressing, the interval for change can by increased to every other day.  You may shower with the fixator, cleaning all pin sites gently with soap and water.  If you have a surgical wound this needs to be completely dry and without drainage before showering.  The extremity can be lifted by the fixator to facilitate wound care and transfers.  Notify the office/Doctor if you experience  increasing drainage, redness, or pain from a pin site, or if you notice purulent (thick, snot-like) drainage.  Discharge Wound Care Instructions  Do NOT apply any ointments, solutions or lotions to pin sites or surgical wounds.  These prevent needed drainage and even though solutions like hydrogen peroxide kill bacteria, they also damage cells lining the pin sites that help fight infection.  Applying lotions or ointments can keep the wounds moist and can cause them to breakdown and open up as well. This can increase the risk for infection. When in doubt call the office.  Surgical incisions should be dressed daily.  If any drainage is noted, use one layer of adaptic, then gauze, Kerlix, and an ace wrap.  Once the incision is completely dry and without drainage, it may be left open to air out.  Showering may begin 36-48 hours later.  Cleaning gently with soap and water.  Traumatic wounds should be dressed  daily as well.    One layer of adaptic, gauze, Kerlix, then ace wrap.  The adaptic can be discontinued once the draining has ceased    If you have a wet to dry dressing: wet the gauze with saline the squeeze as much saline out so the gauze is moist (not soaking wet), place moistened gauze over wound, then place a dry gauze over the moist one, followed by Kerlix wrap, then ace wrap.

## 2015-07-29 NOTE — Transfer of Care (Signed)
Immediate Anesthesia Transfer of Care Note  Patient: Lauren Fuentes  Procedure(s) Performed: Procedure(s): HARDWARE REMOVAL,LEFT HEEL (Left) Subtalar Joint injection with Fluoro guidance  (Right)  Patient Location: PACU  Anesthesia Type:General  Level of Consciousness: awake, alert , oriented and patient cooperative  Airway & Oxygen Therapy: Patient Spontanous Breathing and Patient connected to nasal cannula oxygen  Post-op Assessment: Report given to RN, Post -op Vital signs reviewed and stable and Patient moving all extremities  Post vital signs: Reviewed and stable  Last Vitals:  Filed Vitals:   07/29/15 1140  BP:   Pulse:   Temp: 36.4 C  Resp:     Complications: No apparent anesthesia complications

## 2015-07-29 NOTE — Brief Op Note (Signed)
07/29/2015  10:19 AM  PATIENT:  Lauren Fuentes  49 y.o. female  PRE-OPERATIVE DIAGNOSIS:   1. SYMPTOMATIC HARDWARE LEFT CALCANEUS 2. SUBTALAR ARTHRISTIS RIGHT FOOT  POST-OPERATIVE DIAGNOSIS:   1. SYMPTOMATIC HARDWARE LEFT CALCANEUS 2. SUBTALAR ARTHRISTIS RIGHT FOOT  PROCEDURE:  Procedure(s): 1. HARDWARE REMOVAL, LEFT HEEL 2. SUBTALAR JOINT INJECTION WITH FLOURO GUIDANCE, RIGHT  SURGEON:  Surgeon(s) and Role:    * Altamese [place], MD - Primary  PHYSICIAN ASSISTANT: None  ANESTHESIA:   general  I/O:     SPECIMEN:  No Specimen  TOURNIQUET:    DICTATION: .Other Dictation: Dictation Number [id number]

## 2015-07-29 NOTE — H&P (Signed)
Orthopaedic Trauma Service     Chief Complaint:  L heel pain and R foot pain  HPI:   49 y/o female known to OTS for L calcaneus fracture in 09/2014. Required operative intervention.  Pt has healed her fracture but is having persistent pain. She has failed conservative measures and wishes to have HW removed.  She also has subtalar pain in R foot   Past Medical History  Diagnosis Date  . Bipolar 1 disorder   . Schizoaffective disorder   . Post traumatic stress disorder (PTSD)   . Calcaneal fracture, Left  10/19/2014  . Thoracic compression fracture, T12 endplate  35/00/9381  . Lumbar compression fracture, L1-2 endplate  82/99/3716  . Fracture of metatarsal bone of right foot, 4th neck 10/22/2014  . Back pain, chronic   . GERD (gastroesophageal reflux disease)   . Arthritis   . Headache     hx migraines, tension headaches  . Hepatitis     stated in the past - pt denies this as of 07/28/15)    Past Surgical History  Procedure Laterality Date  . Tubal ligation    . Orif calcaneous fracture Left 10/21/2014    Procedure: OPEN REDUCTION INTERNAL FIXATION (ORIF) LEFT CALCANEOUS FRACTURE;  Surgeon: Rozanna Box, MD;  Location: [facility];  Service: Orthopedics;  Laterality: Left;  Marland Kitchen Kyphoplasty N/A 02/17/2015    Procedure: T12 KYPHOPLASTY;  Surgeon: Melina Schools, MD;  Location: [facility];  Service: Orthopedics;  Laterality: N/A;  . Colonoscopy      Family History  Problem Relation Age of Onset  . Hypertension Mother   . Bipolar disorder Mother   . Hypertension Father   . Cancer Father    Social History:  reports that she quit smoking about 3 months ago. Her smoking use included Cigarettes. She has a 4.5 pack-year smoking history. She has never used smokeless tobacco. She reports that she drinks alcohol. She reports that she uses illicit drugs (Cocaine).  Allergies: No Known Allergies  Medications Prior to Admission  Medication Sig Dispense Refill  . Biotin 1 MG CAPS Take 1 mg by mouth  daily.    . busPIRone (BUSPAR) 5 MG tablet Take 5 mg by mouth 3 (three) times daily.    . Cholecalciferol (VITAMIN D-3) 1000 UNITS CAPS Take 1,000 Units by mouth daily.    Marland Kitchen FLUoxetine (PROZAC) 40 MG capsule Take 40 mg by mouth daily.    . fluticasone (FLONASE) 50 MCG/ACT nasal spray Place 2 sprays into the nose daily.    Marland Kitchen ibuprofen (ADVIL,MOTRIN) 800 MG tablet Take 800 mg by mouth every 8 (eight) hours as needed for headache or moderate pain.    Marland Kitchen lamoTRIgine (LAMICTAL) 100 MG tablet Take 100 mg by mouth 2 (two) times daily.    Marland Kitchen loxapine (LOXITANE) 10 MG capsule Take 10 mg by mouth 3 (three) times daily.     . methocarbamol (ROBAXIN) 500 MG tablet Take 1 tablet (500 mg total) by mouth 3 (three) times daily as needed for muscle spasms. 60 tablet 0  . traZODone (DESYREL) 150 MG tablet Take 300 mg by mouth at bedtime.    . valACYclovir (VALTREX) 500 MG tablet Take 500 mg by mouth 2 (two) times daily.    Marland Kitchen aspirin EC 325 MG tablet Take 1 tablet (325 mg total) by mouth 2 (two) times daily. (Patient not taking: Reported on 04/13/2015) 60 tablet 0  . hydrOXYzine (ATARAX/VISTARIL) 25 MG tablet Take 1 tablet (25 mg total) by mouth every  6 (six) hours as needed for itching. (Patient not taking: Reported on 04/13/2015) 28 tablet 0  . ondansetron (ZOFRAN) 4 MG tablet Take 1 tablet (4 mg total) by mouth every 8 (eight) hours as needed for nausea or vomiting. (Patient not taking: Reported on 04/13/2015) 20 tablet 0  . oxyCODONE-acetaminophen (PERCOCET) 10-325 MG per tablet Take 1 tablet by mouth every 4 (four) hours as needed for pain. (Patient not taking: Reported on 07/26/2015) 60 tablet 0    No results found for this or any previous visit (from the past 48 hour(s)). No results found.  Review of Systems  Constitutional: Negative for fever and chills.  Respiratory: Negative for shortness of breath and wheezing.   Cardiovascular: Negative for chest pain and palpitations.  Gastrointestinal: Negative for  nausea and vomiting.  Genitourinary: Negative for dysuria.  Musculoskeletal:       L heel pain R foot pain   Neurological: Negative for tingling and headaches.    Last menstrual period 06/21/2015. Physical Exam  Constitutional: She appears well-developed and well-nourished. No distress.  HENT:  Head: Normocephalic and atraumatic.  Cardiovascular: Normal rate and regular rhythm.   Respiratory: Effort normal and breath sounds normal. She has no wheezes.  GI: Soft. Bowel sounds are normal.  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

## 2015-07-29 NOTE — Anesthesia Procedure Notes (Signed)
Procedure Name: Intubation Date/Time: 07/29/2015 10:31 AM Performed by: Terrill Mohr Pre-anesthesia Checklist: Emergency Drugs available, Patient identified, Suction available and Patient being monitored Patient Re-evaluated:Patient Re-evaluated prior to inductionOxygen Delivery Method: Circle system utilized Preoxygenation: Pre-oxygenation with 100% oxygen Intubation Type: IV induction Ventilation: Mask ventilation without difficulty Laryngoscope Size: Mac and 3 Grade View: Grade II Tube type: Oral Tube size: 7.5 mm Number of attempts: 1 Airway Equipment and Method: Stylet Placement Confirmation: ETT inserted through vocal cords under direct vision,  positive ETCO2 and breath sounds checked- equal and bilateral Secured at: 21 (cm at teeth) cm Tube secured with: Tape Dental Injury: Teeth and Oropharynx as per pre-operative assessment

## 2015-07-29 NOTE — Anesthesia Postprocedure Evaluation (Signed)
  Anesthesia Post-op Note  Patient: Lauren Fuentes  Procedure(s) Performed: Procedure(s): HARDWARE REMOVAL,LEFT HEEL (Left) Subtalar Joint injection with Fluoro guidance  (Right)  Patient Location: PACU  Anesthesia Type:General  Level of Consciousness: awake and alert   Airway and Oxygen Therapy: Patient Spontanous Breathing  Post-op Pain: Controlled  Post-op Assessment: Post-op Vital signs reviewed, Patient's Cardiovascular Status Stable and Respiratory Function Stable  Post-op Vital Signs: Reviewed  Filed Vitals:   07/29/15 1225  BP: 134/69  Pulse: 58  Temp:   Resp: 18    Complications: No apparent anesthesia complications

## 2015-07-29 NOTE — Anesthesia Preprocedure Evaluation (Addendum)
Anesthesia Evaluation  Patient identified by MRN, date of birth, ID band Patient awake    Reviewed: Allergy & Precautions, H&P , NPO status , Patient's Chart, lab work & pertinent test results  Airway Mallampati: II  TM Distance: >3 FB Neck ROM: Full    Dental no notable dental hx. (+) Teeth Intact, Dental Advisory Given   Pulmonary neg pulmonary ROS, former smoker,  breath sounds clear to auscultation  Pulmonary exam normal       Cardiovascular negative cardio ROS  Rhythm:Regular Rate:Normal     Neuro/Psych  Headaches, Bipolar Disorder Schizophrenia    GI/Hepatic GERD-  ,(+) Hepatitis -  Endo/Other  negative endocrine ROS  Renal/GU negative Renal ROS  negative genitourinary   Musculoskeletal  (+) Arthritis -, Osteoarthritis,    Abdominal   Peds  Hematology negative hematology ROS (+)   Anesthesia Other Findings   Reproductive/Obstetrics negative OB ROS                            Anesthesia Physical Anesthesia Plan  ASA: III  Anesthesia Plan: General   Post-op Pain Management:    Induction: Intravenous  Airway Management Planned: Oral ETT  Additional Equipment:   Intra-op Plan:   Post-operative Plan: Extubation in OR  Informed Consent: I have reviewed the patients History and Physical, chart, labs and discussed the procedure including the risks, benefits and alternatives for the proposed anesthesia with the patient or authorized representative who has indicated his/her understanding and acceptance.   Dental advisory given  Plan Discussed with: CRNA  Anesthesia Plan Comments:         Anesthesia Quick Evaluation
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
## 2015-08-01 ENCOUNTER — Encounter (HOSPITAL_COMMUNITY): Payer: Self-pay | Admitting: Orthopedic Surgery

## 2015-08-22 DIAGNOSIS — T8484XA Pain due to internal orthopedic prosthetic devices, implants and grafts, initial encounter: Secondary | ICD-10-CM | POA: Diagnosis not present

## 2015-08-22 DIAGNOSIS — S92002D Unspecified fracture of left calcaneus, subsequent encounter for fracture with routine healing: Secondary | ICD-10-CM | POA: Diagnosis not present

## 2015-08-27 DIAGNOSIS — H04123 Dry eye syndrome of bilateral lacrimal glands: Secondary | ICD-10-CM | POA: Diagnosis not present

## 2015-08-27 DIAGNOSIS — H40033 Anatomical narrow angle, bilateral: Secondary | ICD-10-CM | POA: Diagnosis not present

## 2015-08-31 ENCOUNTER — Other Ambulatory Visit: Payer: Self-pay | Admitting: Orthopedic Surgery

## 2015-08-31 DIAGNOSIS — M19172 Post-traumatic osteoarthritis, left ankle and foot: Secondary | ICD-10-CM

## 2015-08-31 DIAGNOSIS — Z23 Encounter for immunization: Secondary | ICD-10-CM | POA: Diagnosis not present

## 2015-09-06 ENCOUNTER — Ambulatory Visit
Admission: RE | Admit: 2015-09-06 | Discharge: 2015-09-06 | Disposition: A | Discharge status: Home / Self Care | Payer: Medicare Other | Source: Ambulatory Visit | Attending: Orthopedic Surgery | Admitting: Orthopedic Surgery

## 2015-09-06 DIAGNOSIS — M25572 Pain in left ankle and joints of left foot: Secondary | ICD-10-CM | POA: Diagnosis not present

## 2015-09-06 DIAGNOSIS — M19172 Post-traumatic osteoarthritis, left ankle and foot: Secondary | ICD-10-CM

## 2015-09-06 MED ORDER — TRIAMCINOLONE ACETONIDE 40 MG/ML IJ SUSP (RADIOLOGY)
40.0000 mg | Freq: Once | INTRAMUSCULAR | Status: AC
  Administered 2015-09-06: 60 mg via INTRA_ARTICULAR

## 2015-09-06 MED ORDER — IOHEXOL 180 MG/ML  SOLN
1.0000 mL | Freq: Once | INTRAMUSCULAR | Status: DC | PRN
  Administered 2015-09-06: 1 mL via INTRA_ARTICULAR

## 2015-09-09 ENCOUNTER — Ambulatory Visit: Payer: Medicare Other | Admitting: Family Medicine

## 2015-09-14 DIAGNOSIS — F319 Bipolar disorder, unspecified: Secondary | ICD-10-CM | POA: Diagnosis not present

## 2015-10-02 DIAGNOSIS — N3001 Acute cystitis with hematuria: Secondary | ICD-10-CM | POA: Diagnosis not present

## 2015-10-02 DIAGNOSIS — R0602 Shortness of breath: Secondary | ICD-10-CM | POA: Diagnosis not present

## 2015-10-02 DIAGNOSIS — Z79899 Other long term (current) drug therapy: Secondary | ICD-10-CM | POA: Diagnosis not present

## 2015-10-02 DIAGNOSIS — F319 Bipolar disorder, unspecified: Secondary | ICD-10-CM | POA: Diagnosis not present

## 2015-10-02 DIAGNOSIS — R1084 Generalized abdominal pain: Secondary | ICD-10-CM | POA: Diagnosis not present

## 2015-10-02 DIAGNOSIS — R05 Cough: Secondary | ICD-10-CM | POA: Diagnosis not present

## 2015-10-02 DIAGNOSIS — N760 Acute vaginitis: Secondary | ICD-10-CM | POA: Diagnosis not present

## 2015-10-02 DIAGNOSIS — R0789 Other chest pain: Secondary | ICD-10-CM | POA: Diagnosis not present

## 2015-10-02 DIAGNOSIS — F17200 Nicotine dependence, unspecified, uncomplicated: Secondary | ICD-10-CM | POA: Diagnosis not present
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
## 2015-10-15 DIAGNOSIS — R079 Chest pain, unspecified: Secondary | ICD-10-CM | POA: Diagnosis not present

## 2015-10-15 DIAGNOSIS — Z049 Encounter for examination and observation for unspecified reason: Secondary | ICD-10-CM | POA: Diagnosis not present

## 2015-10-15 DIAGNOSIS — Z79899 Other long term (current) drug therapy: Secondary | ICD-10-CM | POA: Diagnosis not present

## 2015-10-15 DIAGNOSIS — M542 Cervicalgia: Secondary | ICD-10-CM | POA: Diagnosis not present

## 2015-10-15 DIAGNOSIS — Z7951 Long term (current) use of inhaled steroids: Secondary | ICD-10-CM | POA: Diagnosis not present

## 2015-10-15 DIAGNOSIS — F209 Schizophrenia, unspecified: Secondary | ICD-10-CM | POA: Diagnosis not present

## 2015-10-15 DIAGNOSIS — S161XXA Strain of muscle, fascia and tendon at neck level, initial encounter: Secondary | ICD-10-CM | POA: Diagnosis not present

## 2015-10-15 DIAGNOSIS — R51 Headache: Secondary | ICD-10-CM | POA: Diagnosis not present

## 2015-10-15 DIAGNOSIS — R0602 Shortness of breath: Secondary | ICD-10-CM | POA: Diagnosis not present

## 2015-10-15 DIAGNOSIS — M549 Dorsalgia, unspecified: Secondary | ICD-10-CM | POA: Diagnosis not present

## 2015-10-15 DIAGNOSIS — S9002XA Contusion of left ankle, initial encounter: Secondary | ICD-10-CM | POA: Diagnosis not present

## 2015-10-15 DIAGNOSIS — F319 Bipolar disorder, unspecified: Secondary | ICD-10-CM | POA: Diagnosis not present

## 2015-10-15 DIAGNOSIS — M25552 Pain in left hip: Secondary | ICD-10-CM | POA: Diagnosis not present

## 2015-10-15 DIAGNOSIS — S0093XA Contusion of unspecified part of head, initial encounter: Secondary | ICD-10-CM | POA: Diagnosis not present

## 2015-10-15 DIAGNOSIS — F17200 Nicotine dependence, unspecified, uncomplicated: Secondary | ICD-10-CM | POA: Diagnosis not present

## 2015-11-15 ENCOUNTER — Ambulatory Visit: Payer: Medicare Other | Admitting: Family Medicine

## 2015-11-16 ENCOUNTER — Encounter: Payer: Self-pay | Admitting: Family Medicine

## 2015-11-23 ENCOUNTER — Telehealth: Payer: Self-pay | Admitting: *Deleted

## 2015-12-02 ENCOUNTER — Encounter: Payer: Self-pay | Admitting: Family Medicine

## 2015-12-02 ENCOUNTER — Ambulatory Visit (INDEPENDENT_AMBULATORY_CARE_PROVIDER_SITE_OTHER): Payer: Medicare Other | Admitting: Family Medicine

## 2015-12-02 VITALS — BP 106/78 | HR 94 | Temp 97.6°F | Ht 63.5 in | Wt 178.4 lb

## 2015-12-02 DIAGNOSIS — M4806 Spinal stenosis, lumbar region: Secondary | ICD-10-CM | POA: Diagnosis not present

## 2015-12-02 DIAGNOSIS — R319 Hematuria, unspecified: Secondary | ICD-10-CM | POA: Diagnosis not present

## 2015-12-02 DIAGNOSIS — F3160 Bipolar disorder, current episode mixed, unspecified: Secondary | ICD-10-CM | POA: Diagnosis not present

## 2015-12-02 DIAGNOSIS — N951 Menopausal and female climacteric states: Secondary | ICD-10-CM | POA: Diagnosis not present

## 2015-12-02 DIAGNOSIS — E785 Hyperlipidemia, unspecified: Secondary | ICD-10-CM | POA: Diagnosis not present

## 2015-12-02 DIAGNOSIS — M542 Cervicalgia: Secondary | ICD-10-CM

## 2015-12-02 DIAGNOSIS — N390 Urinary tract infection, site not specified: Secondary | ICD-10-CM | POA: Diagnosis not present

## 2015-12-02 DIAGNOSIS — Z113 Encounter for screening for infections with a predominantly sexual mode of transmission: Secondary | ICD-10-CM | POA: Diagnosis not present

## 2015-12-02 DIAGNOSIS — M48061 Spinal stenosis, lumbar region without neurogenic claudication: Secondary | ICD-10-CM

## 2015-12-02 LAB — POCT URINALYSIS DIPSTICK
BILIRUBIN UA: NEGATIVE
Blood, UA: NEGATIVE
GLUCOSE UA: NEGATIVE
KETONES UA: NEGATIVE
Nitrite, UA: NEGATIVE
PROTEIN UA: NEGATIVE
SPEC GRAV UA: 1.01
Urobilinogen, UA: NEGATIVE
pH, UA: 5

## 2015-12-02 LAB — POCT WET PREP WITH KOH
Clue Cells Wet Prep HPF POC: NEGATIVE
RBC WET PREP PER HPF POC: NEGATIVE
Trichomonas, UA: POSITIVE
WBC WET PREP PER HPF POC: NEGATIVE

## 2015-12-02 LAB — POCT UA - MICROSCOPIC ONLY
Bacteria, U Microscopic: NEGATIVE
Casts, Ur, LPF, POC: NEGATIVE
Crystals, Ur, HPF, POC: NEGATIVE
Mucus, UA: NEGATIVE
RBC, urine, microscopic: NEGATIVE
Yeast, UA: NEGATIVE

## 2015-12-02 MED ORDER — METRONIDAZOLE 500 MG PO TABS: 2000.0000 mg | ORAL_TABLET | Freq: Once | ORAL | Status: DC

## 2015-12-02 MED ORDER — ESTRADIOL-NORETHINDRONE ACET 1-0.5 MG PO TABS: 1.0000 | ORAL_TABLET | Freq: Every day | ORAL | Status: DC

## 2015-12-02 MED ORDER — TIZANIDINE HCL 4 MG PO TABS
4.0000 mg | ORAL_TABLET | Freq: Four times a day (QID) | ORAL | Status: DC | PRN
Start: 2015-12-02 — End: 2016-01-13

## 2015-12-02 NOTE — Addendum Note (Signed)
Addended by: Selmer Dominion on: 12/02/2015 03:38 PM   Modules accepted: Orders

## 2015-12-02 NOTE — Addendum Note (Signed)
Addended by: Earlene Plater on: 12/02/2015 05:36 PM   Modules accepted: Orders
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
## 2015-12-02 NOTE — Addendum Note (Signed)
Addended by: Marin Olp on: 12/02/2015 05:55 PM   Modules accepted: Orders

## 2015-12-03 LAB — LIPID PANEL
Chol/HDL Ratio: 2.5 ratio units (ref 0.0–4.4)
Cholesterol, Total: 236 mg/dL — ABNORMAL HIGH (ref 100–199)
HDL: 93 mg/dL (ref >39)
LDL Calculated: 124 mg/dL — ABNORMAL HIGH (ref 0–99)
Triglycerides: 95 mg/dL (ref 0–149)
VLDL Cholesterol Cal: 19 mg/dL (ref 5–40)

## 2015-12-03 LAB — CMP14+EGFR
ALT: 16 IU/L (ref 0–32)
AST: 21 IU/L (ref 0–40)
Albumin/Globulin Ratio: 1.4 (ref 1.1–2.5)
Albumin: 4.2 g/dL (ref 3.5–5.5)
Alkaline Phosphatase: 71 IU/L (ref 39–117)
BUN/Creatinine Ratio: 18 (ref 9–23)
BUN: 19 mg/dL (ref 6–24)
Bilirubin Total: 0.2 mg/dL (ref 0.0–1.2)
CO2: 25 mmol/L (ref 18–29)
Calcium: 9.2 mg/dL (ref 8.7–10.2)
Chloride: 101 mmol/L (ref 97–106)
Creatinine, Ser: 1.07 mg/dL — ABNORMAL HIGH (ref 0.57–1.00)
GFR calc Af Amer: 69 mL/min/1.73
GFR calc non Af Amer: 60 mL/min/1.73
Globulin, Total: 2.9 g/dL (ref 1.5–4.5)
Glucose: 97 mg/dL (ref 65–99)
Potassium: 4.7 mmol/L (ref 3.5–5.2)
Sodium: 139 mmol/L (ref 136–144)
Total Protein: 7.1 g/dL (ref 6.0–8.5)

## 2015-12-03 LAB — CBC WITH DIFFERENTIAL/PLATELET
Basophils Absolute: 0 10*3/uL (ref 0.0–0.2)
Basos: 0 %
EOS (ABSOLUTE): 0.1 10*3/uL (ref 0.0–0.4)
EOS: 1 %
HEMOGLOBIN: 14.1 g/dL (ref 11.1–15.9)
Hematocrit: 42.1 % (ref 34.0–46.6)
Immature Grans (Abs): 0 10*3/uL (ref 0.0–0.1)
Immature Granulocytes: 1 %
LYMPHS ABS: 3 10*3/uL (ref 0.7–3.1)
Lymphs: 37 %
MCH: 30.5 pg (ref 26.6–33.0)
MCHC: 33.5 g/dL (ref 31.5–35.7)
MCV: 91 fL (ref 79–97)
MONOCYTES: 7 %
MONOS ABS: 0.6 10*3/uL (ref 0.1–0.9)
NEUTROS ABS: 4.5 10*3/uL (ref 1.4–7.0)
Neutrophils: 54 %
Platelets: 265 10*3/uL (ref 150–379)
RBC: 4.63 x10E6/uL (ref 3.77–5.28)
RDW: 13.7 % (ref 12.3–15.4)
WBC: 8.1 10*3/uL (ref 3.4–10.8)

## 2015-12-03 LAB — FSH/LH
FSH: 73.3 m[IU]/mL
LH: 51.2 m[IU]/mL

## 2015-12-03 LAB — HIV ANTIBODY (ROUTINE TESTING W REFLEX): HIV Screen 4th Generation wRfx: NONREACTIVE

## 2015-12-03 LAB — COMMENT2 - HEP PANEL

## 2015-12-03 LAB — HEPATITIS C ANTIBODY (REFLEX): HCV Ab: >11 s/co ratio — ABNORMAL HIGH (ref 0.0–0.9)

## 2015-12-04 ENCOUNTER — Other Ambulatory Visit: Payer: Self-pay | Admitting: Family Medicine

## 2015-12-04 LAB — URINE CULTURE

## 2015-12-04 MED ORDER — AMOXICILLIN 500 MG PO TABS: 500.0000 mg | ORAL_TABLET | Freq: Three times a day (TID) | ORAL | Status: AC

## 2015-12-06 ENCOUNTER — Other Ambulatory Visit: Payer: Medicare Other | Admitting: Family

## 2015-12-09 ENCOUNTER — Ambulatory Visit (INDEPENDENT_AMBULATORY_CARE_PROVIDER_SITE_OTHER): Payer: Medicare Other | Admitting: Family

## 2015-12-09 ENCOUNTER — Encounter: Payer: Self-pay | Admitting: Family

## 2015-12-09 ENCOUNTER — Other Ambulatory Visit: Payer: Self-pay | Admitting: Family Medicine

## 2015-12-09 ENCOUNTER — Encounter (INDEPENDENT_AMBULATORY_CARE_PROVIDER_SITE_OTHER): Payer: Self-pay

## 2015-12-09 VITALS — BP 117/72 | HR 76 | Temp 97.9°F | Ht 63.5 in | Wt 178.0 lb

## 2015-12-09 DIAGNOSIS — F319 Bipolar disorder, unspecified: Secondary | ICD-10-CM

## 2015-12-09 DIAGNOSIS — N951 Menopausal and female climacteric states: Secondary | ICD-10-CM

## 2015-12-09 DIAGNOSIS — A6000 Herpesviral infection of urogenital system, unspecified: Secondary | ICD-10-CM | POA: Insufficient documentation

## 2015-12-09 DIAGNOSIS — Z01419 Encounter for gynecological examination (general) (routine) without abnormal findings: Secondary | ICD-10-CM | POA: Diagnosis not present

## 2015-12-09 DIAGNOSIS — R87619 Unspecified abnormal cytological findings in specimens from cervix uteri: Secondary | ICD-10-CM | POA: Diagnosis not present

## 2015-12-09 DIAGNOSIS — J309 Allergic rhinitis, unspecified: Secondary | ICD-10-CM | POA: Diagnosis not present

## 2015-12-09 DIAGNOSIS — F259 Schizoaffective disorder, unspecified: Secondary | ICD-10-CM

## 2015-12-09 DIAGNOSIS — Z1211 Encounter for screening for malignant neoplasm of colon: Secondary | ICD-10-CM

## 2015-12-09 DIAGNOSIS — E785 Hyperlipidemia, unspecified: Secondary | ICD-10-CM

## 2015-12-09 DIAGNOSIS — Z0000 Encounter for general adult medical examination without abnormal findings: Secondary | ICD-10-CM

## 2015-12-09 DIAGNOSIS — G4700 Insomnia, unspecified: Secondary | ICD-10-CM

## 2015-12-09 LAB — POCT URINALYSIS DIPSTICK
Bilirubin, UA: NEGATIVE
Blood, UA: NEGATIVE
Glucose, UA: NEGATIVE
Ketones, UA: NEGATIVE
LEUKOCYTES UA: NEGATIVE
NITRITE UA: NEGATIVE
PH UA: 6.5
PROTEIN UA: NEGATIVE
Spec Grav, UA: <=1.005
UROBILINOGEN UA: NEGATIVE

## 2015-12-09 LAB — POCT UA - MICROSCOPIC ONLY
BACTERIA, U MICROSCOPIC: NEGATIVE
CRYSTALS, UR, HPF, POC: NEGATIVE
Casts, Ur, LPF, POC: NEGATIVE
MUCUS UA: NEGATIVE
YEAST UA: NEGATIVE

## 2015-12-09 MED ORDER — ATORVASTATIN CALCIUM 20 MG PO TABS: 20.0000 mg | ORAL_TABLET | Freq: Every day | ORAL | Status: DC

## 2015-12-09 MED ORDER — VALACYCLOVIR HCL 500 MG PO TABS: 500.0000 mg | ORAL_TABLET | Freq: Two times a day (BID) | ORAL | Status: DC

## 2015-12-09 MED ORDER — FLUTICASONE PROPIONATE 50 MCG/ACT NA SUSP: 2.0000 | Freq: Every day | NASAL | Status: DC

## 2015-12-09 MED ORDER — IBUPROFEN 800 MG PO TABS: 800.0000 mg | ORAL_TABLET | Freq: Three times a day (TID) | ORAL | Status: DC | PRN

## 2015-12-09 NOTE — Progress Notes (Addendum)
Subjective:    Patient ID: Lauren Fuentes, female    DOB: [birth year], 49 y.o.   MRN: [id number]  Pt presents to the office today for CPE with pap. Pt goes to [facility] every 4 months who treats her Bipolar, Schizoaffective disorder, Insomnia. Pt is currently being treated for Trichomonas. Pt states she is having a lot of menopausal symptoms. Pt states she is having hot flashes, weight gain, facial hair, dyspareunia, and  mood swings. Pt discussed this with Dr. Livia Snellen and was started on activella on 12/02/15.  Gynecologic Exam Pertinent negatives include no headaches.  Hyperlipidemia This is a new problem. The current episode started more than 1 year ago. The problem is uncontrolled. Recent lipid tests were reviewed and are high. Pertinent negatives include no shortness of breath. Current antihyperlipidemic treatment includes diet change. The current treatment provides mild improvement of lipids. Risk factors for coronary artery disease include obesity, a sedentary lifestyle, post-menopausal and family history.      Review of Systems  Constitutional: Negative.   HENT: Negative.   Eyes: Negative.   Respiratory: Negative.  Negative for shortness of breath.   Cardiovascular: Negative.  Negative for palpitations.  Gastrointestinal: Negative.   Endocrine: Negative.   Genitourinary: Negative.   Musculoskeletal: Negative.   Neurological: Negative.  Negative for headaches.  Hematological: Negative.   Psychiatric/Behavioral: Negative.   All other systems reviewed and are negative.      Objective:   Physical Exam  Constitutional: She is oriented to person, place, and time. She appears well-developed and well-nourished. No distress.  HENT:  Head: Normocephalic and atraumatic.  Right Ear: External ear normal.  Left Ear: External ear normal.  Nasal passage erythemas with mild swelling  Oropharynx erythemas  Eyes: Pupils are equal, round, and reactive to light.  Neck: Normal range  of motion. Neck supple. No thyromegaly present.  Cardiovascular: Normal rate, regular rhythm, normal heart sounds and intact distal pulses.   No murmur heard. Pulmonary/Chest: Effort normal and breath sounds normal. No respiratory distress. She has no wheezes. Right breast exhibits no inverted nipple, no mass, no nipple discharge, no skin change and no tenderness. Left breast exhibits no inverted nipple, no mass, no nipple discharge, no skin change and no tenderness. Breasts are symmetrical.  Abdominal: Soft. Bowel sounds are normal. She exhibits no distension. There is no tenderness.  Genitourinary: Vagina normal.  Bimanual exam- no adnexal masses or tenderness, ovaries nonpalpable   Cervix parous and pink- No discharge   Musculoskeletal: Normal range of motion. She exhibits no edema or tenderness.  Neurological: She is alert and oriented to person, place, and time. She has normal reflexes. No cranial nerve deficit.  Skin: Skin is warm and dry.  Psychiatric: Her behavior is normal. Judgment and thought content normal. Her mood appears anxious.  Pt tearful during exam  Vitals reviewed.   BP 117/72 mmHg  Pulse 76  Temp(Src) 97.9 F (36.6 C) (Oral)  Ht 5' 3.5" (1.613 m)  Wt 178 lb (80.74 kg)  BMI 31.03 kg/m2  LMP 10/21/2015 (Approximate)       Assessment & Plan:  1. Encounter for routine gynecological examination - POCT UA - Microscopic Only - POCT urinalysis dipstick - CMP14+EGFR - Pap IG, CT/NG w/ reflex HPV when ASC-U  2. Bipolar 1 disorder ([facility]) - CMP14+EGFR  3. Schizoaffective disorder, unspecified type ([place]) - CMP14+EGFR  4. Allergic rhinitis, unspecified allergic rhinitis type - CMP14+EGFR  5. Insomnia - CMP14+EGFR  6. Genital herpes - CMP14+EGFR  7.  Hyperlipidemia - CMP14+EGFR  8. Annual physical exam - CMP14+EGFR - Anemia Profile B - Thyroid Panel With TSH - VITAMIN D 25 Hydroxy (Vit-D Deficiency, Fractures) - Pap IG, CT/NG w/ reflex HPV when  ASC-U  9. Menopausal symptom -Continue with Activella -Discussed pt may want to discuss with Behavioral Health and swithc Prozac to Cymbalta that may help with menopausal symtoms   10. Encounter for screening colonoscopy - Fecal occult blood, imunochemical; Future - Ambulatory referral to Gastroenterology   Continue all meds, and chronic follow up appts Labs pending Health Maintenance reviewed, Pt to make mammogram appt Diet and exercise encouraged RTO as needed and keep chronic follow up appts with Dr. Bernell List, FNP

## 2015-12-09 NOTE — Addendum Note (Signed)
Addended by: Earlene Plater on: 12/09/2015 11:13 AM   Modules accepted: Miquel Dunn

## 2015-12-09 NOTE — Addendum Note (Signed)
Addended by: Evelina Dun A on: 12/09/2015 10:59 AM   Modules accepted: Orders

## 2015-12-09 NOTE — Addendum Note (Signed)
Addended by: Zannie Cove on: 12/09/2015 11:30 AM   Modules accepted: Orders

## 2015-12-09 NOTE — Patient Instructions (Addendum)

## 2015-12-10 LAB — ANEMIA PROFILE B
BASOS: 0 %
Basophils Absolute: 0 10*3/uL (ref 0.0–0.2)
EOS (ABSOLUTE): 0 10*3/uL (ref 0.0–0.4)
EOS: 1 %
FERRITIN: 42 ng/mL (ref 15–150)
Folate: 16.5 ng/mL (ref >3.0)
HEMATOCRIT: 42.1 % (ref 34.0–46.6)
HEMOGLOBIN: 14.1 g/dL (ref 11.1–15.9)
IMMATURE GRANULOCYTES: 0 %
IRON SATURATION: 45 % (ref 15–55)
IRON: 158 ug/dL (ref 27–159)
Immature Grans (Abs): 0 10*3/uL (ref 0.0–0.1)
LYMPHS ABS: 3.3 10*3/uL — AB (ref 0.7–3.1)
Lymphs: 46 %
MCH: 30.3 pg (ref 26.6–33.0)
MCHC: 33.5 g/dL (ref 31.5–35.7)
MCV: 90 fL (ref 79–97)
Monocytes Absolute: 0.5 10*3/uL (ref 0.1–0.9)
Monocytes: 7 %
NEUTROS PCT: 46 %
Neutrophils Absolute: 3.3 10*3/uL (ref 1.4–7.0)
Platelets: 274 10*3/uL (ref 150–379)
RBC: 4.66 x10E6/uL (ref 3.77–5.28)
RDW: 13.9 % (ref 12.3–15.4)
Retic Ct Pct: 1.1 % (ref 0.6–2.6)
TIBC: 354 ug/dL (ref 250–450)
UIBC: 196 ug/dL (ref 131–425)
Vitamin B-12: 370 pg/mL (ref 211–946)
WBC: 7.2 10*3/uL (ref 3.4–10.8)

## 2015-12-10 LAB — CMP14+EGFR
ALK PHOS: 54 IU/L (ref 39–117)
ALT: 17 IU/L (ref 0–32)
AST: 29 IU/L (ref 0–40)
Albumin/Globulin Ratio: 1.4 (ref 1.1–2.5)
Albumin: 4.1 g/dL (ref 3.5–5.5)
BUN/Creatinine Ratio: 8 — ABNORMAL LOW (ref 9–23)
BUN: 10 mg/dL (ref 6–24)
Bilirubin Total: 0.3 mg/dL (ref 0.0–1.2)
CALCIUM: 9.3 mg/dL (ref 8.7–10.2)
CO2: 23 mmol/L (ref 18–29)
CREATININE: 1.18 mg/dL — AB (ref 0.57–1.00)
Chloride: 101 mmol/L (ref 96–106)
GFR calc Af Amer: 62 mL/min/{1.73_m2} (ref >59)
GFR calc non Af Amer: 54 mL/min/{1.73_m2} — ABNORMAL LOW (ref >59)
GLUCOSE: 85 mg/dL (ref 65–99)
Globulin, Total: 2.9 g/dL (ref 1.5–4.5)
Potassium: 5 mmol/L (ref 3.5–5.2)
SODIUM: 139 mmol/L (ref 134–144)
Total Protein: 7 g/dL (ref 6.0–8.5)

## 2015-12-10 LAB — THYROID PANEL WITH TSH
FREE THYROXINE INDEX: 1.8 (ref 1.2–4.9)
T3 UPTAKE RATIO: 22 % — AB (ref 24–39)
T4, Total: 8.4 ug/dL (ref 4.5–12.0)
TSH: 0.691 u[IU]/mL (ref 0.450–4.500)

## 2015-12-10 LAB — VITAMIN D 25 HYDROXY (VIT D DEFICIENCY, FRACTURES): Vit D, 25-Hydroxy: 25.5 ng/mL — ABNORMAL LOW (ref 30.0–100.0)

## 2015-12-12 ENCOUNTER — Ambulatory Visit: Payer: Medicare Other | Attending: Family Medicine | Admitting: Physical Therapy

## 2015-12-12 DIAGNOSIS — R6889 Other general symptoms and signs: Secondary | ICD-10-CM | POA: Insufficient documentation

## 2015-12-12 DIAGNOSIS — M2560 Stiffness of unspecified joint, not elsewhere classified: Secondary | ICD-10-CM | POA: Insufficient documentation

## 2015-12-12 DIAGNOSIS — M545 Low back pain, unspecified: Secondary | ICD-10-CM

## 2015-12-12 DIAGNOSIS — R293 Abnormal posture: Secondary | ICD-10-CM | POA: Diagnosis not present

## 2015-12-12 DIAGNOSIS — M542 Cervicalgia: Secondary | ICD-10-CM | POA: Diagnosis not present

## 2015-12-12 DIAGNOSIS — R262 Difficulty in walking, not elsewhere classified: Secondary | ICD-10-CM | POA: Insufficient documentation
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
## 2015-12-14 ENCOUNTER — Other Ambulatory Visit: Payer: Self-pay | Admitting: Orthopedic Surgery

## 2015-12-14 DIAGNOSIS — M19171 Post-traumatic osteoarthritis, right ankle and foot: Secondary | ICD-10-CM

## 2015-12-14 DIAGNOSIS — M12571 Traumatic arthropathy, right ankle and foot: Secondary | ICD-10-CM | POA: Diagnosis not present

## 2015-12-14 DIAGNOSIS — M19172 Post-traumatic osteoarthritis, left ankle and foot: Secondary | ICD-10-CM

## 2015-12-14 DIAGNOSIS — M19272 Secondary osteoarthritis, left ankle and foot: Secondary | ICD-10-CM | POA: Diagnosis not present

## 2015-12-14 LAB — PAP IG, CT-NG, RFX HPV ASCU
Chlamydia, Nuc. Acid Amp: NEGATIVE
GONOCOCCUS BY NUCLEIC ACID AMP: NEGATIVE
PAP Smear Comment: 0

## 2015-12-15 ENCOUNTER — Other Ambulatory Visit: Payer: Self-pay | Admitting: Family

## 2015-12-15 ENCOUNTER — Telehealth: Payer: Self-pay | Admitting: *Deleted

## 2015-12-15 ENCOUNTER — Ambulatory Visit: Payer: Medicare Other | Admitting: Physical Therapy

## 2015-12-15 ENCOUNTER — Encounter: Payer: Self-pay | Admitting: Family

## 2015-12-15 ENCOUNTER — Ambulatory Visit (INDEPENDENT_AMBULATORY_CARE_PROVIDER_SITE_OTHER): Payer: Medicare Other | Admitting: Family

## 2015-12-15 VITALS — BP 117/70 | HR 73 | Temp 97.5°F | Ht 63.5 in | Wt 181.0 lb

## 2015-12-15 DIAGNOSIS — R8290 Unspecified abnormal findings in urine: Secondary | ICD-10-CM

## 2015-12-15 DIAGNOSIS — M542 Cervicalgia: Secondary | ICD-10-CM | POA: Diagnosis not present

## 2015-12-15 DIAGNOSIS — N760 Acute vaginitis: Secondary | ICD-10-CM

## 2015-12-15 DIAGNOSIS — A499 Bacterial infection, unspecified: Secondary | ICD-10-CM | POA: Diagnosis not present

## 2015-12-15 DIAGNOSIS — R6889 Other general symptoms and signs: Secondary | ICD-10-CM | POA: Diagnosis not present

## 2015-12-15 DIAGNOSIS — M2560 Stiffness of unspecified joint, not elsewhere classified: Secondary | ICD-10-CM

## 2015-12-15 DIAGNOSIS — R262 Difficulty in walking, not elsewhere classified: Secondary | ICD-10-CM

## 2015-12-15 DIAGNOSIS — R293 Abnormal posture: Secondary | ICD-10-CM

## 2015-12-15 DIAGNOSIS — M545 Low back pain, unspecified: Secondary | ICD-10-CM

## 2015-12-15 DIAGNOSIS — B9689 Other specified bacterial agents as the cause of diseases classified elsewhere: Secondary | ICD-10-CM

## 2015-12-15 DIAGNOSIS — R8299 Other abnormal findings in urine: Secondary | ICD-10-CM | POA: Diagnosis not present

## 2015-12-15 DIAGNOSIS — B182 Chronic viral hepatitis C: Secondary | ICD-10-CM | POA: Insufficient documentation

## 2015-12-15 DIAGNOSIS — R768 Other specified abnormal immunological findings in serum: Secondary | ICD-10-CM

## 2015-12-15 LAB — POCT URINALYSIS DIPSTICK
Bilirubin, UA: NEGATIVE
GLUCOSE UA: NEGATIVE
Ketones, UA: NEGATIVE
Leukocytes, UA: NEGATIVE
NITRITE UA: NEGATIVE
Protein, UA: NEGATIVE
Spec Grav, UA: <=1.005
UROBILINOGEN UA: NEGATIVE
pH, UA: 6

## 2015-12-15 LAB — POCT UA - MICROSCOPIC ONLY
CASTS, UR, LPF, POC: NEGATIVE
CRYSTALS, UR, HPF, POC: NEGATIVE
MUCUS UA: NEGATIVE
WBC, UR, HPF, POC: NEGATIVE
Yeast, UA: NEGATIVE

## 2015-12-15 LAB — POCT WET PREP (WET MOUNT): TRICHOMONAS WET PREP HPF POC: NEGATIVE

## 2015-12-15 MED ORDER — VITAMIN D (ERGOCALCIFEROL) 1.25 MG (50000 UNIT) PO CAPS: 50000.0000 [IU] | ORAL_CAPSULE | ORAL | Status: DC

## 2015-12-15 MED ORDER — METRONIDAZOLE 500 MG PO TABS: 500.0000 mg | ORAL_TABLET | Freq: Two times a day (BID) | ORAL | Status: DC

## 2015-12-15 NOTE — Progress Notes (Signed)
   Subjective:    Patient ID: Lauren Fuentes, female    DOB: 1966/06/17, 49 y.o.   MRN: [id number]  Urinary Frequency  This is a new problem. The current episode started in the past 7 days. The problem occurs intermittently. The problem has been unchanged. The pain is at a severity of 0/10. The patient is experiencing no pain. There is no history of pyelonephritis. Associated symptoms include a discharge, flank pain, frequency, hematuria and urgency. Pertinent negatives include no chills, nausea or vomiting. Associated symptoms comments: Cloudy urine . She has tried increased fluids and antibiotics for the symptoms. The treatment provided mild relief.      Review of Systems  Constitutional: Negative.  Negative for chills.  HENT: Negative.   Eyes: Negative.   Respiratory: Negative.  Negative for shortness of breath.   Cardiovascular: Negative.  Negative for palpitations.  Gastrointestinal: Negative.  Negative for nausea and vomiting.  Endocrine: Negative.   Genitourinary: Positive for urgency, frequency, hematuria and flank pain.  Musculoskeletal: Negative.   Neurological: Negative.  Negative for headaches.  Hematological: Negative.   Psychiatric/Behavioral: Negative.   All other systems reviewed and are negative.      Objective:   Physical Exam  Constitutional: She is oriented to person, place, and time. She appears well-developed and well-nourished. No distress.  HENT:  Head: Normocephalic and atraumatic.  Eyes: Pupils are equal, round, and reactive to light.  Neck: Normal range of motion. Neck supple. No thyromegaly present.  Cardiovascular: Normal rate, regular rhythm, normal heart sounds and intact distal pulses.   No murmur heard. Pulmonary/Chest: Effort normal and breath sounds normal. No respiratory distress. She has no wheezes.  Abdominal: Soft. Bowel sounds are normal. She exhibits no distension. There is no tenderness.  Musculoskeletal: Normal range of motion. She  exhibits no edema or tenderness.  Negative for CVA tenderness   Neurological: She is alert and oriented to person, place, and time. She has normal reflexes. No cranial nerve deficit.  Skin: Skin is warm and dry.  Psychiatric: She has a normal mood and affect. Her behavior is normal. Judgment and thought content normal.  Vitals reviewed.   BP 117/70 mmHg  Pulse 73  Temp(Src) 97.5 F (36.4 C) (Oral)  Ht 5' 3.5" (1.613 m)  Wt 181 lb (82.101 kg)  BMI 31.56 kg/m2  LMP 10/21/2015 (Approximate)       Assessment & Plan:  1. Cloudy urine - POCT urinalysis dipstick - POCT UA - Microscopic Only - POCT Wet Prep [facility])  2. BV (bacterial vaginosis) -Keep clean and dry -No douche -No sex until infection cleared -RTO prn - metroNIDAZOLE (FLAGYL) 500 MG tablet; Take 1 tablet (500 mg total) by mouth 2 (two) times daily.  Dispense: 14 tablet; Refill: 0  Evelina Dun, FNP

## 2015-12-15 NOTE — Telephone Encounter (Signed)
Patient was concerned about results of her hepatitis test.  Could you review and give information for the nurse to tell her.

## 2015-12-15 NOTE — Therapy (Signed)
[facility] [place], [place], [address] Phone: [phone]   Fax:  [phone]  Physical Therapy Treatment  Patient Details  Name: Lauren Fuentes MRN: [id number] Date of Birth: [birth year] Referring Provider: Claretta Fraise MD  Encounter Date: 12/15/2015      PT End of Session - 12/15/15 1658    Visit Number 2   Number of Visits 16   Date for PT Re-Evaluation 02/06/16   PT Start Time [id number]   PT Stop Time 0348  Patient unable to tolerate a full treatment.   PT Time Calculation (min) 33 min   Activity Tolerance Patient limited by pain   Behavior During Therapy Restless      Past Medical History  Diagnosis Date  . Bipolar 1 disorder ([place])   . Schizoaffective disorder ([place])   . Post traumatic stress disorder (PTSD)   . Calcaneal fracture, Left  10/19/2014  . Thoracic compression fracture, T12 endplate  624THL  . Lumbar compression fracture, L1-2 endplate  624THL  . Fracture of metatarsal bone of right foot, 4th neck 10/22/2014  . Back pain, chronic   . GERD (gastroesophageal reflux disease)   . Arthritis   . Headache     hx migraines, tension headaches  . Hepatitis     stated in the past - pt denies this as of 07/28/15)    Past Surgical History  Procedure Laterality Date  . Tubal ligation    . Orif calcaneous fracture Left 10/21/2014    Procedure: OPEN REDUCTION INTERNAL FIXATION (ORIF) LEFT CALCANEOUS FRACTURE;  Surgeon: Rozanna Box, MD;  Location: [place];  Service: Orthopedics;  Laterality: Left;  Marland Kitchen Kyphoplasty N/A 02/17/2015    Procedure: T12 KYPHOPLASTY;  Surgeon: Melina Schools, MD;  Location: [place];  Service: Orthopedics;  Laterality: N/A;  . Colonoscopy    . Hardware removal Left 07/29/2015    Procedure: HARDWARE REMOVAL,LEFT HEEL;  Surgeon: Altamese [place], MD;  Location: [place];  Service: Orthopedics;  Laterality: Left;  . Steriod injection Right 07/29/2015    Procedure: Subtalar Joint injection with  Fluoro guidance ;  Surgeon: Altamese [place], MD;  Location: [place];  Service: Orthopedics;  Laterality: Right;    There were no vitals filed for this visit.  Visit Diagnosis:  Bilateral low back pain without sciatica  Neck pain  Stiffness of joints, multiple sites  Activity intolerance  Abnormal posture  Difficulty walking  Joint stiffness of spine      Subjective Assessment - 12/15/15 1614    Subjective Patient reporting a very high pain-level and she cannot get comfortable.   Pain Score 8    Pain Location Neck   Pain Orientation Right;Left   Pain Descriptors / Indicators Pins and needles   Pain Type Acute pain   Pain Onset More than a month ago   Pain Frequency Constant   Pain Score 8   Pain Location Back   Pain Orientation Right;Left   Pain Descriptors / Indicators Sharp   Pain Type Chronic pain   Pain Onset More than a month ago                         [facility] Adult PT Treatment/Exercise - 12/15/15 0001    Modalities   Modalities Electrical Stimulation   Electrical Stimulation   Electrical Stimulation Location Bilateral UT's.   Electrical Stimulation Action Pre-mod (5 sec on and 5 sec off) x 15 minutes to neck but patient only tolerated  12 minutes to her low back at a low output of 6).   Electrical Stimulation Parameters 80-150 HZ.   Electrical Stimulation Goals Pain   Manual Therapy   Manual therapy comments Light STW/M to patient's bilateral low back musculature with patient in the left sdly position with a pillow between knees for comfort x 8 minutes.                  PT Short Term Goals - 12/12/15 1223    PT SHORT TERM GOAL #1   Title I with initial HEP (01/09/16)   Time 4   Period Weeks   Status New   PT SHORT TERM GOAL #2   Title decreased pain to 5/10 overall (01/09/16)   Time 4   Period Weeks   Status New   PT SHORT TERM GOAL #3   Title improved cervical rotation by 20 degrees B (01/09/16)   Time 4   Period Weeks    Status New           PT Long Term Goals - 12/12/15 1226    PT LONG TERM GOAL #1   Title I with advanced HEP   Time 8   Period Weeks   Status New   PT LONG TERM GOAL #2   Title Decreased pain overall by 50% since evaluation with ADLS   Time 8   Period Weeks   Status New   PT LONG TERM GOAL #3   Title Patient able to stand to do ADLs for 30 min before needing to sit.   Time 8   Period Weeks   Status New   PT LONG TERM GOAL #4   Title Patient able to walk for 30 min before needing stop for pain   Time 8   Period Weeks   Status New               Problem List Patient Active Problem List   Diagnosis Date Noted  . Allergic rhinitis 12/09/2015  . Insomnia 12/09/2015  . Genital herpes 12/09/2015  . Hyperlipidemia 12/09/2015  . Menopausal symptom 12/09/2015  . Compression fracture of body of thoracic vertebra 02/17/2015  . Lumbar compression fracture, L1-2 endplate  624THL  . Thoracic compression fracture, T12 endplate  624THL  . Fracture of metatarsal bone of right foot, 4th neck 10/22/2014  . Has jumped from building 10/22/2014  . Bipolar 1 disorder ([place])   . Schizoaffective disorder ([place])   . Calcaneal fracture, Left  10/19/2014    Juliette Standre, [place] MPT 12/15/2015, 5:16 PM  [facility] [place], [place], [address] Phone: [phone]   Fax:  [phone]  Name: Lauren Fuentes MRN: [id number] Date of Birth: Sep 16, 1966

## 2015-12-15 NOTE — Telephone Encounter (Signed)
Called adn discussed.   Recommended HCV confirmatory testing. Ordered future lab, she will come in for blood draw.   Laroy Apple, MD [facility] 12/15/2015, 6:40 PM

## 2015-12-15 NOTE — Patient Instructions (Signed)

## 2015-12-16 ENCOUNTER — Ambulatory Visit: Payer: Self-pay | Admitting: Family

## 2015-12-16 ENCOUNTER — Telehealth: Payer: Self-pay | Admitting: Family

## 2015-12-16 ENCOUNTER — Other Ambulatory Visit (INDEPENDENT_AMBULATORY_CARE_PROVIDER_SITE_OTHER): Payer: Medicare Other

## 2015-12-16 DIAGNOSIS — R894 Abnormal immunological findings in specimens from other organs, systems and tissues: Secondary | ICD-10-CM | POA: Diagnosis not present

## 2015-12-16 DIAGNOSIS — R768 Other specified abnormal immunological findings in serum: Secondary | ICD-10-CM

## 2015-12-16 NOTE — Progress Notes (Signed)
Lab only 

## 2015-12-17 LAB — HCV RNA QUANT
HCV LOG10: 1.301 {Log_IU}/mL
HEPATITIS C QUANTITATION: 20 [IU]/mL

## 2015-12-18 ENCOUNTER — Other Ambulatory Visit: Payer: Self-pay | Admitting: Family Medicine

## 2015-12-18 DIAGNOSIS — B182 Chronic viral hepatitis C: Secondary | ICD-10-CM

## 2015-12-20 ENCOUNTER — Encounter: Payer: Medicare Other | Admitting: *Deleted

## 2015-12-28 ENCOUNTER — Ambulatory Visit
Admission: RE | Admit: 2015-12-28 | Discharge: 2015-12-28 | Disposition: A | Discharge status: Home / Self Care | Payer: Medicare Other | Source: Ambulatory Visit | Attending: Orthopedic Surgery | Admitting: Orthopedic Surgery

## 2015-12-28 DIAGNOSIS — M79672 Pain in left foot: Secondary | ICD-10-CM | POA: Diagnosis not present

## 2015-12-28 DIAGNOSIS — M19171 Post-traumatic osteoarthritis, right ankle and foot: Secondary | ICD-10-CM

## 2015-12-28 DIAGNOSIS — S99922A Unspecified injury of left foot, initial encounter: Secondary | ICD-10-CM | POA: Diagnosis not present

## 2015-12-28 DIAGNOSIS — M19172 Post-traumatic osteoarthritis, left ankle and foot: Secondary | ICD-10-CM

## 2015-12-28 DIAGNOSIS — M79671 Pain in right foot: Secondary | ICD-10-CM | POA: Diagnosis not present

## 2015-12-28 DIAGNOSIS — S99921A Unspecified injury of right foot, initial encounter: Secondary | ICD-10-CM | POA: Diagnosis not present

## 2015-12-28 MED ORDER — IOHEXOL 180 MG/ML  SOLN
1.0000 mL | Freq: Once | INTRAMUSCULAR | Status: AC | PRN
  Administered 2015-12-28: 1 mL via INTRA_ARTICULAR

## 2015-12-28 MED ORDER — METHYLPREDNISOLONE ACETATE 40 MG/ML INJ SUSP (RADIOLOG
120.0000 mg | Freq: Once | INTRAMUSCULAR | Status: AC
  Administered 2015-12-28: 120 mg via INTRA_ARTICULAR

## 2015-12-29 ENCOUNTER — Encounter: Payer: Medicare Other | Admitting: Physical Therapy

## 2016-01-05 ENCOUNTER — Ambulatory Visit: Payer: Medicare Other | Attending: Family Medicine | Admitting: *Deleted

## 2016-01-05 DIAGNOSIS — R6889 Other general symptoms and signs: Secondary | ICD-10-CM | POA: Diagnosis not present

## 2016-01-05 DIAGNOSIS — R262 Difficulty in walking, not elsewhere classified: Secondary | ICD-10-CM | POA: Diagnosis not present

## 2016-01-05 DIAGNOSIS — M545 Low back pain, unspecified: Secondary | ICD-10-CM

## 2016-01-05 DIAGNOSIS — M2560 Stiffness of unspecified joint, not elsewhere classified: Secondary | ICD-10-CM | POA: Diagnosis not present

## 2016-01-05 DIAGNOSIS — M542 Cervicalgia: Secondary | ICD-10-CM | POA: Diagnosis not present

## 2016-01-05 DIAGNOSIS — R293 Abnormal posture: Secondary | ICD-10-CM | POA: Diagnosis not present

## 2016-01-05 NOTE — Therapy (Signed)
[facility] [place], [place], [address] Phone: [phone]   Fax:  [phone]  Physical Therapy Treatment  Patient Details  Name: Lauren Fuentes MRN: [id number] Date of Birth: [birth year] Referring Provider: Claretta Fraise MD  Encounter Date: 01/05/2016      PT End of Session - 01/05/16 1003    Visit Number 3   Number of Visits 16   Date for PT Re-Evaluation 02/06/16   PT Start Time 0945   PT Stop Time [id number]   PT Time Calculation (min) 50 min      Past Medical History  Diagnosis Date  . Bipolar 1 disorder ([place])   . Schizoaffective disorder ([place])   . Post traumatic stress disorder (PTSD)   . Calcaneal fracture, Left  10/19/2014  . Thoracic compression fracture, T12 endplate  624THL  . Lumbar compression fracture, L1-2 endplate  624THL  . Fracture of metatarsal bone of right foot, 4th neck 10/22/2014  . Back pain, chronic   . GERD (gastroesophageal reflux disease)   . Arthritis   . Headache     hx migraines, tension headaches  . Hepatitis     stated in the past - pt denies this as of 07/28/15)    Past Surgical History  Procedure Laterality Date  . Tubal ligation    . Orif calcaneous fracture Left 10/21/2014    Procedure: OPEN REDUCTION INTERNAL FIXATION (ORIF) LEFT CALCANEOUS FRACTURE;  Surgeon: Rozanna Box, MD;  Location: [facility];  Service: Orthopedics;  Laterality: Left;  Marland Kitchen Kyphoplasty N/A 02/17/2015    Procedure: T12 KYPHOPLASTY;  Surgeon: Melina Schools, MD;  Location: [place];  Service: Orthopedics;  Laterality: N/A;  . Colonoscopy    . Hardware removal Left 07/29/2015    Procedure: HARDWARE REMOVAL,LEFT HEEL;  Surgeon: Altamese [place], MD;  Location: [place];  Service: Orthopedics;  Laterality: Left;  . Steriod injection Right 07/29/2015    Procedure: Subtalar Joint injection with Fluoro guidance ;  Surgeon: Altamese [place], MD;  Location: [place];  Service: Orthopedics;  Laterality: Right;    There were no  vitals filed for this visit.  Visit Diagnosis:  Bilateral low back pain without sciatica  Neck pain  Stiffness of joints, multiple sites  Activity intolerance  Abnormal posture  Difficulty walking  Joint stiffness of spine      Subjective Assessment - 01/05/16 0950    Subjective Patient reporting a very high pain-level and she cannot get comfortable , but doing better today   Pertinent History OP, T12 kyphoplasty 2/16; compresson fx T12 and L1 2015, L calcaneal ORIF 10/15   Diagnostic tests xrays, MRI  Sept 2015   Patient Stated Goals to get out of pain   Currently in Pain? Yes   Pain Score 4    Pain Location Neck   Pain Orientation Right;Left   Pain Descriptors / Indicators Pins and needles   Pain Type Acute pain   Pain Onset More than a month ago   Pain Score 5   Pain Location Back   Pain Orientation Right;Left   Pain Descriptors / Indicators Sharp   Aggravating Factors  supine   Pain Relieving Factors heat                         [facility] Adult PT Treatment/Exercise - 01/05/16 0001    Modalities   Modalities Electrical Stimulation;Ultrasound   Moist Heat Therapy   Number Minutes Moist Heat 15 Minutes  Moist Heat Location Cervical;Lumbar Spine   Electrical Stimulation   Electrical Stimulation Location  low back   Electrical Stimulation Action premod 80-150hz  to Bil UTs and LB paras   Electrical Stimulation Goals Pain   Ultrasound   Ultrasound Location LB paras   Ultrasound Parameters 1.5 w/cm2 x12 mins   Ultrasound Goals Pain   Manual Therapy   Manual therapy comments Light STW/M to patient's bilateral low back musculature with patient in the left sdly position with a pillow between knees for comfort                  PT Short Term Goals - 12/12/15 1223    PT SHORT TERM GOAL #1   Title I with initial HEP (01/09/16)   Time 4   Period Weeks   Status New   PT SHORT TERM GOAL #2   Title decreased pain to 5/10 overall (01/09/16)    Time 4   Period Weeks   Status New   PT SHORT TERM GOAL #3   Title improved cervical rotation by 20 degrees B (01/09/16)   Time 4   Period Weeks   Status New           PT Long Term Goals - 12/12/15 1226    PT LONG TERM GOAL #1   Title I with advanced HEP   Time 8   Period Weeks   Status New   PT LONG TERM GOAL #2   Title Decreased pain overall by 50% since evaluation with ADLS   Time 8   Period Weeks   Status New   PT LONG TERM GOAL #3   Title Patient able to stand to do ADLs for 30 min before needing to sit.   Time 8   Period Weeks   Status New   PT LONG TERM GOAL #4   Title Patient able to walk for 30 min before needing stop for pain   Time 8   Period Weeks   Status New               Plan - 01/05/16 0948    Pt will benefit from skilled therapeutic intervention in order to improve on the following deficits Decreased range of motion;Decreased activity tolerance;Pain   Rehab Potential Good   PT Frequency 2x / week   PT Duration 8 weeks   PT Treatment/Interventions ADLs/Self Care Home Management;Electrical Stimulation;Cryotherapy;Moist Heat;Therapeutic exercise;Manual techniques;Patient/family education;Neuromuscular re-education;Ultrasound;Passive range of motion;Dry needling   PT Next Visit Plan continue modalities for pain, cervical ROM, STW to neck/back as tolerated, progress to spinal stabilization TE.   PT Home Exercise Plan cervical ROM        Problem List Patient Active Problem List   Diagnosis Date Noted  . Chronic hepatitis C virus infection ([place]) 12/15/2015  . Allergic rhinitis 12/09/2015  . Insomnia 12/09/2015  . Genital herpes 12/09/2015  . Hyperlipidemia 12/09/2015  . Menopausal symptom 12/09/2015  . Compression fracture of body of thoracic vertebra 02/17/2015  . Lumbar compression fracture, L1-2 endplate  624THL  . Thoracic compression fracture, T12 endplate  624THL  . Fracture of metatarsal bone of right foot, 4th neck  10/22/2014  . Has jumped from building 10/22/2014  . Bipolar 1 disorder ([place])   . Schizoaffective disorder ([place])   . Calcaneal fracture, Left  10/19/2014    RAMSEUR,CHRIS, PTA 01/05/2016, 1:04 PM  [facility] [address] [place], [place], [address] Phone: [phone]   Fax:  [phone]  Name: Lauren Fuentes MRN: [id number] Date of Birth: [birth year]

## 2016-01-11 ENCOUNTER — Encounter: Payer: Self-pay | Admitting: Physical Therapy

## 2016-01-11 ENCOUNTER — Ambulatory Visit: Payer: Medicare Other | Admitting: Physical Therapy

## 2016-01-11 DIAGNOSIS — R262 Difficulty in walking, not elsewhere classified: Secondary | ICD-10-CM

## 2016-01-11 DIAGNOSIS — R6889 Other general symptoms and signs: Secondary | ICD-10-CM | POA: Diagnosis not present

## 2016-01-11 DIAGNOSIS — M2560 Stiffness of unspecified joint, not elsewhere classified: Secondary | ICD-10-CM

## 2016-01-11 DIAGNOSIS — M542 Cervicalgia: Secondary | ICD-10-CM | POA: Diagnosis not present

## 2016-01-11 DIAGNOSIS — R293 Abnormal posture: Secondary | ICD-10-CM

## 2016-01-11 DIAGNOSIS — M545 Low back pain, unspecified: Secondary | ICD-10-CM

## 2016-01-11 NOTE — Therapy (Signed)
[facility] [place], [place], [address] Phone: [phone]   Fax:  [phone]  Physical Therapy Treatment  Patient Details  Name: Lauren Fuentes MRN: [id number] Date of Birth: November 01, 1966 Referring Provider: Claretta Fraise MD  Encounter Date: 01/11/2016      PT End of Session - 01/11/16 1303    Visit Number 4   Number of Visits 16   Date for PT Re-Evaluation 02/06/16   PT Start Time [id number]   PT Stop Time 1348   PT Time Calculation (min) 45 min   Activity Tolerance Patient limited by pain   Behavior During Therapy Restless      Past Medical History  Diagnosis Date  . Bipolar 1 disorder ([place])   . Schizoaffective disorder ([place])   . Post traumatic stress disorder (PTSD)   . Calcaneal fracture, Left  10/19/2014  . Thoracic compression fracture, T12 endplate  624THL  . Lumbar compression fracture, L1-2 endplate  624THL  . Fracture of metatarsal bone of right foot, 4th neck 10/22/2014  . Back pain, chronic   . GERD (gastroesophageal reflux disease)   . Arthritis   . Headache     hx migraines, tension headaches  . Hepatitis     stated in the past - pt denies this as of 07/28/15)    Past Surgical History  Procedure Laterality Date  . Tubal ligation    . Orif calcaneous fracture Left 10/21/2014    Procedure: OPEN REDUCTION INTERNAL FIXATION (ORIF) LEFT CALCANEOUS FRACTURE;  Surgeon: Rozanna Box, MD;  Location: [facility];  Service: Orthopedics;  Laterality: Left;  Marland Kitchen Kyphoplasty N/A 02/17/2015    Procedure: T12 KYPHOPLASTY;  Surgeon: Melina Schools, MD;  Location: [facility];  Service: Orthopedics;  Laterality: N/A;  . Colonoscopy    . Hardware removal Left 07/29/2015    Procedure: HARDWARE REMOVAL,LEFT HEEL;  Surgeon: Altamese [place], MD;  Location: [facility];  Service: Orthopedics;  Laterality: Left;  . Steriod injection Right 07/29/2015    Procedure: Subtalar Joint injection with Fluoro guidance ;  Surgeon: Altamese [place], MD;   Location: [facility];  Service: Orthopedics;  Laterality: Right;    There were no vitals filed for this visit.  Visit Diagnosis:  Bilateral low back pain without sciatica  Neck pain  Stiffness of joints, multiple sites  Activity intolerance  Abnormal posture  Difficulty walking  Joint stiffness of spine      Subjective Assessment - 01/11/16 1301    Subjective Last night had pain in low back that was rated as 10/10 and was standing longer for fixing supper. Today rated as not as bad for low back.   Pertinent History OP, T12 kyphoplasty 2/16; compresson fx T12 and L1 2015, L calcaneal ORIF 10/15   Diagnostic tests xrays, MRI  Sept 2015   Patient Stated Goals to get out of pain   Currently in Pain? Yes   Pain Score 5    Pain Location Back  Desribed as whole back but mostly low back   Pain Descriptors / Indicators Burning;Throbbing   Pain Type Acute pain   Pain Onset More than a month ago            [facility] PT Assessment - 01/11/16 0001    Assessment   Medical Diagnosis cervicalgia; lumbar stenosis   Onset Date/Surgical Date 10/14/14   Next MD Visit 01/13/16                     [facility]  Adult PT Treatment/Exercise - 01/11/16 0001    Modalities   Modalities Electrical Stimulation;Moist Heat;Ultrasound   Moist Heat Therapy   Number Minutes Moist Heat 15 Minutes   Moist Heat Location Lumbar Spine   Electrical Stimulation   Electrical Stimulation Location B low back/ UT   Electrical Stimulation Action Pre-Mod  2 channels   Electrical Stimulation Parameters 80-150 Hz x15 min in L sidelying   Electrical Stimulation Goals Pain   Ultrasound   Ultrasound Location B lumbar paraspinals   Ultrasound Parameters 1.5 w/cm2, 100%,1 mhz x10 min   Ultrasound Goals Pain   Manual Therapy   Manual Therapy Soft tissue mobilization   Soft tissue mobilization Light STW to B lumbar paraspinals/ posterior hip musculature/ UT/ Levator Scapula/ cervical paraspinals in L sidelying to  decrease tightness and pain                  PT Short Term Goals - 12/12/15 1223    PT SHORT TERM GOAL #1   Title I with initial HEP (01/09/16)   Time 4   Period Weeks   Status New   PT SHORT TERM GOAL #2   Title decreased pain to 5/10 overall (01/09/16)   Time 4   Period Weeks   Status New   PT SHORT TERM GOAL #3   Title improved cervical rotation by 20 degrees B (01/09/16)   Time 4   Period Weeks   Status New           PT Long Term Goals - 12/12/15 1226    PT LONG TERM GOAL #1   Title I with advanced HEP   Time 8   Period Weeks   Status New   PT LONG TERM GOAL #2   Title Decreased pain overall by 50% since evaluation with ADLS   Time 8   Period Weeks   Status New   PT LONG TERM GOAL #3   Title Patient able to stand to do ADLs for 30 min before needing to sit.   Time 8   Period Weeks   Status New   PT LONG TERM GOAL #4   Title Patient able to walk for 30 min before needing stop for pain   Time 8   Period Weeks   Status New               Plan - 01/11/16 1338    Clinical Impression Statement Patient tolerated today's treatment fairly well although she could not find comfortable position that would not increase pain and also reported sensitivity during manual therapy. Normal modalities response noted following removal of the modalities. Patient noted increased sensitivity in B lumbar paraspinals and into B posterior hip musculature with greater tightness noted in the R lumbar musculature and posterior hip musculature. Patient also wished for STW to be complete to cervical musculature as well but no abnormal tightness was noted during STW to cervical musculature. Patient noted that she does not sleep well due to pain and cannot lay on her back due to the pain. Goals remain on-going at this time secondary to the pain experienced by the patient at this time.   Pt will benefit from skilled therapeutic intervention in order to improve on the following  deficits Decreased range of motion;Decreased activity tolerance;Pain   Rehab Potential Good   PT Frequency 2x / week   PT Duration 8 weeks   PT Treatment/Interventions ADLs/Self Care Home Management;Electrical Stimulation;Cryotherapy;Moist Heat;Therapeutic exercise;Manual techniques;Patient/family education;Neuromuscular re-education;Ultrasound;Passive range of  motion;Dry needling   PT Next Visit Plan continue modalities for pain, cervical ROM, STW to neck/back as tolerated, progress to spinal stabilization TE.   PT Home Exercise Plan cervical ROM   Consulted and Agree with Plan of Care Patient        Problem List Patient Active Problem List   Diagnosis Date Noted  . Chronic hepatitis C virus infection ([place]) 12/15/2015  . Allergic rhinitis 12/09/2015  . Insomnia 12/09/2015  . Genital herpes 12/09/2015  . Hyperlipidemia 12/09/2015  . Menopausal symptom 12/09/2015  . Compression fracture of body of thoracic vertebra 02/17/2015  . Lumbar compression fracture, L1-2 endplate  624THL  . Thoracic compression fracture, T12 endplate  624THL  . Fracture of metatarsal bone of right foot, 4th neck 10/22/2014  . Has jumped from building 10/22/2014  . Bipolar 1 disorder ([place])   . Schizoaffective disorder ([place])   . Calcaneal fracture, Left  10/19/2014    Wynelle Fanny, PTA 01/11/2016, 2:28 PM  [facility] [place], [place], [address] Phone: [phone]   Fax:  [phone]  Name: Charlaine Crusoe MRN: [id number] Date of Birth: [birth year]

## 2016-01-12 ENCOUNTER — Ambulatory Visit: Payer: Medicare Other | Admitting: *Deleted

## 2016-01-12 DIAGNOSIS — F319 Bipolar disorder, unspecified: Secondary | ICD-10-CM | POA: Diagnosis not present

## 2016-01-12 DIAGNOSIS — M2560 Stiffness of unspecified joint, not elsewhere classified: Secondary | ICD-10-CM

## 2016-01-12 DIAGNOSIS — M545 Low back pain, unspecified: Secondary | ICD-10-CM

## 2016-01-12 DIAGNOSIS — M542 Cervicalgia: Secondary | ICD-10-CM | POA: Diagnosis not present

## 2016-01-12 DIAGNOSIS — R6889 Other general symptoms and signs: Secondary | ICD-10-CM | POA: Diagnosis not present

## 2016-01-12 DIAGNOSIS — R262 Difficulty in walking, not elsewhere classified: Secondary | ICD-10-CM | POA: Diagnosis not present

## 2016-01-12 DIAGNOSIS — R293 Abnormal posture: Secondary | ICD-10-CM

## 2016-01-12 NOTE — Therapy (Signed)
[facility] [place], [place], [address] Phone: [phone]   Fax:  [phone]  Physical Therapy Treatment  Patient Details  Name: Beryl Blucher MRN: [id number] Date of Birth: December 07, 1966 Referring Provider: Claretta Fraise MD  Encounter Date: 01/12/2016      PT End of Session - 01/12/16 1348    Visit Number 5   Number of Visits 16   Date for PT Re-Evaluation 02/06/16   PT Start Time 1311   PT Stop Time 1403   PT Time Calculation (min) 52 min      Past Medical History  Diagnosis Date  . Bipolar 1 disorder ([place])   . Schizoaffective disorder ([place])   . Post traumatic stress disorder (PTSD)   . Calcaneal fracture, Left  10/19/2014  . Thoracic compression fracture, T12 endplate  624THL  . Lumbar compression fracture, L1-2 endplate  624THL  . Fracture of metatarsal bone of right foot, 4th neck 10/22/2014  . Back pain, chronic   . GERD (gastroesophageal reflux disease)   . Arthritis   . Headache     hx migraines, tension headaches  . Hepatitis     stated in the past - pt denies this as of 07/28/15)    Past Surgical History  Procedure Laterality Date  . Tubal ligation    . Orif calcaneous fracture Left 10/21/2014    Procedure: OPEN REDUCTION INTERNAL FIXATION (ORIF) LEFT CALCANEOUS FRACTURE;  Surgeon: Rozanna Box, MD;  Location: [facility];  Service: Orthopedics;  Laterality: Left;  Marland Kitchen Kyphoplasty N/A 02/17/2015    Procedure: T12 KYPHOPLASTY;  Surgeon: Melina Schools, MD;  Location: [facility];  Service: Orthopedics;  Laterality: N/A;  . Colonoscopy    . Hardware removal Left 07/29/2015    Procedure: HARDWARE REMOVAL,LEFT HEEL;  Surgeon: Altamese [place], MD;  Location: [facility];  Service: Orthopedics;  Laterality: Left;  . Steriod injection Right 07/29/2015    Procedure: Subtalar Joint injection with Fluoro guidance ;  Surgeon: Altamese [place], MD;  Location: [facility];  Service: Orthopedics;  Laterality: Right;    There were no  vitals filed for this visit.  Visit Diagnosis:  Bilateral low back pain without sciatica  Neck pain  Stiffness of joints, multiple sites  Activity intolerance  Abnormal posture  Difficulty walking  Joint stiffness of spine      Subjective Assessment - 01/12/16 1316    Subjective was very sore after last Rx. I can't sleep weel because of pain. Focus on my LB today.   Pertinent History OP, T12 kyphoplasty 2/16; compresson fx T12 and L1 2015, L calcaneal ORIF 10/15   Diagnostic tests xrays, MRI  Sept 2015   Patient Stated Goals to get out of pain   Currently in Pain? Yes   Pain Score 7    Pain Location Back   Pain Orientation Right;Left;Lower   Pain Descriptors / Indicators Burning;Throbbing   Pain Type Acute pain   Pain Onset More than a month ago   Pain Frequency Constant   Aggravating Factors  pressure and moving   Pain Relieving Factors heat   Pain Orientation Right;Left;Mid   Pain Descriptors / Indicators Burning   Pain Type Chronic pain   Pain Onset More than a month ago   Pain Frequency Constant                         [facility] Adult PT Treatment/Exercise - 01/12/16 0001    Modalities   Modalities  Electrical Stimulation;Moist Heat;Ultrasound   Moist Heat Therapy   Number Minutes Moist Heat 15 Minutes   Moist Heat Location Lumbar Spine   Electrical Stimulation   Electrical Stimulation Location B low back/ UT premod to mid-back and LB paras in LT sidelying   Electrical Stimulation Goals Pain   Ultrasound   Ultrasound Location BIL thoracolumbar paras @ 1.5 w/cm2 x 10 mins   Ultrasound Goals Pain   Manual Therapy   Manual Therapy Soft tissue mobilization   Soft tissue mobilization Light STW to B Thoracolumbar  paraspinals/ and BW Scapulae in prone                  PT Short Term Goals - 12/12/15 1223    PT SHORT TERM GOAL #1   Title I with initial HEP (01/09/16)   Time 4   Period Weeks   Status New   PT SHORT TERM GOAL #2   Title  decreased pain to 5/10 overall (01/09/16)   Time 4   Period Weeks   Status New   PT SHORT TERM GOAL #3   Title improved cervical rotation by 20 degrees B (01/09/16)   Time 4   Period Weeks   Status New           PT Long Term Goals - 12/12/15 1226    PT LONG TERM GOAL #1   Title I with advanced HEP   Time 8   Period Weeks   Status New   PT LONG TERM GOAL #2   Title Decreased pain overall by 50% since evaluation with ADLS   Time 8   Period Weeks   Status New   PT LONG TERM GOAL #3   Title Patient able to stand to do ADLs for 30 min before needing to sit.   Time 8   Period Weeks   Status New   PT LONG TERM GOAL #4   Title Patient able to walk for 30 min before needing stop for pain   Time 8   Period Weeks   Status New               Plan - 01/12/16 1349    Clinical Impression Statement Pt did fair today with Rx, but continues to complain of how sensitive her LB and midback are during the RX. She has notable tightness and soreness from Lumbar up to cervical paras. premod intensity had to be reduced during the  Rx due to  sensitivity.   Pt will benefit from skilled therapeutic intervention in order to improve on the following deficits Decreased range of motion;Decreased activity tolerance;Pain   Rehab Potential Good   PT Frequency 2x / week   PT Duration 8 weeks   PT Treatment/Interventions ADLs/Self Care Home Management;Electrical Stimulation;Cryotherapy;Moist Heat;Therapeutic exercise;Manual techniques;Patient/family education;Neuromuscular re-education;Ultrasound;Passive range of motion;Dry needling   PT Next Visit Plan continue modalities for pain, cervical ROM, STW to neck/back as tolerated, progress to spinal stabilization TE.   PT Home Exercise Plan cervical ROM   Consulted and Agree with Plan of Care Patient        Problem List Patient Active Problem List   Diagnosis Date Noted  . Chronic hepatitis C virus infection ([place]) 12/15/2015  . Allergic  rhinitis 12/09/2015  . Insomnia 12/09/2015  . Genital herpes 12/09/2015  . Hyperlipidemia 12/09/2015  . Menopausal symptom 12/09/2015  . Compression fracture of body of thoracic vertebra 02/17/2015  . Lumbar compression fracture, L1-2 endplate  624THL  . Thoracic compression  fracture, T12 endplate  624THL  . Fracture of metatarsal bone of right foot, 4th neck 10/22/2014  . Has jumped from building 10/22/2014  . Bipolar 1 disorder ([place])   . Schizoaffective disorder ([place])   . Calcaneal fracture, Left  10/19/2014    Valissa Lyvers,CHRIS, PTA 01/12/2016, 2:03 PM  [facility] [place], [place], [address] Phone: [phone]   Fax:  [phone]  Name: Ortencia Yon MRN: [id number] Date of Birth: 04/10/1966

## 2016-01-13 ENCOUNTER — Encounter: Payer: Medicare Other | Admitting: Family Medicine

## 2016-01-13 ENCOUNTER — Ambulatory Visit (INDEPENDENT_AMBULATORY_CARE_PROVIDER_SITE_OTHER): Payer: Medicare Other | Admitting: Family Medicine

## 2016-01-13 ENCOUNTER — Encounter: Payer: Self-pay | Admitting: Family Medicine

## 2016-01-13 VITALS — BP 115/78 | HR 90 | Temp 97.2°F | Ht 63.5 in | Wt 186.4 lb

## 2016-01-13 DIAGNOSIS — E559 Vitamin D deficiency, unspecified: Secondary | ICD-10-CM | POA: Diagnosis not present

## 2016-01-13 DIAGNOSIS — R894 Abnormal immunological findings in specimens from other organs, systems and tissues: Secondary | ICD-10-CM | POA: Diagnosis not present

## 2016-01-13 DIAGNOSIS — B182 Chronic viral hepatitis C: Secondary | ICD-10-CM

## 2016-01-13 DIAGNOSIS — E785 Hyperlipidemia, unspecified: Secondary | ICD-10-CM

## 2016-01-13 DIAGNOSIS — I868 Varicose veins of other specified sites: Secondary | ICD-10-CM

## 2016-01-13 DIAGNOSIS — R768 Other specified abnormal immunological findings in serum: Secondary | ICD-10-CM

## 2016-01-13 DIAGNOSIS — F319 Bipolar disorder, unspecified: Secondary | ICD-10-CM

## 2016-01-13 DIAGNOSIS — N760 Acute vaginitis: Secondary | ICD-10-CM | POA: Diagnosis not present

## 2016-01-13 DIAGNOSIS — I83899 Varicose veins of unspecified lower extremities with other complications: Secondary | ICD-10-CM | POA: Insufficient documentation

## 2016-01-13 DIAGNOSIS — M545 Low back pain, unspecified: Secondary | ICD-10-CM

## 2016-01-13 DIAGNOSIS — I8390 Asymptomatic varicose veins of unspecified lower extremity: Secondary | ICD-10-CM

## 2016-01-13 LAB — POCT WET PREP WITH KOH
KOH Prep POC: POSITIVE
Trichomonas, UA: NEGATIVE
YEAST WET PREP PER HPF POC: NEGATIVE

## 2016-01-13 LAB — POCT URINALYSIS DIPSTICK
Bilirubin, UA: NEGATIVE
Glucose, UA: NEGATIVE
Ketones, UA: NEGATIVE
LEUKOCYTES UA: NEGATIVE
PH UA: 5
PROTEIN UA: NEGATIVE
RBC UA: NEGATIVE
Spec Grav, UA: 1.02
Urobilinogen, UA: NEGATIVE

## 2016-01-13 MED ORDER — MOMETASONE FUROATE 50 MCG/ACT NA SUSP: 2.0000 | Freq: Every day | NASAL | Status: DC

## 2016-01-13 MED ORDER — METHOCARBAMOL 500 MG PO TABS: 500.0000 mg | ORAL_TABLET | Freq: Four times a day (QID) | ORAL | Status: DC
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
## 2016-01-14 LAB — TSH+FREE T4
FREE T4: 0.89 ng/dL (ref 0.82–1.77)
TSH: 0.488 u[IU]/mL (ref 0.450–4.500)

## 2016-01-14 LAB — CBC WITH DIFFERENTIAL/PLATELET
BASOS ABS: 0 10*3/uL (ref 0.0–0.2)
Basos: 0 %
EOS (ABSOLUTE): 0.1 10*3/uL (ref 0.0–0.4)
Eos: 1 %
Hematocrit: 39.7 % (ref 34.0–46.6)
Hemoglobin: 13.4 g/dL (ref 11.1–15.9)
Immature Grans (Abs): 0 10*3/uL (ref 0.0–0.1)
Immature Granulocytes: 0 %
LYMPHS ABS: 3.6 10*3/uL — AB (ref 0.7–3.1)
Lymphs: 49 %
MCH: 30.5 pg (ref 26.6–33.0)
MCHC: 33.8 g/dL (ref 31.5–35.7)
MCV: 90 fL (ref 79–97)
MONOS ABS: 0.7 10*3/uL (ref 0.1–0.9)
Monocytes: 9 %
Neutrophils Absolute: 3.1 10*3/uL (ref 1.4–7.0)
Neutrophils: 41 %
Platelets: 241 10*3/uL (ref 150–379)
RBC: 4.4 x10E6/uL (ref 3.77–5.28)
RDW: 13.8 % (ref 12.3–15.4)
WBC: 7.4 10*3/uL (ref 3.4–10.8)

## 2016-01-14 LAB — CMP14+EGFR
ALK PHOS: 67 IU/L (ref 39–117)
ALT: 287 IU/L — ABNORMAL HIGH (ref 0–32)
AST: 141 IU/L — AB (ref 0–40)
Albumin/Globulin Ratio: 1.5 (ref 1.1–2.5)
Albumin: 4.2 g/dL (ref 3.5–5.5)
BILIRUBIN TOTAL: 0.4 mg/dL (ref 0.0–1.2)
BUN / CREAT RATIO: 7 — AB (ref 9–23)
BUN: 7 mg/dL (ref 6–24)
CHLORIDE: 104 mmol/L (ref 96–106)
CO2: 24 mmol/L (ref 18–29)
CREATININE: 0.99 mg/dL (ref 0.57–1.00)
Calcium: 9.3 mg/dL (ref 8.7–10.2)
GFR calc Af Amer: 76 mL/min/{1.73_m2} (ref >59)
GFR calc non Af Amer: 66 mL/min/{1.73_m2} (ref >59)
GLOBULIN, TOTAL: 2.8 g/dL (ref 1.5–4.5)
GLUCOSE: 94 mg/dL (ref 65–99)
POTASSIUM: 4.7 mmol/L (ref 3.5–5.2)
SODIUM: 144 mmol/L (ref 134–144)
Total Protein: 7 g/dL (ref 6.0–8.5)

## 2016-01-14 LAB — LIPID PANEL
CHOLESTEROL TOTAL: 159 mg/dL (ref 100–199)
Chol/HDL Ratio: 1.8 ratio units (ref 0.0–4.4)
HDL: 86 mg/dL (ref >39)
LDL Calculated: 61 mg/dL (ref 0–99)
Triglycerides: 59 mg/dL (ref 0–149)
VLDL Cholesterol Cal: 12 mg/dL (ref 5–40)

## 2016-01-17 ENCOUNTER — Ambulatory Visit: Payer: Medicare Other | Admitting: *Deleted

## 2016-01-17 DIAGNOSIS — R262 Difficulty in walking, not elsewhere classified: Secondary | ICD-10-CM

## 2016-01-17 DIAGNOSIS — R6889 Other general symptoms and signs: Secondary | ICD-10-CM

## 2016-01-17 DIAGNOSIS — M542 Cervicalgia: Secondary | ICD-10-CM

## 2016-01-17 DIAGNOSIS — R293 Abnormal posture: Secondary | ICD-10-CM | POA: Diagnosis not present

## 2016-01-17 DIAGNOSIS — M2560 Stiffness of unspecified joint, not elsewhere classified: Secondary | ICD-10-CM

## 2016-01-17 DIAGNOSIS — M545 Low back pain, unspecified: Secondary | ICD-10-CM
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
## 2016-01-19 ENCOUNTER — Telehealth: Payer: Self-pay

## 2016-01-19 ENCOUNTER — Ambulatory Visit: Payer: Medicare Other | Admitting: Physical Therapy

## 2016-01-19 DIAGNOSIS — M545 Low back pain, unspecified: Secondary | ICD-10-CM

## 2016-01-19 DIAGNOSIS — R6889 Other general symptoms and signs: Secondary | ICD-10-CM

## 2016-01-19 DIAGNOSIS — R293 Abnormal posture: Secondary | ICD-10-CM | POA: Diagnosis not present

## 2016-01-19 DIAGNOSIS — M542 Cervicalgia: Secondary | ICD-10-CM | POA: Diagnosis not present

## 2016-01-19 DIAGNOSIS — M2560 Stiffness of unspecified joint, not elsewhere classified: Secondary | ICD-10-CM

## 2016-01-19 DIAGNOSIS — R262 Difficulty in walking, not elsewhere classified: Secondary | ICD-10-CM | POA: Diagnosis not present

## 2016-01-19 MED ORDER — FLUNISOLIDE 25 MCG/ACT (0.025%) NA SOLN: 2.0000 | Freq: Every day | NASAL | Status: DC

## 2016-01-19 NOTE — Therapy (Signed)
[facility] [place], [place], [address] Phone: [phone]   Fax:  [phone]  Physical Therapy Treatment  Patient Details  Name: Lauren Fuentes MRN: [id number] Date of Birth: [birth year] Referring Provider: Claretta Fraise MD  Encounter Date: 01/19/2016      PT End of Session - 01/19/16 0947    Visit Number 7   Number of Visits 16   Date for PT Re-Evaluation 02/06/16   PT Start Time 0947   PT Stop Time 1047   PT Time Calculation (min) 60 min   Activity Tolerance Patient tolerated treatment well   Behavior During Therapy [facility] for tasks assessed/performed      Past Medical History  Diagnosis Date  . Bipolar 1 disorder ([place])   . Schizoaffective disorder ([place])   . Post traumatic stress disorder (PTSD)   . Calcaneal fracture, Left  10/19/2014  . Thoracic compression fracture, T12 endplate  624THL  . Lumbar compression fracture, L1-2 endplate  624THL  . Fracture of metatarsal bone of right foot, 4th neck 10/22/2014  . Back pain, chronic   . GERD (gastroesophageal reflux disease)   . Arthritis   . Headache     hx migraines, tension headaches  . Hepatitis     stated in the past - pt denies this as of 07/28/15)    Past Surgical History  Procedure Laterality Date  . Tubal ligation    . Orif calcaneous fracture Left 10/21/2014    Procedure: OPEN REDUCTION INTERNAL FIXATION (ORIF) LEFT CALCANEOUS FRACTURE;  Surgeon: Rozanna Box, MD;  Location: [place];  Service: Orthopedics;  Laterality: Left;  Marland Kitchen Kyphoplasty N/A 02/17/2015    Procedure: T12 KYPHOPLASTY;  Surgeon: Melina Schools, MD;  Location: [place];  Service: Orthopedics;  Laterality: N/A;  . Colonoscopy    . Hardware removal Left 07/29/2015    Procedure: HARDWARE REMOVAL,LEFT HEEL;  Surgeon: Altamese Hooper, MD;  Location: [place];  Service: Orthopedics;  Laterality: Left;  . Steriod injection Right 07/29/2015    Procedure: Subtalar Joint injection with Fluoro guidance  ;  Surgeon: Altamese [place], MD;  Location: [place];  Service: Orthopedics;  Laterality: Right;    There were no vitals filed for this visit.  Visit Diagnosis:  Bilateral low back pain without sciatica  Neck pain  Stiffness of joints, multiple sites  Activity intolerance      Subjective Assessment - 01/19/16 0949    Subjective Patient states she feels "hyper" today. Sometimes I am more sore after therapy and sometimes I feel better. My neck stays sore.   Pertinent History OP, T12 kyphoplasty 2/16; compresson fx T12 and L1 2015, L calcaneal ORIF 10/15   Diagnostic tests xrays, MRI  Sept 2015   Patient Stated Goals to get out of pain   Currently in Pain? Yes   Pain Score 5    Pain Location Back   Pain Orientation Right;Left;Lower   Pain Descriptors / Indicators Burning;Aching   Pain Type Acute pain   Pain Onset More than a month ago   Pain Frequency Constant   Aggravating Factors  pressure and moving   Pain Relieving Factors heat   Effect of Pain on Daily Activities must wear brace   Multiple Pain Sites Yes   Pain Score 4   Pain Location Back   Pain Orientation Right;Left;Mid   Pain Descriptors / Indicators Burning   Pain Type Chronic pain   Pain Radiating Towards up to thoracic pain   Pain Onset More than  a month ago   Pain Frequency Constant   Aggravating Factors  supine positioning   Pain Relieving Factors heat            [facility] PT Assessment - 01/19/16 0001    ROM / Strength   AROM / PROM / Strength AROM   AROM   AROM Assessment Site Cervical   Cervical - Right Rotation 47   Cervical - Left Rotation 39                     [facility] Adult PT Treatment/Exercise - 01/19/16 0001    Self-Care   Self-Care Other Self-Care Comments   Other Self-Care Comments  Discussed desensitization technique using varying fabrics; also discussed Dry needling and potential benefits for decreasing pain vs. modalities.   Lexicographer B UT to B Lumbar paraspinals   Electrical Stimulation Action IFC   Electrical Stimulation Parameters 80-150 Hz to tolerance x 15 min   Electrical Stimulation Goals Pain   Ultrasound   Ultrasound Location L UT, rhomboids, thoracic and lumbar paraspinals and L QL   Ultrasound Parameters 1.5 wcm 1 mhz cont x 15 min   Ultrasound Goals Pain   Manual Therapy   Manual Therapy Soft tissue mobilization   Soft tissue mobilization Light STW to B Thoracolumbar  paraspinals                PT Education - 01/19/16 1103    Education provided Yes   Education Details HEP   Person(s) Educated Patient   Methods Explanation;Demonstration;Handout   Comprehension Verbalized understanding;Returned demonstration          PT Short Term Goals - 01/19/16 1105    PT SHORT TERM GOAL #1   Title I with initial HEP (01/09/16)   Time 4   Period Weeks   Status Achieved   PT SHORT TERM GOAL #2   Title decreased pain to 5/10 overall (01/09/16)   Time 4   Period Weeks   Status On-going   PT SHORT TERM GOAL #3   Title improved cervical rotation by 20 degrees B (01/09/16)   Time 4   Period Weeks   Status On-going           PT Long Term Goals - 01/19/16 1106    PT LONG TERM GOAL #1   Title I with advanced HEP   Time 8   Period Weeks   Status On-going   PT LONG TERM GOAL #2   Title Decreased pain overall by 50% since evaluation with ADLS   Time 8   Period Weeks   Status On-going   PT LONG TERM GOAL #3   Title Patient able to stand to do ADLs for 30 min before needing to sit.   Time 8   Period Weeks   Status On-going   PT LONG TERM GOAL #4   Title Patient able to walk for 30 min before needing stop for pain   Period Weeks   Status On-going               Plan - 01/19/16 1104    Clinical Impression Statement Patient tolerated light STW, but does not respond well to even moderate pressure. Skin desensitization technique and dry needling discussed with patient. Patient is  very interested in DN and has scheduled at [facility] clinic  (DN not available in [place] clinic at this time) She has active TPs in B UTs, B thoracic  paraspinals. Difficult to assess lumbar as patient so hypersensitive. Her cervical ROM to the L has increased since eval. All other goals are ongoing.   Pt will benefit from skilled therapeutic intervention in order to improve on the following deficits Decreased range of motion;Decreased activity tolerance;Pain   Rehab Potential Good   PT Frequency 2x / week   PT Duration 8 weeks   PT Treatment/Interventions ADLs/Self Care Home Management;Electrical Stimulation;Cryotherapy;Moist Heat;Therapeutic exercise;Manual techniques;Patient/family education;Neuromuscular re-education;Ultrasound;Passive range of motion;Dry needling   PT Next Visit Plan DN, continue STW as tolerated, progress to spinal stabilization.   PT Home Exercise Plan cervical tension stretches   Consulted and Agree with Plan of Care Patient        Problem List Patient Active Problem List   Diagnosis Date Noted  . Varicose veins 01/13/2016  . Chronic hepatitis C virus infection ([place]) 12/15/2015  . Allergic rhinitis 12/09/2015  . Insomnia 12/09/2015  . Genital herpes 12/09/2015  . Hyperlipidemia 12/09/2015  . Menopausal symptom 12/09/2015  . Compression fracture of body of thoracic vertebra 02/17/2015  . Lumbar compression fracture, L1-2 endplate  624THL  . Thoracic compression fracture, T12 endplate  624THL  . Fracture of metatarsal bone of right foot, 4th neck 10/22/2014  . Has jumped from building 10/22/2014  . Bipolar 1 disorder ([place])   . Schizoaffective disorder ([place])   . Calcaneal fracture, Left  10/19/2014    Madelyn Flavors PT  01/19/2016, 11:30 AM  [facility] [address] [place], [place], [address] Phone: [phone]   Fax:  [phone]  Name: Lauren Fuentes MRN: [id number] Date of Birth:  [birth year]

## 2016-01-19 NOTE — Telephone Encounter (Signed)
Flunisolide sent.

## 2016-01-19 NOTE — Patient Instructions (Signed)
  Flexibility: Upper Trapezius Stretch   Gently grasp right side of head while reaching behind back with other hand. Tilt head away until a gentle stretch is felt. Hold 30 seconds. Repeat 3 times per set. Do 2 sessions per day.  http://orth.exer.us/340   Levator Stretch   Grasp seat or sit on hand on side to be stretched. Turn head toward other side and look down. Use hand on head to gently stretch neck in that position. Hold _30___ seconds. Repeat on other side. Repeat 3 times. Do 2 sessions per day.  http://gt2.exer.us/30   Scapular Retraction (Standing)   With arms at sides, pinch shoulder blades together. Repeat 10 times per set. Do 1-3 sets per session. Do 2 sessions per day.  http://orth.exer.us/944     Flexibility: Neck Retraction   Pull head straight back, keeping eyes and jaw level. Hold 3-5 seconds. Repeat _10 times per set. Do 3-5  sessions per day.  http://orth.exer.us/344   Posture - Sitting   Sit upright, head facing forward. Try using a roll to support lower back. Keep shoulders relaxed, and avoid rounded back. Keep hips level with knees. Avoid crossing legs for long periods.  Madelyn Flavors, PT 01/19/2016 10:31 AM [facility] [address] [place], [place], [address] Phone: [phone]   Fax:  [phone]

## 2016-01-19 NOTE — Telephone Encounter (Signed)
Insurance denied Mometasone Furoate spray because its non formulary.  Preferred are flunisolide nasal spray, azelastine, cetirizine oral solution

## 2016-01-20 ENCOUNTER — Other Ambulatory Visit: Payer: Self-pay | Admitting: *Deleted

## 2016-01-20 DIAGNOSIS — I83893 Varicose veins of bilateral lower extremities with other complications: Secondary | ICD-10-CM

## 2016-01-24 ENCOUNTER — Encounter: Payer: Medicare Other | Admitting: Physical Therapy

## 2016-01-26 ENCOUNTER — Ambulatory Visit: Payer: Medicare Other | Admitting: Physical Therapy

## 2016-01-26 ENCOUNTER — Encounter: Payer: Medicare Other | Admitting: Physical Therapy

## 2016-01-30 ENCOUNTER — Encounter: Payer: Medicare Other | Admitting: Physical Therapy

## 2016-01-31 ENCOUNTER — Ambulatory Visit: Payer: Medicare Other | Attending: Family Medicine | Admitting: Physical Therapy

## 2016-01-31 DIAGNOSIS — M542 Cervicalgia: Secondary | ICD-10-CM | POA: Insufficient documentation

## 2016-01-31 DIAGNOSIS — R6889 Other general symptoms and signs: Secondary | ICD-10-CM

## 2016-01-31 DIAGNOSIS — R262 Difficulty in walking, not elsewhere classified: Secondary | ICD-10-CM | POA: Insufficient documentation

## 2016-01-31 DIAGNOSIS — R293 Abnormal posture: Secondary | ICD-10-CM | POA: Insufficient documentation

## 2016-01-31 DIAGNOSIS — M2560 Stiffness of unspecified joint, not elsewhere classified: Secondary | ICD-10-CM | POA: Diagnosis not present

## 2016-01-31 DIAGNOSIS — M545 Low back pain, unspecified: Secondary | ICD-10-CM

## 2016-01-31 NOTE — Patient Instructions (Signed)

## 2016-01-31 NOTE — Therapy (Signed)
[facility] [address], [place], [place], [address] Phone: [phone]   Fax:  [phone]  Physical Therapy Treatment  Patient Details  Name: Lauren Fuentes MRN: [id number] Date of Birth: [birth year] Referring Provider: Claretta Fraise MD  Encounter Date: 01/31/2016      PT End of Session - 01/31/16 1517    Visit Number 8   Number of Visits 16   Date for PT Re-Evaluation 02/06/16   Authorization Type G code at visit 10;    PT Start Time 0800   PT Stop Time 0850   PT Time Calculation (min) 50 min   Activity Tolerance Patient limited by pain      Past Medical History  Diagnosis Date  . Bipolar 1 disorder ([place])   . Schizoaffective disorder ([place])   . Post traumatic stress disorder (PTSD)   . Calcaneal fracture, Left  10/19/2014  . Thoracic compression fracture, T12 endplate  624THL  . Lumbar compression fracture, L1-2 endplate  624THL  . Fracture of metatarsal bone of right foot, 4th neck 10/22/2014  . Back pain, chronic   . GERD (gastroesophageal reflux disease)   . Arthritis   . Headache     hx migraines, tension headaches  . Hepatitis     stated in the past - pt denies this as of 07/28/15)    Past Surgical History  Procedure Laterality Date  . Tubal ligation    . Orif calcaneous fracture Left 10/21/2014    Procedure: OPEN REDUCTION INTERNAL FIXATION (ORIF) LEFT CALCANEOUS FRACTURE;  Surgeon: Rozanna Box, MD;  Location: [place];  Service: Orthopedics;  Laterality: Left;  Marland Kitchen Kyphoplasty N/A 02/17/2015    Procedure: T12 KYPHOPLASTY;  Surgeon: Melina Schools, MD;  Location: [place];  Service: Orthopedics;  Laterality: N/A;  . Colonoscopy    . Hardware removal Left 07/29/2015    Procedure: HARDWARE REMOVAL,LEFT HEEL;  Surgeon: Altamese [place], MD;  Location: [place];  Service: Orthopedics;  Laterality: Left;  . Steriod injection Right 07/29/2015    Procedure: Subtalar Joint injection with Fluoro guidance ;   Surgeon: Altamese [place], MD;  Location: [place];  Service: Orthopedics;  Laterality: Right;    There were no vitals filed for this visit.  Visit Diagnosis:  Bilateral low back pain without sciatica  Neck pain  Stiffness of joints, multiple sites  Activity intolerance  Abnormal posture  Difficulty walking  Joint stiffness of spine      Subjective Assessment - 01/31/16 0804    Subjective Reports LBP and neck pain/upper trap and between shoulder blades pain.  Limited in walking time.  I can only do a little cleaning.  Unable to exercise yet.  I don't want to go to the pain clinic.     Currently in Pain? Yes   Pain Score 7    Pain Location Back   Pain Orientation Right;Left   Aggravating Factors  walking, lifting   Pain Score 5   Pain Location Neck                         [facility] Adult PT Treatment/Exercise - 01/31/16 0001    Exercises   Exercises --  attemptedinstruct in supine decompression ex unable lie sup   Moist Heat Therapy   Number Minutes Moist Heat 15 Minutes   Moist Heat Location Cervical;Lumbar Spine   Electrical Stimulation   Electrical Stimulation Location B interscapular region and bilateral lumbar paraspinals   Electrical  Stimulation Action IFC   Electrical Stimulation Parameters 5 ma   Electrical Stimulation Goals Pain   Manual Therapy   Soft tissue mobilization light soft tissue to bilateral lumbar paraspinals, rhomboids and upper traps          Trigger Point Dry Needling - 01/31/16 1516    Consent Given? Yes   Education Handout Provided Yes   Muscles Treated Upper Body Upper trapezius;Levator scapulae;Rhomboids   Muscles Treated Lower Body --  Bilateral lumbar multfidi   Upper Trapezius Response Twitch reponse elicited;Palpable increased muscle length   Levator Scapulae Response Twitch response elicited;Palpable increased muscle length   Rhomboids Response Palpable increased muscle length              PT Education -  01/31/16 1516    Education provided Yes   Education Details dry needling after care; decompression ex   Person(s) Educated Patient   Methods Explanation;Handout   Comprehension Verbalized understanding          PT Short Term Goals - 01/31/16 1523    PT SHORT TERM GOAL #1   Title I with initial HEP (01/09/16)   Status Achieved   PT SHORT TERM GOAL #2   Title decreased pain to 5/10 overall (01/09/16)   Time 4   Period Weeks   Status On-going   PT SHORT TERM GOAL #3   Title improved cervical rotation by 20 degrees B (01/09/16)   Time 4   Period Weeks   Status On-going           PT Long Term Goals - 01/31/16 1524    PT LONG TERM GOAL #1   Title I with advanced HEP   Time 8   Period Weeks   Status On-going   PT LONG TERM GOAL #2   Title Decreased pain overall by 50% since evaluation with ADLS   Time 8   Period Weeks   Status On-going   PT LONG TERM GOAL #3   Title Patient able to stand to do ADLs for 30 min before needing to sit.   Time 8   Period Weeks   Status On-going   PT LONG TERM GOAL #4   Title Patient able to walk for 30 min before needing stop for pain   Time 8   Period Weeks   Status On-going               Plan - 01/31/16 1518    Clinical Impression Statement The patient complains of severe pain in all positions and declines even attempting supine position.  She complains of pain extending the entire length of her spine which has not responded to previous interventions.  She requests dry needling followed by manual therapy to increase soft tissue length.  She is sensitive to treatment with multiple twitch responses produced.  [place] electrical stimulation she has difficlty tolerating sidelying and prone and withstanding the weight of the hot pack.  Will reasses progress toward goals with recertification due [id number].     PT Next Visit Plan assess response to 1st DN; try seated spinal stabilization and isometrics;  try e-stim/heat in sitting;  recert due  [id number]        Problem List Patient Active Problem List   Diagnosis Date Noted  . Varicose veins 01/13/2016  . Chronic hepatitis C virus infection ([place]) 12/15/2015  . Allergic rhinitis 12/09/2015  . Insomnia 12/09/2015  . Genital herpes 12/09/2015  . Hyperlipidemia 12/09/2015  . Menopausal symptom 12/09/2015  .  Compression fracture of body of thoracic vertebra 02/17/2015  . Lumbar compression fracture, L1-2 endplate  624THL  . Thoracic compression fracture, T12 endplate  624THL  . Fracture of metatarsal bone of right foot, 4th neck 10/22/2014  . Has jumped from building 10/22/2014  . Bipolar 1 disorder ([place])   . Schizoaffective disorder ([place])   . Calcaneal fracture, Left  10/19/2014    Alvera Singh 01/31/2016, 3:25 PM  [facility] [address] [place], [place], [address] Phone: [phone]   Fax:  [phone]  Name: Lauren Fuentes MRN: [id number] Date of Birth: 01/09/1966    Ruben Im, PT 01/31/2016 3:25 PM Phone: [phone] Fax: [phone]

## 2016-02-01 ENCOUNTER — Encounter: Payer: Medicare Other | Admitting: Physical Therapy

## 2016-02-02 ENCOUNTER — Ambulatory Visit: Payer: Medicare Other | Admitting: Physical Therapy

## 2016-02-07 ENCOUNTER — Ambulatory Visit: Payer: Medicare Other | Admitting: Physical Therapy

## 2016-02-07 DIAGNOSIS — R293 Abnormal posture: Secondary | ICD-10-CM

## 2016-02-07 DIAGNOSIS — M545 Low back pain, unspecified: Secondary | ICD-10-CM

## 2016-02-07 DIAGNOSIS — M2560 Stiffness of unspecified joint, not elsewhere classified: Secondary | ICD-10-CM

## 2016-02-07 DIAGNOSIS — R6889 Other general symptoms and signs: Secondary | ICD-10-CM | POA: Diagnosis not present

## 2016-02-07 DIAGNOSIS — M542 Cervicalgia: Secondary | ICD-10-CM

## 2016-02-07 DIAGNOSIS — R262 Difficulty in walking, not elsewhere classified: Secondary | ICD-10-CM | POA: Diagnosis not present

## 2016-02-07 NOTE — Therapy (Signed)
[facility] [address], [place], [place], [address] Phone: [phone]   Fax:  [phone]  Physical Therapy Treatment  Patient Details  Name: Lauren Fuentes MRN: [id number] Date of Birth: 02/04/1966 Referring Provider: Claretta Fraise MD  Encounter Date: 02/07/2016      PT End of Session - 02/07/16 0925    Visit Number 9   Number of Visits 20   Date for PT Re-Evaluation 04/03/16   PT Start Time 0800   PT Stop Time 0900   PT Time Calculation (min) 60 min   Activity Tolerance Patient limited by pain      Past Medical History  Diagnosis Date  . Bipolar 1 disorder ([place])   . Schizoaffective disorder ([place])   . Post traumatic stress disorder (PTSD)   . Calcaneal fracture, Left  10/19/2014  . Thoracic compression fracture, T12 endplate  624THL  . Lumbar compression fracture, L1-2 endplate  624THL  . Fracture of metatarsal bone of right foot, 4th neck 10/22/2014  . Back pain, chronic   . GERD (gastroesophageal reflux disease)   . Arthritis   . Headache     hx migraines, tension headaches  . Hepatitis     stated in the past - pt denies this as of 07/28/15)    Past Surgical History  Procedure Laterality Date  . Tubal ligation    . Orif calcaneous fracture Left 10/21/2014    Procedure: OPEN REDUCTION INTERNAL FIXATION (ORIF) LEFT CALCANEOUS FRACTURE;  Surgeon: Rozanna Box, MD;  Location: [place];  Service: Orthopedics;  Laterality: Left;  Marland Kitchen Kyphoplasty N/A 02/17/2015    Procedure: T12 KYPHOPLASTY;  Surgeon: Melina Schools, MD;  Location: [place];  Service: Orthopedics;  Laterality: N/A;  . Colonoscopy    . Hardware removal Left 07/29/2015    Procedure: HARDWARE REMOVAL,LEFT HEEL;  Surgeon: Altamese [place], MD;  Location: [place];  Service: Orthopedics;  Laterality: Left;  . Steriod injection Right 07/29/2015    Procedure: Subtalar Joint injection with Fluoro guidance ;  Surgeon: Altamese [place], MD;  Location: [place];   Service: Orthopedics;  Laterality: Right;    There were no vitals filed for this visit.  Visit Diagnosis:  Bilateral low back pain without sciatica - Plan: PT PLAN OF CARE CERT/RE-CERT  Neck pain - Plan: PT PLAN OF CARE CERT/RE-CERT  Stiffness of joints, multiple sites - Plan: PT PLAN OF CARE CERT/RE-CERT  Activity intolerance - Plan: PT PLAN OF CARE CERT/RE-CERT  Abnormal posture - Plan: PT PLAN OF CARE CERT/RE-CERT  Difficulty walking - Plan: PT PLAN OF CARE CERT/RE-CERT  Joint stiffness of spine - Plan: PT PLAN OF CARE CERT/RE-CERT      Subjective Assessment - 02/07/16 0805    Subjective I was sore after the needling but I think it helped.     Currently in Pain? Yes   Pain Score 7    Pain Location Back   Pain Type Chronic pain   Pain Onset More than a month ago   Pain Frequency Constant   Pain Score 7   Pain Location Neck   Pain Onset More than a month ago   Pain Frequency Constant            [facility] PT Assessment - 02/07/16 0001    AROM   AROM Assessment Site Lumbar   Cervical Flexion 15   Cervical - Right Side Bend 25   Cervical - Left Side Bend 20   Cervical - Right Rotation  20   Cervical - Left Rotation 25   Lumbar Flexion 10   Lumbar Extension 5   Lumbar - Right Side Bend 14   Lumbar - Left Side Bend 12                     [facility] Adult PT Treatment/Exercise - 02/07/16 0001    Neck Exercises: Seated   Neck Retraction 5 reps   Neck Retraction Limitations with towel   Cervical Rotation Right;Left;Both   Cervical Rotation Limitations with towel   Other Seated Exercise thoracic extension over ball 8x   Moist Heat Therapy   Number Minutes Moist Heat 15 Minutes   Moist Heat Location Cervical;Lumbar Spine  sitting   Electrical Stimulation   Electrical Stimulation Location B interscapular region and bilateral lumbar paraspinals   Electrical Stimulation Action Pre mod  sitting   Electrical Stimulation Parameters 6 ma   Electrical  Stimulation Goals Pain   Manual Therapy   Soft tissue mobilization light soft tissue to bilateral lumbar paraspinals, rhomboids and upper traps          Trigger Point Dry Needling - 02/07/16 0848    Consent Given? Yes   Muscles Treated Lower Body --  B lumbar multifidi; cervical multifidi   Upper Trapezius Response Twitch reponse elicited;Palpable increased muscle length   Levator Scapulae Response Twitch response elicited;Palpable increased muscle length                PT Short Term Goals - 02/07/16 0807    PT SHORT TERM GOAL #1   Title I with initial HEP (01/09/16)   Status Achieved   PT SHORT TERM GOAL #2   Title decreased pain to 5/10 overall (01/09/16)   Time 4   Period Weeks   Status On-going   PT SHORT TERM GOAL #3   Title improved cervical rotation by 20 degrees B (01/09/16)   Time 4   Period Weeks   Status Achieved           PT Long Term Goals - 02/07/16 [id number]    PT LONG TERM GOAL #1   Title I with advanced HEP   Time 8   Period Weeks   Status On-going   PT LONG TERM GOAL #2   Title Decreased pain overall by 50% since evaluation with ADLS   Baseline No change yet 02/07/16   Time 8   Period Weeks   Status On-going   PT LONG TERM GOAL #3   Title Patient able to stand to do ADLs for 30 min before needing to sit.   Time 8   Period Weeks   Status On-going   PT LONG TERM GOAL #4   Baseline 15 min 02/07/16   Time 8   Period Weeks   Status On-going   PT LONG TERM GOAL #5   Title Able to do up and down stairs to bedroom 2x/day rather than sleeping on the couch   Time 8   Period Weeks   Status New               Plan - 02/07/16 [id number]    Clinical Impression Statement The patient reports no improvement in pain since starting PT.  Her spinal ROM continues to be extremely limited and painful.  She was recently referred to this clinic for a trial of dry needling since she had not made progress with previously tried interventions.   Dry needling has  been performed with manual  therapy as well as initiation of exercise.  Would anticipate an improvement in the next 1-2 visits.  Minimal progress toward goals.  Therapist closely monitoring all and modifying frequently for pain.     Pt will benefit from skilled therapeutic intervention in order to improve on the following deficits Decreased range of motion;Decreased activity tolerance;Pain;Decreased strength   Rehab Potential Fair   PT Frequency 2x / week   PT Duration 8 weeks   PT Treatment/Interventions ADLs/Self Care Home Management;Electrical Stimulation;Cryotherapy;Moist Heat;Therapeutic exercise;Manual techniques;Patient/family education;Neuromuscular re-education;Ultrasound;Passive range of motion;Dry needling   PT Next Visit Plan assess response to 2nd  DN; try seated spinal stabilization and isometrics;  try e-stim/heat in sitting;   G code needed;  ? FOTO        Problem List Patient Active Problem List   Diagnosis Date Noted  . Varicose veins 01/13/2016  . Chronic hepatitis C virus infection ([place]) 12/15/2015  . Allergic rhinitis 12/09/2015  . Insomnia 12/09/2015  . Genital herpes 12/09/2015  . Hyperlipidemia 12/09/2015  . Menopausal symptom 12/09/2015  . Compression fracture of body of thoracic vertebra 02/17/2015  . Lumbar compression fracture, L1-2 endplate  624THL  . Thoracic compression fracture, T12 endplate  624THL  . Fracture of metatarsal bone of right foot, 4th neck 10/22/2014  . Has jumped from building 10/22/2014  . Bipolar 1 disorder ([place])   . Schizoaffective disorder ([place])   . Calcaneal fracture, Left  10/19/2014   Ruben Im, PT 02/07/2016 9:40 AM Phone: [phone] Fax: [phone] Alvera Singh 02/07/2016, 9:40 AM  [facility] [address], [place], [place], [address] Phone: [phone]   Fax:  [phone]  Name: Lauren Fuentes MRN: [id number] Date of Birth:  [birth year]

## 2016-02-08 ENCOUNTER — Ambulatory Visit: Payer: Medicare Other | Admitting: Family Medicine

## 2016-02-09 ENCOUNTER — Ambulatory Visit: Payer: Medicare Other | Admitting: Physical Therapy

## 2016-02-09 DIAGNOSIS — M545 Low back pain, unspecified: Secondary | ICD-10-CM

## 2016-02-09 DIAGNOSIS — R262 Difficulty in walking, not elsewhere classified: Secondary | ICD-10-CM

## 2016-02-09 DIAGNOSIS — M2560 Stiffness of unspecified joint, not elsewhere classified: Secondary | ICD-10-CM | POA: Diagnosis not present

## 2016-02-09 DIAGNOSIS — R293 Abnormal posture: Secondary | ICD-10-CM

## 2016-02-09 DIAGNOSIS — M542 Cervicalgia: Secondary | ICD-10-CM

## 2016-02-09 DIAGNOSIS — R6889 Other general symptoms and signs: Secondary | ICD-10-CM | POA: Diagnosis not present

## 2016-02-09 NOTE — Therapy (Signed)
[facility] [address] [place], [place], [address] Phone: [phone]   Fax:  [phone]  Physical Therapy Treatment  Patient Details  Name: Lauren Fuentes MRN: [id number] Date of Birth: [birth year] Referring Provider: Claretta Fraise MD  Encounter Date: 02/09/2016      PT End of Session - 02/09/16 0854    Visit Number 10   Number of Visits 20   Date for PT Re-Evaluation 04/03/16   Authorization Type G code at visit 10;    PT Start Time 0845   PT Stop Time 0945   PT Time Calculation (min) 60 min   Activity Tolerance Patient tolerated treatment well      Past Medical History  Diagnosis Date  . Bipolar 1 disorder ([place])   . Schizoaffective disorder ([place])   . Post traumatic stress disorder (PTSD)   . Calcaneal fracture, Left  10/19/2014  . Thoracic compression fracture, T12 endplate  624THL  . Lumbar compression fracture, L1-2 endplate  624THL  . Fracture of metatarsal bone of right foot, 4th neck 10/22/2014  . Back pain, chronic   . GERD (gastroesophageal reflux disease)   . Arthritis   . Headache     hx migraines, tension headaches  . Hepatitis     stated in the past - pt denies this as of 07/28/15)    Past Surgical History  Procedure Laterality Date  . Tubal ligation    . Orif calcaneous fracture Left 10/21/2014    Procedure: OPEN REDUCTION INTERNAL FIXATION (ORIF) LEFT CALCANEOUS FRACTURE;  Surgeon: Rozanna Box, MD;  Location: [place];  Service: Orthopedics;  Laterality: Left;  Marland Kitchen Kyphoplasty N/A 02/17/2015    Procedure: T12 KYPHOPLASTY;  Surgeon: Melina Schools, MD;  Location: [place];  Service: Orthopedics;  Laterality: N/A;  . Colonoscopy    . Hardware removal Left 07/29/2015    Procedure: HARDWARE REMOVAL,LEFT HEEL;  Surgeon: Altamese Luray, MD;  Location: [place];  Service: Orthopedics;  Laterality: Left;  . Steriod injection Right 07/29/2015    Procedure: Subtalar Joint injection with Fluoro  guidance ;  Surgeon: Altamese [place], MD;  Location: [place];  Service: Orthopedics;  Laterality: Right;    There were no vitals filed for this visit.  Visit Diagnosis:  Bilateral low back pain without sciatica  Neck pain  Stiffness of joints, multiple sites  Activity intolerance  Abnormal posture  Joint stiffness of spine  Difficulty walking      Subjective Assessment - 02/09/16 0846    Subjective I was sore, hurting that night some tingling.  Tightness, burning and stinging in low back;   Currently in Pain? Yes   Pain Score 7    Pain Location Back   Pain Orientation Right;Left;Lower;Mid;Upper   Pain Descriptors / Indicators Tightness   Pain Type Chronic pain   Pain Onset More than a month ago   Pain Frequency Constant            [facility] PT Assessment - 02/09/16 0001    Observation/Other Assessments   Focus on Therapeutic Outcomes (FOTO)  57% limitation   ROM / Strength   AROM / PROM / Strength AROM   AROM   Cervical Flexion 15   Cervical - Right Side Bend 25   Cervical - Left Side Bend 20   Cervical - Right Rotation 20   Cervical - Left Rotation 25   Lumbar Flexion 10   Lumbar Extension 5   Lumbar - Right Side Bend 14  Lumbar - Left Side Bend 12                     [facility] Adult PT Treatment/Exercise - 02/09/16 0001    Neck Exercises: Seated   Other Seated Exercise seated B UE:  ER, rows and shoulder ext with yellow band 10x each   Moist Heat Therapy   Number Minutes Moist Heat 15 Minutes   Moist Heat Location Cervical;Lumbar Spine   Electrical Stimulation   Electrical Stimulation Location B interscapular region and bilateral lumbar paraspinals   Electrical Stimulation Action IFC   Electrical Stimulation Parameters 14 ma   Electrical Stimulation Goals Pain   Manual Therapy   Manual Therapy Myofascial release   Soft tissue mobilization light soft tissue to bilateral lumbar paraspinals, rhomboids and upper traps   Myofascial Release B lumbar  paraspinals and gluteals, piriformis          Trigger Point Dry Needling - 02/09/16 1150    Consent Given? Yes   Muscles Treated Upper Body Levator scapulae;Rhomboids   Muscles Treated Lower Body Gluteus maximus;Piriformis  B lumbar multifidi   Levator Scapulae Response Twitch response elicited;Palpable increased muscle length   Rhomboids Response Twitch response elicited;Palpable increased muscle length   Gluteus Maximus Response Palpable increased muscle length   Piriformis Response Palpable increased muscle length        Performed bilaterally.        PT Education - 02/09/16 1144    Education provided Yes   Education Details seated UE rows and extensions for postural strengthening   Person(s) Educated Patient   Methods Explanation;Demonstration;Handout   Comprehension Verbalized understanding;Returned demonstration          PT Short Term Goals - 02/09/16 1151    PT SHORT TERM GOAL #1   Title I with initial HEP (01/09/16)   Status Achieved   PT SHORT TERM GOAL #2   Title decreased pain to 5/10 overall (01/09/16)   Time 4   Period Weeks   Status On-going   PT SHORT TERM GOAL #3   Title improved cervical rotation by 20 degrees B (01/09/16)   Status Achieved           PT Long Term Goals - 02/09/16 1152    PT LONG TERM GOAL #1   Title I with advanced HEP   Time 8   Period Weeks   Status On-going   PT LONG TERM GOAL #2   Title Decreased pain overall by 50% since evaluation with ADLS   Time 8   Period Weeks   Status On-going   PT LONG TERM GOAL #3   Title Patient able to stand to do ADLs for 30 min before needing to sit.   Time 8   Period Weeks   Status On-going   PT LONG TERM GOAL #4   Title Patient able to walk for 30 min before needing stop for pain   Time 8   Period Weeks   Status On-going   PT LONG TERM GOAL #5   Title Able to do up and down stairs to bedroom 2x/day rather than sleeping on the couch   Time 8   Period Weeks   Status On-going                Plan - 02/09/16 1145    Clinical Impression Statement The patient reports a lot of soreness after last treatment session.  Her FOTO functional outcome score has improved slightly from 60%  on initial eval to 57% today.  Min progress toward goals.  Patient is very fearful of movement.  She was able to do very low level seated ther ex with min exacerbation.  Multiple tender points from interscapular region to lumbo-pelvic region.  If no further progress by next visit, will discharge PT and refer back to MD for further work up.     PT Next Visit Plan assess response to 3rd  DN; try seated spinal stabilization and isometrics;  try Nu-Step;  estim/heat in sitting          G-Codes - 28-Feb-2016 1153    Functional Assessment Tool Used FOTO 60% LIMITATION   Functional Limitation Mobility: Walking and moving around   Mobility: Walking and Moving Around Current Status [id number]) At least 60 percent but less than 80 percent impaired, limited or restricted   Mobility: Walking and Moving Around Goal Status [id number]) At least 40 percent but less than 60 percent impaired, limited or restricted      Problem List Patient Active Problem List   Diagnosis Date Noted  . Varicose veins 01/13/2016  . Chronic hepatitis C virus infection ([place]) 12/15/2015  . Allergic rhinitis 12/09/2015  . Insomnia 12/09/2015  . Genital herpes 12/09/2015  . Hyperlipidemia 12/09/2015  . Menopausal symptom 12/09/2015  . Compression fracture of body of thoracic vertebra 02/17/2015  . Lumbar compression fracture, L1-2 endplate  624THL  . Thoracic compression fracture, T12 endplate  624THL  . Fracture of metatarsal bone of right foot, 4th neck 10/22/2014  . Has jumped from building 10/22/2014  . Bipolar 1 disorder ([place])   . Schizoaffective disorder ([place])   . Calcaneal fracture, Left  10/19/2014    Ruben Im C Feb 28, 2016, 11:54 AM  [facility] [address] [place], [place], [address] Phone: [phone]   Fax:  [phone]  Name: Yumalai Gritz MRN: [id number] Date of Birth: 1966/11/30    Ruben Im, PT 02-28-16 11:54 AM Phone: [phone] Fax: [phone]

## 2016-02-09 NOTE — Patient Instructions (Signed)
   [facility] [address], [address] [place], [place] [address] Phone # [phone] Fax [phone]  

## 2016-02-10 ENCOUNTER — Telehealth: Payer: Self-pay | Admitting: Family Medicine

## 2016-02-10 IMAGING — RF DG C-ARM 61-120 MIN
1 series · 1 of 1 positions shown · non-contrast
Comparison: MRI of the lumbar spine 10/21/2014.

CLINICAL DATA: Painful compression fracture.

EXAM:
TIME:
Fluoroscopy Time (in minutes and seconds):  31 seconds
Number of Acquired Images:  Two

[Series 1: run · 1 of 1 slices shown]
[im 1/1]
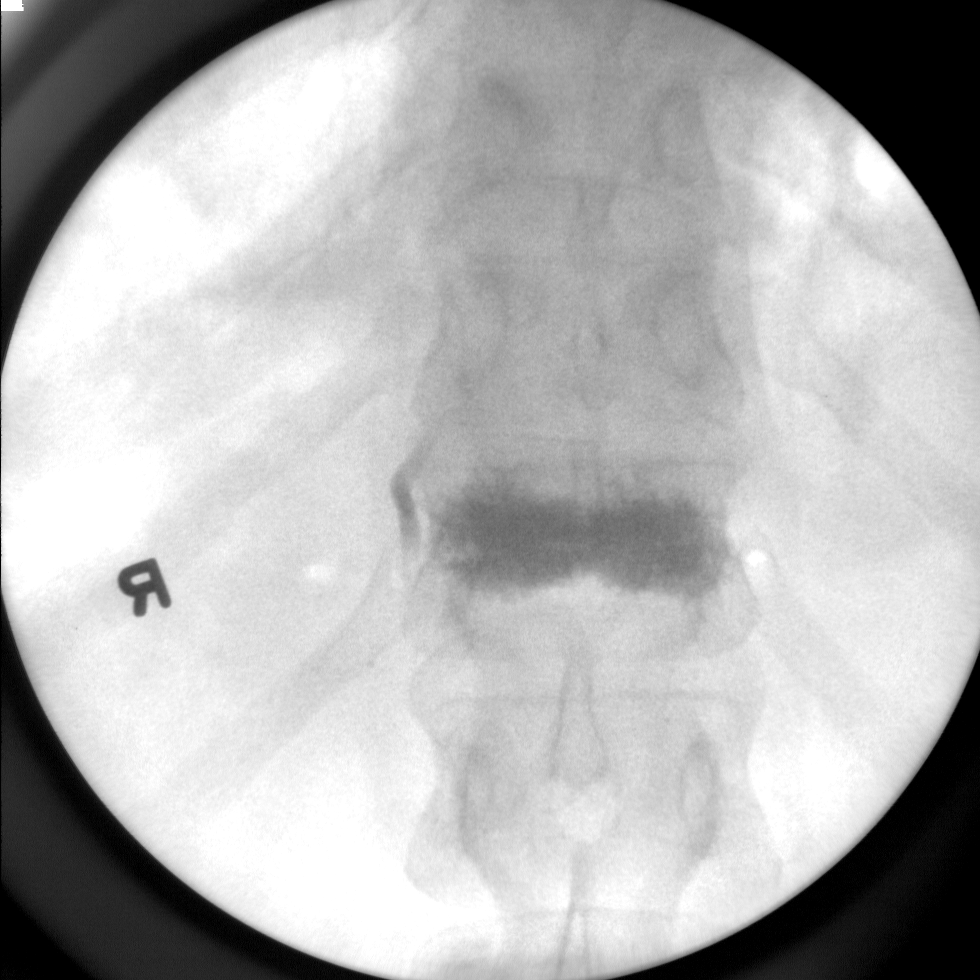

[1 of 1 positions shown; findings below may reference images not displayed]

FINDINGS: AP and lateral spot films demonstrate vertebral augmentation at T12
via a bipedicular approach. Extraosseous migration of
methylmethacrylate fills a paravertebral vein on the RIGHT. No
posterior extension into the foramen or spinal canal.
IMPRESSION: T12 fracture as described.

## 2016-02-10 NOTE — Addendum Note (Signed)
Addended by: Timmothy Euler on: 02/10/2016 04:53 PM   Modules accepted: Orders, Medications

## 2016-02-10 NOTE — Telephone Encounter (Signed)
Called again, after second thought it is best to stop the lipitor.   Follow up with hepatology.   Remove any reason for elevated liver enzymes fo now.   Laroy Apple, MD [facility] 02/10/2016, 4:49 PM

## 2016-02-10 NOTE — Telephone Encounter (Signed)
Called and discussed, low level Hep C with soime liver stress with elevated Enzymes.   I am convinced that her enzyme elvation is more likely Hep c than statin induced. She has no complaints.   Will ask for liver care to contact her again.  She is willing to go.   Laroy Apple, MD [facility] 02/10/2016, 4:39 PM

## 2016-02-13 ENCOUNTER — Ambulatory Visit: Payer: Medicare Other | Admitting: Family Medicine

## 2016-02-14 ENCOUNTER — Ambulatory Visit: Payer: Medicare Other | Admitting: Physical Therapy

## 2016-02-14 ENCOUNTER — Encounter: Payer: Self-pay | Admitting: Family Medicine

## 2016-02-14 DIAGNOSIS — R293 Abnormal posture: Secondary | ICD-10-CM | POA: Diagnosis not present

## 2016-02-14 DIAGNOSIS — R6889 Other general symptoms and signs: Secondary | ICD-10-CM

## 2016-02-14 DIAGNOSIS — M542 Cervicalgia: Secondary | ICD-10-CM

## 2016-02-14 DIAGNOSIS — R262 Difficulty in walking, not elsewhere classified: Secondary | ICD-10-CM

## 2016-02-14 DIAGNOSIS — M545 Low back pain, unspecified: Secondary | ICD-10-CM

## 2016-02-14 DIAGNOSIS — M2560 Stiffness of unspecified joint, not elsewhere classified: Secondary | ICD-10-CM

## 2016-02-14 NOTE — Therapy (Signed)
[facility] [address] [place], [place], [address] Phone: [phone]   Fax:  [phone]  Physical Therapy Treatment  Patient Details  Name: Lauren Fuentes MRN: [id number] Date of Birth: [birth year] Referring Provider: Claretta Fraise MD  Encounter Date: 02/14/2016      PT End of Session - 02/14/16 0917    Visit Number 11   Number of Visits 20   Date for PT Re-Evaluation 04/03/16   Authorization Type G code at visit 20;    PT Start Time 0840   PT Stop Time 0937   PT Time Calculation (min) 57 min   Activity Tolerance Patient tolerated treatment well      Past Medical History  Diagnosis Date  . Bipolar 1 disorder ([place])   . Schizoaffective disorder ([place])   . Post traumatic stress disorder (PTSD)   . Calcaneal fracture, Left  10/19/2014  . Thoracic compression fracture, T12 endplate  624THL  . Lumbar compression fracture, L1-2 endplate  624THL  . Fracture of metatarsal bone of right foot, 4th neck 10/22/2014  . Back pain, chronic   . GERD (gastroesophageal reflux disease)   . Arthritis   . Headache     hx migraines, tension headaches  . Hepatitis     stated in the past - pt denies this as of 07/28/15)    Past Surgical History  Procedure Laterality Date  . Tubal ligation    . Orif calcaneous fracture Left 10/21/2014    Procedure: OPEN REDUCTION INTERNAL FIXATION (ORIF) LEFT CALCANEOUS FRACTURE;  Surgeon: Rozanna Box, MD;  Location: [place];  Service: Orthopedics;  Laterality: Left;  Marland Kitchen Kyphoplasty N/A 02/17/2015    Procedure: T12 KYPHOPLASTY;  Surgeon: Melina Schools, MD;  Location: [facility];  Service: Orthopedics;  Laterality: N/A;  . Colonoscopy    . Hardware removal Left 07/29/2015    Procedure: HARDWARE REMOVAL,LEFT HEEL;  Surgeon: Altamese Everly, MD;  Location: [facility];  Service: Orthopedics;  Laterality: Left;  . Steriod injection Right 07/29/2015    Procedure: Subtalar Joint injection with Fluoro  guidance ;  Surgeon: Altamese [place], MD;  Location: [facility];  Service: Orthopedics;  Laterality: Right;    There were no vitals filed for this visit.  Visit Diagnosis:  Neck pain  Bilateral low back pain without sciatica  Stiffness of joints, multiple sites  Activity intolerance  Abnormal posture  Joint stiffness of spine  Difficulty walking      Subjective Assessment - 02/14/16 0842    Subjective I feel so much better.  I went for a walk over the weekend but I did have to stop and sit down.  I see a big change.     Currently in Pain? Yes   Pain Score 5    Pain Location Back   Pain Type Chronic pain   Pain Onset More than a month ago   Pain Frequency Constant   Pain Score 5   Pain Location Neck   Pain Type Chronic pain   Pain Frequency Constant                         [facility] Adult PT Treatment/Exercise - 02/14/16 0001    Knee/Hip Exercises: Aerobic   Nustep L1 Seat 8 Arms 9 5 min   Shoulder Exercises: Standing   Extension Strengthening;Both;10 reps   Theraband Level (Shoulder Extension) Level 1 (Yellow)   Row Strengthening;Both;10 reps;Theraband   Theraband Level (Shoulder Row) Level  1 (Yellow)   Other Standing Exercises 2# ab brace with shoulder to shoulder 1 min   Other Standing Exercises 2# weight pass around waist 1 min   Moist Heat Therapy   Number Minutes Moist Heat 15 Minutes   Moist Heat Location Cervical;Lumbar Spine   Electrical Stimulation   Electrical Stimulation Location B lumbar and interscapular region   Electrical Stimulation Action IFC   Electrical Stimulation Parameters 14 ma   Electrical Stimulation Goals Pain   Manual Therapy   Soft tissue mobilization light soft tissue to bilateral lumbar paraspinals, rhomboids and upper traps   Myofascial Release B lumbar paraspinals and gluteals, piriformis          Trigger Point Dry Needling - 02/14/16 0851    Consent Given? Yes   Muscles Treated Upper Body Upper trapezius;Levator  scapulae;Rhomboids;Subscapularis   Muscles Treated Lower Body --  B multifidi   Upper Trapezius Response Twitch reponse elicited;Palpable increased muscle length   Levator Scapulae Response Twitch response elicited;Palpable increased muscle length   Rhomboids Response Palpable increased muscle length   Subscapularis Response Palpable increased muscle length      Performed Bilaterally          PT Short Term Goals - 02/14/16 [id number]    PT SHORT TERM GOAL #1   Title I with initial HEP (01/09/16)   Status Achieved   PT SHORT TERM GOAL #2   Title decreased pain to 5/10 overall (01/09/16)   Status Achieved   PT SHORT TERM GOAL #3   Title improved cervical rotation by 20 degrees B (01/09/16)   Status Achieved           PT Long Term Goals - 02/14/16 0924    PT LONG TERM GOAL #1   Title I with advanced HEP   Time 8   Period Weeks   Status On-going   PT LONG TERM GOAL #2   Title Decreased pain overall by 50% since evaluation with ADLS   Time 8   Period Weeks   Status On-going   PT LONG TERM GOAL #3   Title Patient able to stand to do ADLs for 30 min before needing to sit.   Time 8   Period Weeks   Status On-going   PT LONG TERM GOAL #4   Title Patient able to walk for 30 min before needing stop for pain   Time 8   Period Weeks   Status On-going   PT LONG TERM GOAL #5   Title Able to do up and down stairs to bedroom 2x/day rather than sleeping on the couch   Time 8   Period Weeks   Status On-going               Plan - 02/14/16 [id number]    Clinical Impression Statement The patient is able to participate in low level postural strengthening for the first time since starting PT with min complaint of pain.  Verbal cues needed to for abdominal brace and to avoid knee hyperextension.  Decreased trigger point size and number.  Primary tender spots today in right interscapular muscles and subocciptals.    Patient seems to have turned a corner in her rehab and reports a  positive change.  Will continue with emphasis on increasing exercise.     PT Next Visit Plan assess response to 4th  DN; try seated spinal stabilization and isometrics; increase time on Nu-Step;  wall ex for postural strength;   estim/heat in sitting  Problem List Patient Active Problem List   Diagnosis Date Noted  . Varicose veins 01/13/2016  . Chronic hepatitis C virus infection ([place]) 12/15/2015  . Allergic rhinitis 12/09/2015  . Insomnia 12/09/2015  . Genital herpes 12/09/2015  . Hyperlipidemia 12/09/2015  . Menopausal symptom 12/09/2015  . Compression fracture of body of thoracic vertebra 02/17/2015  . Lumbar compression fracture, L1-2 endplate  624THL  . Thoracic compression fracture, T12 endplate  624THL  . Fracture of metatarsal bone of right foot, 4th neck 10/22/2014  . Has jumped from building 10/22/2014  . Bipolar 1 disorder ([place])   . Schizoaffective disorder ([place])   . Calcaneal fracture, Left  10/19/2014    Ruben Im C 02/14/2016, 9:26 AM  [facility] [address], [place], [place], [address] Phone: [phone]   Fax:  [phone]  Name: Lauren Fuentes MRN: [id number] Date of Birth: 25-Apr-1966  Ruben Im, PT 02/14/2016 9:26 AM Phone: [phone] Fax: [phone]

## 2016-02-21 ENCOUNTER — Ambulatory Visit: Payer: Medicare Other | Admitting: Physical Therapy

## 2016-02-21 ENCOUNTER — Encounter: Payer: Medicare Other | Admitting: *Deleted

## 2016-02-21 DIAGNOSIS — R6889 Other general symptoms and signs: Secondary | ICD-10-CM

## 2016-02-21 DIAGNOSIS — M2560 Stiffness of unspecified joint, not elsewhere classified: Secondary | ICD-10-CM | POA: Diagnosis not present

## 2016-02-21 DIAGNOSIS — M542 Cervicalgia: Secondary | ICD-10-CM

## 2016-02-21 DIAGNOSIS — R293 Abnormal posture: Secondary | ICD-10-CM | POA: Diagnosis not present

## 2016-02-21 DIAGNOSIS — Z1231 Encounter for screening mammogram for malignant neoplasm of breast: Secondary | ICD-10-CM | POA: Diagnosis not present

## 2016-02-21 DIAGNOSIS — R262 Difficulty in walking, not elsewhere classified: Secondary | ICD-10-CM | POA: Diagnosis not present

## 2016-02-21 DIAGNOSIS — M545 Low back pain, unspecified: Secondary | ICD-10-CM

## 2016-02-21 LAB — HM MAMMOGRAPHY: HM MAMMO: NEGATIVE

## 2016-02-21 NOTE — Therapy (Signed)
[facility] [address], [place], [place], [address] Phone: [phone]   Fax:  [phone]  Physical Therapy Treatment  Patient Details  Name: Lauren Fuentes MRN: [id number] Date of Birth: [birth year] Referring Provider: Claretta Fraise MD  Encounter Date: 02/21/2016      PT End of Session - 02/21/16 0907    Visit Number 12   Number of Visits 20   Date for PT Re-Evaluation 04/03/16   Authorization Type G code at visit 20;    PT Start Time 0800   PT Stop Time 0910   PT Time Calculation (min) 70 min   Activity Tolerance Patient limited by pain      Past Medical History  Diagnosis Date  . Bipolar 1 disorder ([place])   . Schizoaffective disorder ([place])   . Post traumatic stress disorder (PTSD)   . Calcaneal fracture, Left  10/19/2014  . Thoracic compression fracture, T12 endplate  624THL  . Lumbar compression fracture, L1-2 endplate  624THL  . Fracture of metatarsal bone of right foot, 4th neck 10/22/2014  . Back pain, chronic   . GERD (gastroesophageal reflux disease)   . Arthritis   . Headache     hx migraines, tension headaches  . Hepatitis     stated in the past - pt denies this as of 07/28/15)    Past Surgical History  Procedure Laterality Date  . Tubal ligation    . Orif calcaneous fracture Left 10/21/2014    Procedure: OPEN REDUCTION INTERNAL FIXATION (ORIF) LEFT CALCANEOUS FRACTURE;  Surgeon: Rozanna Box, MD;  Location: [facility] [place];  Service: Orthopedics;  Laterality: Left;  Marland Kitchen Kyphoplasty N/A 02/17/2015    Procedure: T12 KYPHOPLASTY;  Surgeon: Melina Schools, MD;  Location: [place];  Service: Orthopedics;  Laterality: N/A;  . Colonoscopy    . Hardware removal Left 07/29/2015    Procedure: HARDWARE REMOVAL,LEFT HEEL;  Surgeon: Altamese [place], MD;  Location: [facility];  Service: Orthopedics;  Laterality: Left;  . Steriod injection Right 07/29/2015    Procedure: Subtalar Joint injection with Fluoro guidance ;   Surgeon: Altamese [place], MD;  Location: [facility];  Service: Orthopedics;  Laterality: Right;    There were no vitals filed for this visit.  Visit Diagnosis:  Neck pain  Bilateral low back pain without sciatica  Stiffness of joints, multiple sites  Activity intolerance  Abnormal posture      Subjective Assessment - 02/21/16 0817    Subjective Still stiff and sore in my back and neck.  Hard to turn my head.  I do feel like I'm getting better.  I went for another walk and I can go up and down the steps faster.     Currently in Pain? Yes   Pain Score 4    Pain Location Back   Pain Type Chronic pain   Pain Frequency Constant   Pain Score 5   Pain Location Neck   Pain Type Chronic pain                         [facility] Adult PT Treatment/Exercise - 02/21/16 0001    Exercises   Exercises Knee/Hip   Neck Exercises: Seated   Other Seated Exercise 2# plyo ball shoulder to shoulder 1 minute   Other Seated Exercise rotation mob with movement with [place] 10x R/L   Knee/Hip Exercises: Stretches   Other Knee/Hip Stretches hip extension isometric against wall 5 sec hold 5x R/L  Knee/Hip Exercises: Aerobic   Nustep L1 6 min   Shoulder Exercises: Standing   Extension Strengthening;Both;10 reps   Theraband Level (Shoulder Extension) Level 1 (Yellow)   Row Strengthening;Both;10 reps;Theraband   Theraband Level (Shoulder Row) Level 1 (Yellow)   Moist Heat Therapy   Number Minutes Moist Heat 15 Minutes   Moist Heat Location Cervical;Lumbar Spine   Electrical Stimulation   Electrical Stimulation Location B lumbar and interscapular region   Electrical Stimulation Action IFc   Electrical Stimulation Parameters 9 ma   Electrical Stimulation Goals Pain   Manual Therapy   Soft tissue mobilization light soft tissue to bilateral lumbar paraspinals, rhomboids and upper traps   Myofascial Release B lumbar paraspinals and gluteals, piriformis          Trigger Point Dry Needling -  02/21/16 0917    Consent Given? Yes   Muscles Treated Lower Body --  B lumbar multifidi   Upper Trapezius Response Twitch reponse elicited;Palpable increased muscle length   Levator Scapulae Response Twitch response elicited;Palpable increased muscle length   Rhomboids Response Palpable increased muscle length       Performed bilaterally.         PT Short Term Goals - 02/21/16 0915    PT SHORT TERM GOAL #1   Title I with initial HEP (01/09/16)   Status Achieved   PT SHORT TERM GOAL #2   Title decreased pain to 5/10 overall (01/09/16)   Status Achieved   PT SHORT TERM GOAL #3   Title improved cervical rotation by 20 degrees B (01/09/16)   Status Achieved           PT Long Term Goals - 02/21/16 0915    PT LONG TERM GOAL #1   Title I with advanced HEP   Time 8   Period Weeks   Status On-going   PT LONG TERM GOAL #2   Title Decreased pain overall by 50% since evaluation with ADLS   Time 8   Period Weeks   Status On-going   PT LONG TERM GOAL #3   Title Patient able to stand to do ADLs for 30 min before needing to sit.   Time 8   Period Weeks   Status On-going   PT LONG TERM GOAL #4   Title Patient able to walk for 30 min before needing stop for pain   Time 8   Period Weeks   Status On-going   PT LONG TERM GOAL #5   Title Able to do up and down stairs to bedroom 2x/day rather than sleeping on the couch   Time 8   Period Weeks   Status On-going               Plan - 02/21/16 0908    Clinical Impression Statement The patient participates in low level back and neck exercises with an increase in pain with prolonged standing, improved with modifying ex to sitting position.  Verbal cues for abdominal brace and neutral head with ex.  Tender points and muscular spasm in neck  with manual techniques.  Continue with emphasis on progressive exercise and functional reactivation.  Therapist closely monitoring response throughout treatment session.     PT Next Visit  Plan assess response to 5th  DN; recheck AROM;   try seated spinal stabilization and isometrics; increase time on Nu-Step;  wall ex for postural strength;   estim/heat in sitting        Problem List Patient Active Problem List  Diagnosis Date Noted  . Varicose veins 01/13/2016  . Chronic hepatitis C virus infection ([place]) 12/15/2015  . Allergic rhinitis 12/09/2015  . Insomnia 12/09/2015  . Genital herpes 12/09/2015  . Hyperlipidemia 12/09/2015  . Menopausal symptom 12/09/2015  . Compression fracture of body of thoracic vertebra 02/17/2015  . Lumbar compression fracture, L1-2 endplate  624THL  . Thoracic compression fracture, T12 endplate  624THL  . Fracture of metatarsal bone of right foot, 4th neck 10/22/2014  . Has jumped from building 10/22/2014  . Bipolar 1 disorder ([place])   . Schizoaffective disorder ([place])   . Calcaneal fracture, Left  10/19/2014    Alvera Singh 02/21/2016, 9:18 AM  [facility] [address], [place], [place], [address] Phone: [phone]   Fax:  [phone]  Name: Lauren Fuentes MRN: [id number] Date of Birth: 06/29/1966 Ruben Im, PT 02/21/2016 9:21 AM Phone: [phone] Fax: [phone]

## 2016-02-22 DIAGNOSIS — Z79891 Long term (current) use of opiate analgesic: Secondary | ICD-10-CM | POA: Diagnosis not present

## 2016-02-22 DIAGNOSIS — M47816 Spondylosis without myelopathy or radiculopathy, lumbar region: Secondary | ICD-10-CM | POA: Diagnosis not present

## 2016-02-22 DIAGNOSIS — M542 Cervicalgia: Secondary | ICD-10-CM | POA: Diagnosis not present

## 2016-02-22 DIAGNOSIS — G8929 Other chronic pain: Secondary | ICD-10-CM | POA: Diagnosis not present

## 2016-02-22 DIAGNOSIS — F419 Anxiety disorder, unspecified: Secondary | ICD-10-CM | POA: Diagnosis not present

## 2016-02-22 DIAGNOSIS — Z5181 Encounter for therapeutic drug level monitoring: Secondary | ICD-10-CM | POA: Diagnosis not present

## 2016-02-22 DIAGNOSIS — Z79899 Other long term (current) drug therapy: Secondary | ICD-10-CM | POA: Diagnosis not present

## 2016-02-23 ENCOUNTER — Ambulatory Visit: Payer: Medicare Other | Attending: Family Medicine | Admitting: Physical Therapy

## 2016-02-23 DIAGNOSIS — R262 Difficulty in walking, not elsewhere classified: Secondary | ICD-10-CM | POA: Diagnosis not present

## 2016-02-23 DIAGNOSIS — M542 Cervicalgia: Secondary | ICD-10-CM | POA: Diagnosis not present

## 2016-02-23 DIAGNOSIS — M2560 Stiffness of unspecified joint, not elsewhere classified: Secondary | ICD-10-CM | POA: Diagnosis not present

## 2016-02-23 DIAGNOSIS — M545 Low back pain, unspecified: Secondary | ICD-10-CM

## 2016-02-23 DIAGNOSIS — R293 Abnormal posture: Secondary | ICD-10-CM | POA: Diagnosis not present

## 2016-02-23 DIAGNOSIS — R6889 Other general symptoms and signs: Secondary | ICD-10-CM | POA: Insufficient documentation

## 2016-02-23 NOTE — Therapy (Signed)
[facility] [address], [place], [place], [address] Phone: [phone]   Fax:  [phone]  Physical Therapy Treatment  Patient Details  Name: Lauren Fuentes MRN: [id number] Date of Birth: 08-16-1966 Referring Provider: Claretta Fraise MD  Encounter Date: 02/23/2016      PT End of Session - 02/23/16 0923    Visit Number 13   Number of Visits 20   Date for PT Re-Evaluation 04/03/16   Authorization Type G code at visit 20;    PT Start Time 0845   PT Stop Time 0935   PT Time Calculation (min) 50 min   Activity Tolerance Patient tolerated treatment well      Past Medical History  Diagnosis Date  . Bipolar 1 disorder ([place])   . Schizoaffective disorder ([place])   . Post traumatic stress disorder (PTSD)   . Calcaneal fracture, Left  10/19/2014  . Thoracic compression fracture, T12 endplate  624THL  . Lumbar compression fracture, L1-2 endplate  624THL  . Fracture of metatarsal bone of right foot, 4th neck 10/22/2014  . Back pain, chronic   . GERD (gastroesophageal reflux disease)   . Arthritis   . Headache     hx migraines, tension headaches  . Hepatitis     stated in the past - pt denies this as of 07/28/15)    Past Surgical History  Procedure Laterality Date  . Tubal ligation    . Orif calcaneous fracture Left 10/21/2014    Procedure: OPEN REDUCTION INTERNAL FIXATION (ORIF) LEFT CALCANEOUS FRACTURE;  Surgeon: Rozanna Box, MD;  Location: [place];  Service: Orthopedics;  Laterality: Left;  Marland Kitchen Kyphoplasty N/A 02/17/2015    Procedure: T12 KYPHOPLASTY;  Surgeon: Melina Schools, MD;  Location: [place];  Service: Orthopedics;  Laterality: N/A;  . Colonoscopy    . Hardware removal Left 07/29/2015    Procedure: HARDWARE REMOVAL,LEFT HEEL;  Surgeon: Altamese [place]burg, MD;  Location: [place];  Service: Orthopedics;  Laterality: Left;  . Steriod injection Right 07/29/2015    Procedure: Subtalar Joint injection with Fluoro  guidance ;  Surgeon: Altamese Ramsey, MD;  Location: [place];  Service: Orthopedics;  Laterality: Right;    There were no vitals filed for this visit.  Visit Diagnosis:  Neck pain  Bilateral low back pain without sciatica  Stiffness of joints, multiple sites  Activity intolerance  Abnormal posture  Difficulty walking      Subjective Assessment - 02/23/16 0841    Subjective I'm on top of the world, I went to [facility] and she gave me something for inflammation (Torodol)  and gave me a shot and it really worked.  I'm so excited!   Currently in Pain? Yes   Pain Score 2    Pain Location Back   Pain Orientation Right;Left   Pain Type Chronic pain   Pain Frequency Constant   Pain Score 2   Pain Location Neck   Pain Orientation Right;Left   Pain Frequency Constant                         [facility] Adult PT Treatment/Exercise - 02/23/16 0001    Neck Exercises: Seated   Other Seated Exercise yellow band seated rows and shoulder ext 2x 10 B   Knee/Hip Exercises: Aerobic   Stationary Bike L1 6 min   Knee/Hip Exercises: Standing   Heel Raises Both;1 set;10 reps   Rebounder 3 way weight shift 3 min  Other Standing Knee Exercises sit to stand no hands high table 10x   Shoulder Exercises: Standing   Other Standing Exercises yellow band: overhead with medium grip, ER, diagonals R/L, HABD 10x each   Moist Heat Therapy   Number Minutes Moist Heat 15 Minutes   Moist Heat Location Cervical;Lumbar Spine   Electrical Stimulation   Electrical Stimulation Location B cervical    Electrical Stimulation Action IFC   Electrical Stimulation Parameters 9 ma   Electrical Stimulation Goals Pain   Manual Therapy   Soft tissue mobilization B upper traps, levators, suboccipitals          Trigger Point Dry Needling - 02/23/16 [id number]    Consent Given? Yes   Muscles Treated Upper Body Upper trapezius;Suboccipitals muscle group;Levator scapulae   Upper Trapezius Response  Twitch reponse elicited;Palpable increased muscle length   SubOccipitals Response Palpable increased muscle length   Levator Scapulae Response Twitch response elicited;Palpable increased muscle length      Performed bilaterally.            PT Short Term Goals - 02/23/16 [id number]    PT SHORT TERM GOAL #1   Title I with initial HEP (01/09/16)   Status Achieved   PT SHORT TERM GOAL #2   Title decreased pain to 5/10 overall (01/09/16)   Status Achieved   PT SHORT TERM GOAL #3   Title improved cervical rotation by 20 degrees B (01/09/16)   Status Achieved           PT Long Term Goals - 02/23/16 0924    PT LONG TERM GOAL #1   Title I with advanced HEP   Time 8   Period Weeks   Status On-going   PT LONG TERM GOAL #2   Title Decreased pain overall by 50% since evaluation with ADLS   Time 8   Period Weeks   Status On-going   PT LONG TERM GOAL #3   Title Patient able to stand to do ADLs for 30 min before needing to sit.   Time 8   Period Weeks   Status On-going   PT LONG TERM GOAL #4   Title Patient able to walk for 30 min before needing stop for pain   Time 8   Period Weeks   Status On-going   PT LONG TERM GOAL #5   Title Able to do up and down stairs to bedroom 2x/day rather than sleeping on the couch   Time 8   Period Weeks   Status On-going               Plan - 02/23/16 0924    Clinical Impression Statement The patient is in good spirits today about her excellent pain relief with lumbar injection yesterday and new anti-inflammatory medication.  Her pain intensity is much improved to only a 2/10 today.   She is able to participate in increased exercise today with some discomfort with prolonged standing but still at a low pain intensity.  Verbal cues for postural alignment and to monitor response and some position modifications as well.  Continue with ex progression, manual and modalities as needed.     PT Next Visit Plan assess response to  DN; recheck AROM and  progress with LTGs;    try seated spinal stabilization and isometrics; increase time on Nu-Step;  wall ex for postural strength;   estim/heat in sitting        Problem List Patient Active Problem List   Diagnosis Date  Noted  . Varicose veins 01/13/2016  . Chronic hepatitis C virus infection ([place]) 12/15/2015  . Allergic rhinitis 12/09/2015  . Insomnia 12/09/2015  . Genital herpes 12/09/2015  . Hyperlipidemia 12/09/2015  . Menopausal symptom 12/09/2015  . Compression fracture of body of thoracic vertebra 02/17/2015  . Lumbar compression fracture, L1-2 endplate  624THL  . Thoracic compression fracture, T12 endplate  624THL  . Fracture of metatarsal bone of right foot, 4th neck 10/22/2014  . Has jumped from building 10/22/2014  . Bipolar 1 disorder ([place])   . Schizoaffective disorder ([place])   . Calcaneal fracture, Left  10/19/2014    Ruben Im C 02/23/2016, 9:49 AM  [facility] [address] [place], [place], [address] Phone: [phone]   Fax:  [phone]  Name: Aariyah Poelman MRN: [id number] Date of Birth: Sep 12, 1966  Ruben Im, PT 02/23/2016 9:50 AM Phone: [phone] Fax: [phone]

## 2016-02-24 ENCOUNTER — Encounter: Payer: Self-pay | Admitting: *Deleted

## 2016-02-28 ENCOUNTER — Ambulatory Visit: Payer: Medicare Other | Admitting: Physical Therapy

## 2016-03-01 ENCOUNTER — Ambulatory Visit: Payer: Medicare Other | Admitting: Physical Therapy

## 2016-03-01 DIAGNOSIS — R262 Difficulty in walking, not elsewhere classified: Secondary | ICD-10-CM

## 2016-03-01 DIAGNOSIS — M542 Cervicalgia: Secondary | ICD-10-CM | POA: Diagnosis not present

## 2016-03-01 DIAGNOSIS — R293 Abnormal posture: Secondary | ICD-10-CM | POA: Diagnosis not present

## 2016-03-01 DIAGNOSIS — M545 Low back pain, unspecified: Secondary | ICD-10-CM

## 2016-03-01 DIAGNOSIS — R6889 Other general symptoms and signs: Secondary | ICD-10-CM

## 2016-03-01 DIAGNOSIS — M2560 Stiffness of unspecified joint, not elsewhere classified: Secondary | ICD-10-CM | POA: Diagnosis not present

## 2016-03-01 NOTE — Therapy (Signed)
[facility] [address] [place], [place], [address] Phone: [phone]   Fax:  [phone]  Physical Therapy Treatment  Patient Details  Name: Lauren Fuentes MRN: [id number] Date of Birth: 1966-08-19 Referring Provider: Claretta Fraise MD  Encounter Date: 03/01/2016      PT End of Session - 03/01/16 1103    Visit Number 14   Number of Visits 20   Date for PT Re-Evaluation 04/03/16   Authorization Type G code at visit 20;    PT Start Time 0805   PT Stop Time 0850   PT Time Calculation (min) 45 min   Activity Tolerance Patient limited by pain      Past Medical History  Diagnosis Date  . Bipolar 1 disorder ([place])   . Schizoaffective disorder ([place])   . Post traumatic stress disorder (PTSD)   . Calcaneal fracture, Left  10/19/2014  . Thoracic compression fracture, T12 endplate  624THL  . Lumbar compression fracture, L1-2 endplate  624THL  . Fracture of metatarsal bone of right foot, 4th neck 10/22/2014  . Back pain, chronic   . GERD (gastroesophageal reflux disease)   . Arthritis   . Headache     hx migraines, tension headaches  . Hepatitis     stated in the past - pt denies this as of 07/28/15)    Past Surgical History  Procedure Laterality Date  . Tubal ligation    . Orif calcaneous fracture Left 10/21/2014    Procedure: OPEN REDUCTION INTERNAL FIXATION (ORIF) LEFT CALCANEOUS FRACTURE;  Surgeon: Rozanna Box, MD;  Location: [place];  Service: Orthopedics;  Laterality: Left;  Marland Kitchen Kyphoplasty N/A 02/17/2015    Procedure: T12 KYPHOPLASTY;  Surgeon: Melina Schools, MD;  Location: [place];  Service: Orthopedics;  Laterality: N/A;  . Colonoscopy    . Hardware removal Left 07/29/2015    Procedure: HARDWARE REMOVAL,LEFT HEEL;  Surgeon: Altamese Park, MD;  Location: [place];  Service: Orthopedics;  Laterality: Left;  . Steriod injection Right 07/29/2015    Procedure: Subtalar Joint injection with Fluoro guidance ;   Surgeon: Altamese [place], MD;  Location: [place];  Service: Orthopedics;  Laterality: Right;    There were no vitals filed for this visit.  Visit Diagnosis:  Neck pain  Bilateral low back pain without sciatica  Stiffness of joints, multiple sites  Activity intolerance  Abnormal posture  Difficulty walking  Joint stiffness of spine      Subjective Assessment - 03/01/16 0811    Subjective Patient states she missed last appt b/c of a court date and her phone was broken.  Reports her upper back and shoulder hurts today.  Going to [place] next month for ESI.     Currently in Pain? Yes   Pain Score 4    Pain Location Shoulder   Pain Orientation Right   Pain Type Chronic pain   Pain Onset More than a month ago   Pain Score 4   Pain Location Back   Pain Type Chronic pain   Pain Frequency Constant            [facility] PT Assessment - 03/01/16 0001    AROM   Cervical Flexion 28  ext 30   Cervical - Right Side Bend 35   Cervical - Left Side Bend 35   Cervical - Right Rotation 25   Cervical - Left Rotation 30   Lumbar Extension 10   Lumbar - Right Side Bend 28  Lumbar - Left Side Bend 28                     [facility] Adult PT Treatment/Exercise - 03/01/16 0001    Knee/Hip Exercises: Aerobic   Stationary Bike L1 9 min   Knee/Hip Exercises: Standing   Other Standing Knee Exercises psoas door stretches, with UE reaches, UE opp reaches R/L 5 x each   Other Standing Knee Exercises wall planks 10x   Shoulder Exercises: Standing   Other Standing Exercises UE wall slides B with gluteal squeezes   Moist Heat Therapy   Number Minutes Moist Heat 5 Minutes   Moist Heat Location Cervical;Lumbar Spine   Electrical Stimulation   Electrical Stimulation Location B cervical    Electrical Stimulation Action IFC   Electrical Stimulation Parameters 9 ma  5 min (patient had to leave early secondary to transport)   Electrical Stimulation Goals Pain   Manual Therapy   Soft tissue  mobilization light soft tissue to bilateral lumbar paraspinals, rhomboids and upper traps   Myofascial Release B lumbar paraspinals and gluteals, piriformis          Trigger Point Dry Needling - 03/01/16 1102    Muscles Treated Upper Body --  B cervical multifidi   Muscles Treated Lower Body --  B lumbar multifidi   Levator Scapulae Response Twitch response elicited;Palpable increased muscle length   Rhomboids Response Palpable increased muscle length   Subscapularis Response Palpable increased muscle length      Right primarily.          PT Short Term Goals - 03/01/16 1300    PT SHORT TERM GOAL #1   Title I with initial HEP (01/09/16)   Status Achieved   PT SHORT TERM GOAL #2   Title decreased pain to 5/10 overall (01/09/16)   Status Achieved   PT SHORT TERM GOAL #3   Title improved cervical rotation by 20 degrees B (01/09/16)   Status Achieved           PT Long Term Goals - 03/01/16 1301    PT LONG TERM GOAL #1   Title I with advanced HEP   Time 8   Period Weeks   Status On-going   PT LONG TERM GOAL #2   Title Decreased pain overall by 50% since evaluation with ADLS   Time 8   Period Weeks   Status On-going   PT LONG TERM GOAL #3   Title Patient able to stand to do ADLs for 30 min before needing to sit.   Time 8   Period Weeks   Status On-going   PT LONG TERM GOAL #4   Title Patient able to walk for 30 min before needing stop for pain   Time 8   Period Weeks   Status On-going   PT LONG TERM GOAL #5   Title Able to do up and down stairs to bedroom 2x/day rather than sleeping on the couch   Time 8   Period Weeks   Status On-going               Plan - 03/01/16 1104    Clinical Impression Statement The patient expresses irritating pain in right posterior shoulder and neck today.  She states she is awaiting getting her prescription filled.  Multiple pain behaviors today.  She participates in low level ther ex with complaints of pain throughout,  she requests dry needling since this has decreased pain and increased ROM (  see AROM measurements).  E-stim/heat for pain control applied however after 5 min the patient's transportation arrives and treatment was discontinued.     PT Next Visit Plan continue functional reactivation;  modalities;  manual therapy as needed        Problem List Patient Active Problem List   Diagnosis Date Noted  . Varicose veins 01/13/2016  . Chronic hepatitis C virus infection ([place]) 12/15/2015  . Allergic rhinitis 12/09/2015  . Insomnia 12/09/2015  . Genital herpes 12/09/2015  . Hyperlipidemia 12/09/2015  . Menopausal symptom 12/09/2015  . Compression fracture of body of thoracic vertebra 02/17/2015  . Lumbar compression fracture, L1-2 endplate  624THL  . Thoracic compression fracture, T12 endplate  624THL  . Fracture of metatarsal bone of right foot, 4th neck 10/22/2014  . Has jumped from building 10/22/2014  . Bipolar 1 disorder ([place])   . Schizoaffective disorder ([place])   . Calcaneal fracture, Left  10/19/2014   Ruben Im, PT 03/01/2016 1:08 PM Phone: [phone] Fax: [phone] Alvera Singh 03/01/2016, 1:07 PM  [facility] [address], [place], [place], [address] Phone: [phone]   Fax:  [phone]  Name: Lauren Fuentes MRN: [id number] Date of Birth: 11-Jul-1966

## 2016-03-05 ENCOUNTER — Telehealth: Payer: Self-pay | Admitting: Family Medicine

## 2016-03-05 ENCOUNTER — Encounter: Payer: Self-pay | Admitting: Vascular Surgery

## 2016-03-05 DIAGNOSIS — B1920 Unspecified viral hepatitis C without hepatic coma: Secondary | ICD-10-CM | POA: Insufficient documentation

## 2016-03-05 NOTE — Telephone Encounter (Signed)
Referral written for [facility] for [facility].   Laroy Apple, MD [facility] 03/05/2016, 2:57 PM

## 2016-03-05 NOTE — Telephone Encounter (Signed)
Patient would like to know who you recommend she see for her Hepatitis C and would like of [place] to do a referral for her

## 2016-03-05 NOTE — Telephone Encounter (Signed)
Patient aware.

## 2016-03-06 ENCOUNTER — Encounter: Payer: Medicare Other | Admitting: Physical Therapy

## 2016-03-09 ENCOUNTER — Ambulatory Visit (INDEPENDENT_AMBULATORY_CARE_PROVIDER_SITE_OTHER): Payer: Medicare Other | Admitting: Vascular Surgery

## 2016-03-09 ENCOUNTER — Ambulatory Visit (HOSPITAL_COMMUNITY)
Admission: RE | Admit: 2016-03-09 | Discharge: 2016-03-09 | Disposition: A | Discharge status: Home / Self Care | Payer: Medicare Other | Source: Ambulatory Visit | Attending: Vascular Surgery | Admitting: Vascular Surgery

## 2016-03-09 ENCOUNTER — Encounter: Payer: Self-pay | Admitting: Vascular Surgery

## 2016-03-09 VITALS — BP 128/82 | HR 94 | Ht 63.5 in | Wt 193.5 lb

## 2016-03-09 DIAGNOSIS — R609 Edema, unspecified: Secondary | ICD-10-CM | POA: Diagnosis not present

## 2016-03-09 DIAGNOSIS — I83893 Varicose veins of bilateral lower extremities with other complications: Secondary | ICD-10-CM | POA: Insufficient documentation

## 2016-03-09 DIAGNOSIS — F319 Bipolar disorder, unspecified: Secondary | ICD-10-CM | POA: Diagnosis not present

## 2016-03-09 DIAGNOSIS — F259 Schizoaffective disorder, unspecified: Secondary | ICD-10-CM | POA: Insufficient documentation

## 2016-03-09 DIAGNOSIS — I872 Venous insufficiency (chronic) (peripheral): Secondary | ICD-10-CM | POA: Diagnosis not present

## 2016-03-09 DIAGNOSIS — K219 Gastro-esophageal reflux disease without esophagitis: Secondary | ICD-10-CM | POA: Diagnosis not present

## 2016-03-09 NOTE — Progress Notes (Deleted)
Referred by:  Claretta Fraise, MD [place], [place] [address]  Reason for referral: ***   History of Present Illness  Lauren Fuentes is a 50 y.o. (12-07-1966) female who presents with chief complaint: ***.  Patient notes, onset of swelling *** months ago, associated with ***.  The patient's symptoms include: ***.  The patient has had *** history of DVT, *** history of pregnancy, *** history of varicose vein, *** history of venous stasis ulcers, *** history of  Lymphedema and *** history of skin changes in lower legs.  There is *** family history of venous disorders.  The patient has *** used compression stockings in the past.   Past Medical History  Diagnosis Date  . Bipolar 1 disorder ([place])   . Schizoaffective disorder ([place])   . Post traumatic stress disorder (PTSD)   . Calcaneal fracture, Left  10/19/2014  . Thoracic compression fracture, T12 endplate  624THL  . Lumbar compression fracture, L1-2 endplate  624THL  . Fracture of metatarsal bone of right foot, 4th neck 10/22/2014  . Back pain, chronic   . GERD (gastroesophageal reflux disease)   . Arthritis   . Headache     hx migraines, tension headaches  . Hepatitis     stated in the past - pt denies this as of 07/28/15)    Past Surgical History  Procedure Laterality Date  . Tubal ligation    . Orif calcaneous fracture Left 10/21/2014    Procedure: OPEN REDUCTION INTERNAL FIXATION (ORIF) LEFT CALCANEOUS FRACTURE;  Surgeon: Rozanna Box, MD;  Location: [place];  Service: Orthopedics;  Laterality: Left;  Marland Kitchen Kyphoplasty N/A 02/17/2015    Procedure: T12 KYPHOPLASTY;  Surgeon: Melina Schools, MD;  Location: [place];  Service: Orthopedics;  Laterality: N/A;  . Colonoscopy    . Hardware removal Left 07/29/2015    Procedure: HARDWARE REMOVAL,LEFT HEEL;  Surgeon: Altamese [place], MD;  Location: [place];  Service: Orthopedics;  Laterality: Left;  . Steriod injection Right 07/29/2015    Procedure: Subtalar Joint injection with  Fluoro guidance ;  Surgeon: Altamese Chadwicks, MD;  Location: [place];  Service: Orthopedics;  Laterality: Right;    Social History   Social History  . Marital Status: Divorced    Spouse Name: N/A  . Number of Children: N/A  . Years of Education: N/A   Occupational History  . Not on file.   Social History Main Topics  . Smoking status: Former Smoker -- 0.25 packs/day for 18 years    Types: Cigarettes    Quit date: 04/27/2015  . Smokeless tobacco: Never Used  . Alcohol Use: Yes     Comment: rare  . Drug Use: Yes    Special: Cocaine     Comment: last use 09/2014   . Sexual Activity: Not on file   Other Topics Concern  . Not on file   Social History Narrative   ** Merged History Encounter **        *** Family History  Problem Relation Age of Onset  . Hypertension Mother   . Bipolar disorder Mother   . Hypertension Father   . Cancer Father     Current Outpatient Prescriptions  Medication Sig Dispense Refill  . busPIRone (BUSPAR) 5 MG tablet Take 5 mg by mouth 3 (three) times daily.    . cycloSPORINE (RESTASIS) 0.05 % ophthalmic emulsion 1 drop 2 (two) times daily.    Marland Kitchen estradiol-norethindrone (ACTIVELLA) 1-0.5 MG tablet Take 1 tablet by  mouth daily. 30 tablet 12  . flunisolide (NASALIDE) 25 MCG/ACT (0.025%) SOLN Place 2 sprays into the nose daily. 1 Bottle 11  . FLUoxetine (PROZAC) 40 MG capsule Take 40 mg by mouth daily.    Marland Kitchen ibuprofen (ADVIL,MOTRIN) 800 MG tablet Take 1 tablet (800 mg total) by mouth every 8 (eight) hours as needed for headache or moderate pain. 30 tablet 1  . lamoTRIgine (LAMICTAL) 100 MG tablet Take 100 mg by mouth 2 (two) times daily.    Marland Kitchen loxapine (LOXITANE) 10 MG capsule Take 10 mg by mouth 3 (three) times daily.     . methocarbamol (ROBAXIN) 500 MG tablet Take 1 tablet (500 mg total) by mouth 4 (four) times daily. To relax muscles 120 tablet 2  . Polyethyl Glycol-Propyl Glycol (SYSTANE FREE OP) Apply to eye.    . traZODone (DESYREL) 150 MG tablet  Take 300 mg by mouth at bedtime.    . valACYclovir (VALTREX) 500 MG tablet Take 1 tablet (500 mg total) by mouth 2 (two) times daily. 60 tablet 1  . Vitamin D, Ergocalciferol, (DRISDOL) 50000 UNITS CAPS capsule Take 1 capsule (50,000 Units total) by mouth every 7 (seven) days. 12 capsule 3   No current facility-administered medications for this visit.    No Known Allergies   ***REVIEW OF SYSTEMS:  (Positives checked otherwise negative)  CARDIOVASCULAR:   [ ]  chest pain,  [ ]  chest pressure,  [ ]  palpitations,  [ ]  shortness of breath when laying flat,  [ ]  shortness of breath with exertion,   [ ]  pain in feet when walking,  [ ]  pain in feet when laying flat, [ ]  history of blood clot in veins (DVT),  [ ]  history of phlebitis,  [ ]  swelling in legs,  [ ]  varicose veins  PULMONARY:   [ ]  productive cough,  [ ]  asthma,  [ ]  wheezing  NEUROLOGIC:   [ ]  weakness in arms or legs,  [ ]  numbness in arms or legs,  [ ]  difficulty speaking or slurred speech,  [ ]  temporary loss of vision in one eye,  [ ]  dizziness  HEMATOLOGIC:   [ ]  bleeding problems,  [ ]  problems with blood clotting too easily  MUSCULOSKEL:   [ ]  joint pain, [ ]  joint swelling  GASTROINTEST:   [ ]  vomiting blood,  [ ]  blood in stool     GENITOURINARY:   [ ]  burning with urination,  [ ]  blood in urine  PSYCHIATRIC:   [ ]  history of major depression  INTEGUMENTARY:   [ ]  rashes,  [ ]  ulcers  CONSTITUTIONAL:   [ ]  fever,  [ ]  chills   ***Physical Examination  There were no vitals filed for this visit. There is no weight on file to calculate BMI.  General: A&O x 3, WD***, Obese, ***, Cachectic, ***, Ill appear, ***, Somulent,  Head: [place]/AT***, Temporalis wasting, ***, Prominent temp pulse,  Ear/Nose/Throat: Hearing grossly intact, nares without erythema or drainage, oropharynx without Erythema/Exudate, Mallampati score: ***, ***, Hearing loss, ***, Nasal drainage,  ***, Dental caries ***,  OP w/ erythema / exudate,  Eyes: PERRLA, EOMI***, Post surg chg to pupils, ***, Unable to coop w/ exam,  Neck: Supple, *** nuchal rigidity, *** palpable LAD  Pulmonary: Sym exp, good air movt, CTAB, no rales, rhonchi, & wheezing***, + rales, ***, + rhonchi, ***, + wheezing,   Cardiac: RRR, Nl S1, S2, no Murmurs, rubs or gallops***, S3/S4, ***, Irregularly, irregular rhythm and  rate,  Vascular: Vessel Right Left  Radial ***Palpable ***Palpable  Brachial ***Palpable ***Palpable  Carotid Palpable, with***out bruit Palpable, with***out bruit  Aorta Not palpable N/A  Femoral ***Palpable ***Palpable  Popliteal Not palpable Not palpable  PT ***Palpable ***Palpable  DP ***Palpable ***Palpable   Gastrointestinal: soft, NTND, no G/R, no HSM, no masses, no CVAT B***, + AAA , ***, Surg. Inc TTP: (RUQ / RLQ / LUQ / LLQ / Epigastric), ***, +G / +R,  Musculoskeletal: M/S 5/5 throughout *** except ***, Extremities without ischemic changes *** except  ***, ***LDS, ***edema  Neurologic: CN 2-12 intact *** except ***, Pain and light touch intact in extremities *** except ***, Motor exam as listed above  Psychiatric: Judgment intact, Mood & affect appropriate for pt's clinical situation***, ***, + MDD, ***, + Bipolar, ***, + Poor judgement,  Dermatologic: See M/S exam for extremity exam, no rashes otherwise noted  Lymph : No Cervical, Axillary, or Inguinal lymphadenopathy *** except ***   Non-Invasive Vascular Imaging  BLE Venous Insufficiency Duplex (Date: 03/09/2016):   RLE:   *** DVT and SVT,   *** GSV reflux,   *** SSV reflux,  *** deep venous reflux  LLE:  *** DVT and SVT,   *** GSV reflux,   *** SSV reflux,  *** deep venous reflux   Outside Studies/Documentation *** pages of outside documents were reviewed including: ***.   Medical Decision Making  Nylani Rochell is a 50 y.o. female who presents with: ***LE chronic venous insufficiency (C***), ***varicose veins with  complications   Based on the patient's history and examination, I recommend: ***.  I discussed with the patient the use of her 20-30 mm thigh high compression stockings and need for 3 month trial of such.  The patient will follow up in 3 months with my partners in the [facility] for evaluation for: ***.  Thank you for allowing [place] to participate in this patient's care.   Adele Barthel, MD [facility] Office: [phone] Pager: [phone]  03/09/2016, 8:53 AM

## 2016-03-09 NOTE — Progress Notes (Signed)
Referred by: Claretta Fraise, MD [place], [place] [address]   Reason for referral: Swollen right > left leg  History of Present Illness  Lauren Fuentes is a 50 y.o. female who presents with chief complaint: swollen leg.  Patient notes, onset of swelling > 2 years  ago, associated with ambulation.  The patient has had no history of DVT, no history of varicose vein, no history of venous stasis ulcers, no history of  Lymphedema and no history of skin changes in lower legs.  There is a family history of venous disorders.  The patient has not used compression stockings in the past.  She states she is having pain and heaviness with shorter and shorter walking distance.  She has to stop and rest.    Past Medical History  Diagnosis Date  . Bipolar 1 disorder ([place])   . Schizoaffective disorder ([place])   . Post traumatic stress disorder (PTSD)   . Calcaneal fracture, Left  10/19/2014  . Thoracic compression fracture, T12 endplate  624THL  . Lumbar compression fracture, L1-2 endplate  624THL  . Fracture of metatarsal bone of right foot, 4th neck 10/22/2014  . Back pain, chronic   . GERD (gastroesophageal reflux disease)   . Arthritis   . Headache     hx migraines, tension headaches  . Hepatitis     stated in the past - pt denies this as of 07/28/15)    Past Surgical History  Procedure Laterality Date  . Tubal ligation    . Orif calcaneous fracture Left 10/21/2014    Procedure: OPEN REDUCTION INTERNAL FIXATION (ORIF) LEFT CALCANEOUS FRACTURE;  Surgeon: Rozanna Box, MD;  Location: [facility];  Service: Orthopedics;  Laterality: Left;  Marland Kitchen Kyphoplasty N/A 02/17/2015    Procedure: T12 KYPHOPLASTY;  Surgeon: Melina Schools, MD;  Location: [facility];  Service: Orthopedics;  Laterality: N/A;  . Colonoscopy    . Hardware removal Left 07/29/2015    Procedure: HARDWARE REMOVAL,LEFT HEEL;  Surgeon: Altamese Waterville, MD;  Location: [facility];  Service: Orthopedics;  Laterality: Left;  . Steriod  injection Right 07/29/2015    Procedure: Subtalar Joint injection with Fluoro guidance ;  Surgeon: Altamese [place], MD;  Location: [facility];  Service: Orthopedics;  Laterality: Right;    Social History   Social History  . Marital Status: Divorced    Spouse Name: N/A  . Number of Children: N/A  . Years of Education: N/A   Occupational History  . Not on file.   Social History Main Topics  . Smoking status: Current Some Day Smoker -- 0.25 packs/day for 18 years    Types: Cigarettes    Last Attempt to Quit: 04/27/2015  . Smokeless tobacco: Never Used  . Alcohol Use: 0.0 oz/week    0 Standard drinks or equivalent per week     Comment: rare  . Drug Use: Yes    Special: Cocaine     Comment: last use 09/2014   . Sexual Activity: Not on file   Other Topics Concern  . Not on file   Social History Narrative   ** Merged History Encounter **        Family History  Problem Relation Age of Onset  . Hypertension Mother   . Bipolar disorder Mother   . Hypertension Father   . Cancer Father     Current Outpatient Prescriptions on File Prior to Visit  Medication Sig Dispense Refill  . busPIRone (BUSPAR) 5 MG tablet Take  10 mg by mouth 3 (three) times daily.     . cycloSPORINE (RESTASIS) 0.05 % ophthalmic emulsion 1 drop 2 (two) times daily.    Marland Kitchen estradiol-norethindrone (ACTIVELLA) 1-0.5 MG tablet Take 1 tablet by mouth daily. 30 tablet 12  . flunisolide (NASALIDE) 25 MCG/ACT (0.025%) SOLN Place 2 sprays into the nose daily. 1 Bottle 11  . FLUoxetine (PROZAC) 40 MG capsule Take 40 mg by mouth daily.    Marland Kitchen ibuprofen (ADVIL,MOTRIN) 800 MG tablet Take 1 tablet (800 mg total) by mouth every 8 (eight) hours as needed for headache or moderate pain. 30 tablet 1  . lamoTRIgine (LAMICTAL) 100 MG tablet Take 100 mg by mouth 2 (two) times daily.    Marland Kitchen loxapine (LOXITANE) 10 MG capsule Take 10 mg by mouth 3 (three) times daily.     Vladimir Faster Glycol-Propyl Glycol (SYSTANE FREE OP) Apply to eye.    .  traZODone (DESYREL) 150 MG tablet Take 300 mg by mouth at bedtime.    . valACYclovir (VALTREX) 500 MG tablet Take 1 tablet (500 mg total) by mouth 2 (two) times daily. 60 tablet 1  . Vitamin D, Ergocalciferol, (DRISDOL) 50000 UNITS CAPS capsule Take 1 capsule (50,000 Units total) by mouth every 7 (seven) days. 12 capsule 3  . methocarbamol (ROBAXIN) 500 MG tablet Take 1 tablet (500 mg total) by mouth 4 (four) times daily. To relax muscles (Patient not taking: Reported on 03/09/2016) 120 tablet 2   No current facility-administered medications on file prior to visit.    Allergies as of 03/09/2016  . (No Known Allergies)     ROS:   General:  No weight loss, Fever, chills  HEENT: No recent headaches, no nasal bleeding, no visual changes, no sore throat  Neurologic: No dizziness, blackouts, seizures. No recent symptoms of stroke or mini- stroke. No recent episodes of slurred speech, or temporary blindness.  Cardiac: No recent episodes of chest pain/pressure, no shortness of breath at rest.  No shortness of breath with exertion.  Denies history of atrial fibrillation or irregular heartbeat  Vascular: No history of rest pain in feet.  No history of claudication.  No history of non-healing ulcer, No history of DVT   Pulmonary: No home oxygen, no productive cough, no hemoptysis,  No asthma or wheezing  Musculoskeletal:  [x ] Arthritis, [ x] Low back pain,  [x ] Joint pain  Hematologic:No history of hypercoagulable state.  No history of easy bleeding.  No history of anemia  Gastrointestinal: No hematochezia or melena,  No gastroesophageal reflux, no trouble swallowing  Urinary: [ ]  chronic Kidney disease, [ ]  on HD - [ ]  MWF or [ ]  TTHS, [ ]  Burning with urination, [ ]  Frequent urination, [ ]  Difficulty urinating;   Skin: No rashes  Psychological: No history of anxiety,  positive history of depression  Physical Examination  Filed Vitals:   03/09/16 1455  BP: 128/82  Pulse: 94   Height: 5' 3.5" (1.613 m)  Weight: 193 lb 8 oz (87.771 kg)  SpO2: 99%    Body mass index is 33.74 kg/(m^2).  General:  Alert and oriented, no acute distress HEENT: Normal Neck: No bruit or JVD Pulmonary: Clear to auscultation bilaterally Cardiac: Regular Rate and Rhythm without murmur Abdomen: Soft, non-tender, non-distended, no mass, no scars Skin: No rash Extremity Pulses:  2+ radial, brachial, femoral, dorsalis pedis, posterior tibial pulses bilaterally Musculoskeletal: No deformity or edema  Neurologic: Upper and lower extremity motor 5/5 and symmetric Psychiatric: Judgment  intact, Mood & affect appropriate for pt's clinical situation Lymph : No Cervical, Axillary, or Inguinal lymphadenopathy    DATA: Venous reflux study right GSV positive for reflux: 4.2-5.5 mm No DVT, positive reflux bilateral CFV  Assessment: Varicose veins with complications, chronic venous insufficiency   Plan: Compression thigh high stockings daily for 3 months.  Then she will f/u in the vein clinic for evaluation and possible laser ablation.    Theda Sers, Charice Zuno [facility] PA-C [facility] [place] Office: [phone]  Addendum  I have independently interviewed and examined the patient, and I agree with the physician assistant's findings.  Pt has R>>L sx.  Her venous reflux studies are consistent with such, so she might benefit from R GSV EVLA  If compressive therapy is inadequate.  Adele Barthel, MD [facility] [place] Office: [phone] Pager: [phone]  03/09/2016, 3:45 PM

## 2016-03-13 ENCOUNTER — Ambulatory Visit: Payer: Medicare Other | Admitting: Physical Therapy

## 2016-03-13 DIAGNOSIS — R293 Abnormal posture: Secondary | ICD-10-CM

## 2016-03-13 DIAGNOSIS — M545 Low back pain, unspecified: Secondary | ICD-10-CM

## 2016-03-13 DIAGNOSIS — M542 Cervicalgia: Secondary | ICD-10-CM | POA: Diagnosis not present

## 2016-03-13 DIAGNOSIS — R6889 Other general symptoms and signs: Secondary | ICD-10-CM | POA: Diagnosis not present

## 2016-03-13 DIAGNOSIS — R262 Difficulty in walking, not elsewhere classified: Secondary | ICD-10-CM | POA: Diagnosis not present

## 2016-03-13 DIAGNOSIS — M2560 Stiffness of unspecified joint, not elsewhere classified: Secondary | ICD-10-CM

## 2016-03-13 NOTE — Therapy (Signed)
[facility] [address] [place], [place], [address] Phone: [phone]   Fax:  [phone]  Physical Therapy Treatment  Patient Details  Name: Lauren Fuentes MRN: [id number] Date of Birth: May 12, 1966 Referring Provider: Claretta Fraise MD  Encounter Date: 03/13/2016      PT End of Session - 03/13/16 1615    Visit Number 15   Number of Visits 20   Date for PT Re-Evaluation 04/03/16   Authorization Type G code at visit 20;    PT Start Time 1117   PT Stop Time 1210   PT Time Calculation (min) 53 min   Activity Tolerance Patient tolerated treatment well      Past Medical History  Diagnosis Date  . Bipolar 1 disorder ([place])   . Schizoaffective disorder ([place])   . Post traumatic stress disorder (PTSD)   . Calcaneal fracture, Left  10/19/2014  . Thoracic compression fracture, T12 endplate  624THL  . Lumbar compression fracture, L1-2 endplate  624THL  . Fracture of metatarsal bone of right foot, 4th neck 10/22/2014  . Back pain, chronic   . GERD (gastroesophageal reflux disease)   . Arthritis   . Headache     hx migraines, tension headaches  . Hepatitis     stated in the past - pt denies this as of 07/28/15)    Past Surgical History  Procedure Laterality Date  . Tubal ligation    . Orif calcaneous fracture Left 10/21/2014    Procedure: OPEN REDUCTION INTERNAL FIXATION (ORIF) LEFT CALCANEOUS FRACTURE;  Surgeon: Rozanna Box, MD;  Location: [facility];  Service: Orthopedics;  Laterality: Left;  Marland Kitchen Kyphoplasty N/A 02/17/2015    Procedure: T12 KYPHOPLASTY;  Surgeon: Melina Schools, MD;  Location: [facility];  Service: Orthopedics;  Laterality: N/A;  . Colonoscopy    . Hardware removal Left 07/29/2015    Procedure: HARDWARE REMOVAL,LEFT HEEL;  Surgeon: Altamese Alamo, MD;  Location: [facility];  Service: Orthopedics;  Laterality: Left;  . Steriod injection Right 07/29/2015    Procedure: Subtalar Joint injection with Fluoro  guidance ;  Surgeon: Altamese Milliken, MD;  Location: [facility];  Service: Orthopedics;  Laterality: Right;    There were no vitals filed for this visit.  Visit Diagnosis:  Neck pain  Bilateral low back pain without sciatica  Stiffness of joints, multiple sites  Activity intolerance  Abnormal posture  Difficulty walking      Subjective Assessment - 03/13/16 1114    Subjective Patient arrives 13 min late (transportation late).  I've not been hurting.  States she walked to the park.     Currently in Pain? Yes   Pain Score 1    Pain Location Back   Pain Score 1   Pain Location Neck                         [facility] Adult PT Treatment/Exercise - 03/13/16 0001    Knee/Hip Exercises: Aerobic   Nustep L1 6 min   Knee/Hip Exercises: Standing   Lateral Step Up Right;Left;1 set;10 reps;Hand Hold: 1;Step Height: 6"   Other Standing Knee Exercises standing on rebounder weight shift 3 ways 3 min   Other Standing Knee Exercises wall planks 10x   Knee/Hip Exercises: Seated   Sit to Sand 10 reps   Shoulder Exercises: Standing   Extension Strengthening;Both;10 reps   Theraband Level (Shoulder Extension) Level 1 (Yellow)   Row Strengthening;Both;10 reps;Theraband   Theraband Level (  Shoulder Row) Level 1 (Yellow)   Moist Heat Therapy   Number Minutes Moist Heat 15 Minutes   Moist Heat Location Cervical;Lumbar Spine   Electrical Stimulation   Electrical Stimulation Location B lumbar   Electrical Stimulation Action IFC   Electrical Stimulation Parameters 9 ma 15 min   Electrical Stimulation Goals Pain          Trigger Point Dry Needling - 03/13/16 1139    Muscles Treated Lower Body --  lumbar multifidi B   Upper Trapezius Response Twitch reponse elicited;Palpable increased muscle length   Levator Scapulae Response Twitch response elicited;Palpable increased muscle length                PT Short Term Goals - 03/13/16 1620    PT SHORT TERM GOAL #1   Title I  with initial HEP (01/09/16)   Status Achieved   PT SHORT TERM GOAL #2   Title decreased pain to 5/10 overall (01/09/16)   Status Achieved   PT SHORT TERM GOAL #3   Title improved cervical rotation by 20 degrees B (01/09/16)   Status Achieved           PT Long Term Goals - 03/13/16 1620    PT LONG TERM GOAL #1   Title I with advanced HEP   Time 8   Period Weeks   Status On-going   PT LONG TERM GOAL #2   Title Decreased pain overall by 50% since evaluation with ADLS   Time 8   Period Weeks   Status Achieved   PT LONG TERM GOAL #3   Title Patient able to stand to do ADLs for 30 min before needing to sit.   Time 8   Period Weeks   Status On-going   PT LONG TERM GOAL #4   Title Patient able to walk for 30 min before needing stop for pain   Time 8   Period Weeks   Status On-going   PT LONG TERM GOAL #5   Title Able to do up and down stairs to bedroom 2x/day rather than sleeping on the couch   Status Achieved   Additional Long Term Goals   Additional Long Term Goals Yes               Plan - 03/13/16 1616    Clinical Impression Statement The patient has dramatic improvements in pain intensity ratings and is now able to participate in low level exercise with fewer pain behaviors.  She reports good pain relief with dry needling, manual therapy and modalities.  Decreased tender point size and number and much improved soft tissue lengths.  Therapist closely monitoring response during all treatment interventions.  Progressing with LTGs.     PT Next Visit Plan continue functional reactivation;  modalities;  manual therapy as needed ;  check walking tolerance;  KX        Problem List Patient Active Problem List   Diagnosis Date Noted  . Chronic venous insufficiency 03/09/2016  . Hepatitis C 03/05/2016  . Varicose veins of lower extremities with complications 123456  . Chronic hepatitis C virus infection ([place]) 12/15/2015  . Allergic rhinitis 12/09/2015  . Insomnia  12/09/2015  . Genital herpes 12/09/2015  . Hyperlipidemia 12/09/2015  . Menopausal symptom 12/09/2015  . Compression fracture of body of thoracic vertebra 02/17/2015  . Lumbar compression fracture, L1-2 endplate  624THL  . Thoracic compression fracture, T12 endplate  624THL  . Fracture of metatarsal bone of right foot,  4th neck 10/22/2014  . Has jumped from building 10/22/2014  . Bipolar 1 disorder ([place])   . Schizoaffective disorder ([place])   . Calcaneal fracture, Left  10/19/2014    Alvera Singh 03/13/2016, 4:23 PM  [facility] [address], [place], [place], [address] Phone: [phone]   Fax:  [phone]  Name: Azaya Mccoskey MRN: [id number] Date of Birth: 08-May-1966    Ruben Im, PT 03/13/2016 4:23 PM Phone: [phone] Fax: [phone]

## 2016-03-15 ENCOUNTER — Ambulatory Visit: Payer: Medicare Other | Admitting: Physical Therapy

## 2016-03-15 DIAGNOSIS — R6889 Other general symptoms and signs: Secondary | ICD-10-CM

## 2016-03-15 DIAGNOSIS — M545 Low back pain, unspecified: Secondary | ICD-10-CM

## 2016-03-15 DIAGNOSIS — M2560 Stiffness of unspecified joint, not elsewhere classified: Secondary | ICD-10-CM

## 2016-03-15 DIAGNOSIS — M542 Cervicalgia: Secondary | ICD-10-CM

## 2016-03-15 DIAGNOSIS — H1013 Acute atopic conjunctivitis, bilateral: Secondary | ICD-10-CM | POA: Diagnosis not present

## 2016-03-15 DIAGNOSIS — R262 Difficulty in walking, not elsewhere classified: Secondary | ICD-10-CM

## 2016-03-15 DIAGNOSIS — R293 Abnormal posture: Secondary | ICD-10-CM | POA: Diagnosis not present

## 2016-03-16 ENCOUNTER — Ambulatory Visit (INDEPENDENT_AMBULATORY_CARE_PROVIDER_SITE_OTHER): Payer: Medicare Other | Admitting: Family Medicine

## 2016-03-16 ENCOUNTER — Encounter: Payer: Self-pay | Admitting: Family Medicine

## 2016-03-16 VITALS — BP 110/64 | HR 89 | Temp 97.6°F | Ht 63.5 in | Wt 192.2 lb

## 2016-03-16 DIAGNOSIS — F1910 Other psychoactive substance abuse, uncomplicated: Secondary | ICD-10-CM | POA: Diagnosis not present

## 2016-03-16 NOTE — Therapy (Signed)
[facility] [address] [place], [place], [address] Phone: [phone]   Fax:  [phone]  Physical Therapy Treatment  Patient Details  Name: Lauren Fuentes MRN: [id number] Date of Birth: 08/16/1966 Referring Provider: Claretta Fraise MD  Encounter Date: 03/15/2016      PT End of Session - 03/16/16 0747    Visit Number 16   Number of Visits 20   Date for PT Re-Evaluation 04/03/16   Authorization Type G code at visit 20;    PT Start Time 0930   PT Stop Time 1030   PT Time Calculation (min) 60 min   Activity Tolerance Patient tolerated treatment well      Past Medical History  Diagnosis Date  . Bipolar 1 disorder ([place])   . Schizoaffective disorder ([place])   . Post traumatic stress disorder (PTSD)   . Calcaneal fracture, Left  10/19/2014  . Thoracic compression fracture, T12 endplate  624THL  . Lumbar compression fracture, L1-2 endplate  624THL  . Fracture of metatarsal bone of right foot, 4th neck 10/22/2014  . Back pain, chronic   . GERD (gastroesophageal reflux disease)   . Arthritis   . Headache     hx migraines, tension headaches  . Hepatitis     stated in the past - pt denies this as of 07/28/15)    Past Surgical History  Procedure Laterality Date  . Tubal ligation    . Orif calcaneous fracture Left 10/21/2014    Procedure: OPEN REDUCTION INTERNAL FIXATION (ORIF) LEFT CALCANEOUS FRACTURE;  Surgeon: Rozanna Box, MD;  Location: [facility];  Service: Orthopedics;  Laterality: Left;  Marland Kitchen Kyphoplasty N/A 02/17/2015    Procedure: T12 KYPHOPLASTY;  Surgeon: Melina Schools, MD;  Location: [facility];  Service: Orthopedics;  Laterality: N/A;  . Colonoscopy    . Hardware removal Left 07/29/2015    Procedure: HARDWARE REMOVAL,LEFT HEEL;  Surgeon: Altamese Nucla, MD;  Location: [facility];  Service: Orthopedics;  Laterality: Left;  . Steriod injection Right 07/29/2015    Procedure: Subtalar Joint injection with Fluoro  guidance ;  Surgeon: Altamese [place], MD;  Location: [facility];  Service: Orthopedics;  Laterality: Right;    There were no vitals filed for this visit.  Visit Diagnosis:  Neck pain  Bilateral low back pain without sciatica  Stiffness of joints, multiple sites  Activity intolerance  Abnormal posture  Difficulty walking  Joint stiffness of spine      Subjective Assessment - 03/15/16 0930    Subjective I'm going up the stairs great!   Denies soreness after last visit.  Just some stiffness in the spine.  I haven't done much walking lately, it's too cold.     Currently in Pain? Yes   Pain Score 3    Pain Location Back   Pain Orientation Upper;Lower;Mid                         [facility] Adult PT Treatment/Exercise - 03/15/16 0001    Knee/Hip Exercises: Aerobic   Recumbent Bike L1 6 min   Knee/Hip Exercises: Standing   Heel Raises Both;1 set;10 reps   Other Standing Knee Exercises standing 3 ways 10x R/L   Other Standing Knee Exercises psoas doorway stretch;  wall exercises 10x each with UE reaches   Shoulder Exercises: Standing   Other Standing Exercises UE wall slides B with gluteal squeezes   Moist Heat Therapy   Number Minutes Moist Heat 15  Minutes   Moist Heat Location Cervical;Lumbar Spine   Acupuncturist Location B lumbar   Electrical Stimulation Action IFC   Electrical Stimulation Parameters 9 ma   Electrical Stimulation Goals Pain   Manual Therapy   Soft tissue mobilization light soft tissue to bilateral lumbar paraspinals, rhomboids and upper traps   Myofascial Release B lumbar paraspinals and gluteals, piriformis          Trigger Point Dry Needling - 03/15/16 0948    Consent Given? Yes   Muscles Treated Upper Body Rhomboids  cervical multifidi   Muscles Treated Lower Body --  lumbar multifidi   Rhomboids Response Palpable increased muscle length      Performed bilaterally.          PT Short Term  Goals - 03/16/16 0750    PT SHORT TERM GOAL #1   Title I with initial HEP (01/09/16)   Status Achieved   PT SHORT TERM GOAL #2   Title decreased pain to 5/10 overall (01/09/16)   Status Achieved   PT SHORT TERM GOAL #3   Title improved cervical rotation by 20 degrees B (01/09/16)   Status Achieved           PT Long Term Goals - 03/16/16 0750    PT LONG TERM GOAL #1   Title I with advanced HEP   Time 8   Period Weeks   Status On-going   PT LONG TERM GOAL #2   Title Decreased pain overall by 50% since evaluation with ADLS   Status Achieved   PT LONG TERM GOAL #3   Title Patient able to stand to do ADLs for 30 min before needing to sit.   Time 8   Period Weeks   Status On-going   PT LONG TERM GOAL #4   Title Patient able to walk for 30 min before needing stop for pain   Time 8   Period Weeks   Status On-going   PT LONG TERM GOAL #5   Title Able to do up and down stairs to bedroom 2x/day rather than sleeping on the couch   Status Achieved               Plan - 03/16/16 0748    Clinical Impression Statement The patient is improving with functional reactivation with fewer pain behaviors.  States she can go up and down the steps at home much better now.  Improved soft tissue length.  Verbal and tactile cues needed for abdominal brace and postural alignment.  Tends to stand with knees hyperextended, hips forward.  Progressing with goals.     PT Next Visit Plan continue functional reactivation;  modalities;  manual therapy as needed ;  check walking tolerance;  KX        Problem List Patient Active Problem List   Diagnosis Date Noted  . Chronic venous insufficiency 03/09/2016  . Hepatitis C 03/05/2016  . Varicose veins of lower extremities with complications 123456  . Chronic hepatitis C virus infection ([place]) 12/15/2015  . Allergic rhinitis 12/09/2015  . Insomnia 12/09/2015  . Genital herpes 12/09/2015  . Hyperlipidemia 12/09/2015  . Menopausal symptom  12/09/2015  . Compression fracture of body of thoracic vertebra 02/17/2015  . Lumbar compression fracture, L1-2 endplate  624THL  . Thoracic compression fracture, T12 endplate  624THL  . Fracture of metatarsal bone of right foot, 4th neck 10/22/2014  . Has jumped from building 10/22/2014  . Bipolar 1 disorder (  [place])   . Schizoaffective disorder ([place])   . Calcaneal fracture, Left  10/19/2014    Ruben Im C 03/16/2016, 7:52 AM  [facility] [address] [place], [place], [address] Phone: [phone]   Fax:  [phone]  Name: Dejahnae Holster MRN: [id number] Date of Birth: 09-19-1966   Ruben Im, PT 03/16/2016 7:54 AM Phone: [phone] Fax: [phone]

## 2016-03-16 NOTE — Progress Notes (Signed)
   HPI  Patient presents today here to discuss DMV paperwork.  Patient has a history of substance abuse and has to get annual paperwork filled out for the [facility] to continue driving. She has no history of DUI or recent accident. Her last drink was 12/05/2015, the last time that she used crack cocaine was September 2015.  She's been attending substance abuse treatment, they have a that I reviewed today. She requests drug testing, she states that she struck tested weekly at her substance abuse treatment facility  PMH: Smoking status noted ROS: Per HPI  Objective: BP 110/64 mmHg  Pulse 89  Temp(Src) 97.6 F (36.4 C) (Oral)  Ht 5' 3.5" (1.613 m)  Wt 192 lb 3.2 oz (87.181 kg)  BMI 33.51 kg/m2 Gen: NAD, alert, cooperative with exam HEENT: NCAT CV: RRR, good S1/S2, no murmur Resp: CTABL, no wheezes, non-labored Ext: No edema, warm Neuro: Alert and oriented, No gross deficits  Assessment and plan:  # Substance abuse Urine drug screen, I encouraged her that I didn't think this was necessary as she is seemingly very reliable and very involved in a substance abuse program, however she insisted. Filled out DMV paperwork today no limits  She has infectious disease schedule to discuss hepatitis C    Orders Placed This Encounter  Procedures  . ToxASSURE Select 13 (MW), Urine     Laroy Apple, MD [facility] 03/16/2016, 4:07 PM

## 2016-03-20 ENCOUNTER — Ambulatory Visit: Payer: Medicare Other | Admitting: Physical Therapy

## 2016-03-20 DIAGNOSIS — R6889 Other general symptoms and signs: Secondary | ICD-10-CM | POA: Diagnosis not present

## 2016-03-20 DIAGNOSIS — R262 Difficulty in walking, not elsewhere classified: Secondary | ICD-10-CM | POA: Diagnosis not present

## 2016-03-20 DIAGNOSIS — M542 Cervicalgia: Secondary | ICD-10-CM

## 2016-03-20 DIAGNOSIS — R293 Abnormal posture: Secondary | ICD-10-CM

## 2016-03-20 DIAGNOSIS — M545 Low back pain, unspecified: Secondary | ICD-10-CM

## 2016-03-20 DIAGNOSIS — M2560 Stiffness of unspecified joint, not elsewhere classified: Secondary | ICD-10-CM | POA: Diagnosis not present

## 2016-03-20 NOTE — Therapy (Signed)
[facility] [address], [place], [place], [address] Phone: [phone]   Fax:  [phone]  Physical Therapy Treatment  Patient Details  Name: Lauren Fuentes MRN: [id number] Date of Birth: 08/22/1966 Referring Provider: Claretta Fraise MD  Encounter Date: 03/20/2016      PT End of Session - 03/20/16 1012    Visit Number 17   Number of Visits 20   Date for PT Re-Evaluation 04/03/16   Authorization Type G code at visit 20;    PT Start Time 0920   PT Stop Time 1020   PT Time Calculation (min) 60 min   Activity Tolerance Patient tolerated treatment well      Past Medical History  Diagnosis Date  . Bipolar 1 disorder ([place])   . Schizoaffective disorder ([place])   . Post traumatic stress disorder (PTSD)   . Calcaneal fracture, Left  10/19/2014  . Thoracic compression fracture, T12 endplate  63/81/7711  . Lumbar compression fracture, L1-2 endplate  65/79/0383  . Fracture of metatarsal bone of right foot, 4th neck 10/22/2014  . Back pain, chronic   . GERD (gastroesophageal reflux disease)   . Arthritis   . Headache     hx migraines, tension headaches  . Hepatitis     stated in the past - pt denies this as of 07/28/15)    Past Surgical History  Procedure Laterality Date  . Tubal ligation    . Orif calcaneous fracture Left 10/21/2014    Procedure: OPEN REDUCTION INTERNAL FIXATION (ORIF) LEFT CALCANEOUS FRACTURE;  Surgeon: Rozanna Box, MD;  Location: [place];  Service: Orthopedics;  Laterality: Left;  Marland Kitchen Kyphoplasty N/A 02/17/2015    Procedure: T12 KYPHOPLASTY;  Surgeon: Melina Schools, MD;  Location: [place];  Service: Orthopedics;  Laterality: N/A;  . Colonoscopy    . Hardware removal Left 07/29/2015    Procedure: HARDWARE REMOVAL,LEFT HEEL;  Surgeon: Altamese Lydia, MD;  Location: [place];  Service: Orthopedics;  Laterality: Left;  . Steriod injection Right 07/29/2015    Procedure: Subtalar Joint injection with Fluoro  guidance ;  Surgeon: Altamese [place], MD;  Location: [place];  Service: Orthopedics;  Laterality: Right;    There were no vitals filed for this visit.  Visit Diagnosis:  Neck pain  Bilateral low back pain without sciatica  Stiffness of joints, multiple sites  Activity intolerance  Abnormal posture  Difficulty walking      Subjective Assessment - 03/20/16 0932    Subjective Patient states she went to [place] over the weekend and walked 2 miles with some rest here and there.     Currently in Pain? No/denies   Pain Score 0-No pain                         [facility] Adult PT Treatment/Exercise - 03/20/16 0001    Neck Exercises: Seated   Other Seated Exercise sitting on ball with UE raises and LE lifts   Other Seated Exercise sitting on green ball with yellow band scapular exercises   Knee/Hip Exercises: Aerobic   Nustep L2 10 min   Knee/Hip Exercises: Standing   Wall Squat 10 reps   Lunge Walking - Round Trips red band side step 3x   Other Standing Knee Exercises lean on ball with LE and UE lifts 10x   Other Standing Knee Exercises wall slides with ball 15x   Shoulder Exercises: Standing   Other Standing Exercises yellow band scapular  diagonals 10x right/left   Moist Heat Therapy   Number Minutes Moist Heat 10 Minutes   Moist Heat Location Cervical;Lumbar Spine   Manual Therapy   Soft tissue mobilization light soft tissue to bilateral lumbar paraspinals, rhomboids and upper traps   Myofascial Release B lumbar paraspinals and gluteals, piriformis                  PT Short Term Goals - 03/20/16 2018    PT SHORT TERM GOAL #1   Title I with initial HEP (01/09/16)   Status Achieved   PT SHORT TERM GOAL #2   Title decreased pain to 5/10 overall (01/09/16)   Status Achieved   PT SHORT TERM GOAL #3   Title improved cervical rotation by 20 degrees B (01/09/16)   Status Achieved           PT Long Term Goals - 03/20/16 2018    PT LONG TERM GOAL #1    Title I with advanced HEP   Time 8   Period Weeks   Status On-going   PT LONG TERM GOAL #2   Title Decreased pain overall by 50% since evaluation with ADLS   Status Achieved   PT LONG TERM GOAL #3   Title Patient able to stand to do ADLs for 30 min before needing to sit.   Time 8   Period Weeks   Status Partially Met   PT LONG TERM GOAL #4   Title Patient able to walk for 30 min before needing stop for pain   Time 8   Period Weeks   Status Partially Met   PT LONG TERM GOAL #5   Title Able to do up and down stairs to bedroom 2x/day rather than sleeping on the couch   Status Achieved               Plan - 03/20/16 2015    Clinical Impression Statement The patient is progressing well with a variety of ex for functional reactivation for core and extremity strengthening without pain exacerbation.    Fewer pain behaviors.  Decreasing tender points in lumbar, cervical and periscapular muscles with manual therapy.  Therapist closely monitoring response with all interventions.  Progressing with goals.  Discharge in 2-3 visits.     PT Next Visit Plan continue functional reactivation;  modalities;  manual therapy as needed   KX        Problem List Patient Active Problem List   Diagnosis Date Noted  . Chronic venous insufficiency 03/09/2016  . Hepatitis C 03/05/2016  . Varicose veins of lower extremities with complications 01/01/3234  . Chronic hepatitis C virus infection ([place]) 12/15/2015  . Allergic rhinitis 12/09/2015  . Insomnia 12/09/2015  . Genital herpes 12/09/2015  . Hyperlipidemia 12/09/2015  . Menopausal symptom 12/09/2015  . Compression fracture of body of thoracic vertebra 02/17/2015  . Lumbar compression fracture, L1-2 endplate  57/32/2025  . Thoracic compression fracture, T12 endplate  42/70/6237  . Fracture of metatarsal bone of right foot, 4th neck 10/22/2014  . Has jumped from building 10/22/2014  . Bipolar 1 disorder ([place])   . Schizoaffective disorder  ([place])   . Calcaneal fracture, Left  10/19/2014    Alvera Singh 03/20/2016, 8:20 PM  [facility] [address] [place], [place], [address] Phone: [phone]   Fax:  [phone]  Name: Lauren Fuentes MRN: [id number] Date of Birth: [birth year]    Ruben Im, PT 03/20/2016 8:20  PM Phone: [phone] Fax: [phone]

## 2016-03-21 DIAGNOSIS — Z79891 Long term (current) use of opiate analgesic: Secondary | ICD-10-CM | POA: Diagnosis not present

## 2016-03-21 DIAGNOSIS — F329 Major depressive disorder, single episode, unspecified: Secondary | ICD-10-CM | POA: Diagnosis not present

## 2016-03-21 DIAGNOSIS — M47816 Spondylosis without myelopathy or radiculopathy, lumbar region: Secondary | ICD-10-CM | POA: Diagnosis not present

## 2016-03-21 DIAGNOSIS — G8929 Other chronic pain: Secondary | ICD-10-CM | POA: Diagnosis not present

## 2016-03-21 DIAGNOSIS — M542 Cervicalgia: Secondary | ICD-10-CM | POA: Diagnosis not present

## 2016-03-21 LAB — TOXASSURE SELECT 13 (MW), URINE: PDF: 0

## 2016-03-27 ENCOUNTER — Ambulatory Visit: Payer: Medicare Other | Attending: Family Medicine | Admitting: Physical Therapy

## 2016-03-27 DIAGNOSIS — M542 Cervicalgia: Secondary | ICD-10-CM

## 2016-03-27 DIAGNOSIS — M6281 Muscle weakness (generalized): Secondary | ICD-10-CM | POA: Diagnosis not present

## 2016-03-27 DIAGNOSIS — M545 Low back pain: Secondary | ICD-10-CM

## 2016-03-27 NOTE — Patient Instructions (Signed)
       Isometric Hold (Quadruped)   On hands and knees, slowly inhale, and then exhale. Pull navel toward spine and Hold for _5__ seconds. Continue to breathe in and out during hold. Rest for _5__ seconds. Repeat __5_ times. Do _1__ times a day.   Copyright  VHI. All rights reserved.  Bracing With Arm Raise (Quadruped)   On hands and knees find neutral spine. Tighten pelvic floor and abdominals and hold. Alternately lift arm to shoulder level. Repeat _5__ times. Do 1___ times a day.   Copyright  VHI. All rights reserved.  Bracing With Leg Raise (Quadruped)   On hands and knees find neutral spine. Tighten pelvic floor and abdominals and hold. Alternating legs, straighten and lift to hip level. Repeat __5_ times. Do __1_ times a day.   Copyright  VHI. All rights reserved.  Bracing With Arm / Leg Raise (Quadruped)   On hands and knees find neutral spine. Tighten pelvic floor and abdominals and hold. Alternating, lift arm to shoulder level and opposite leg to hip level. Repeat _5__ times. Do _1__ times a day.  Over Head Pull: Narrow Grip       On back, knees bent, feet flat, band across thighs, elbows straight but relaxed. Pull hands apart (start). Keeping elbows straight, bring arms up and over head, hands toward floor. Keep pull steady on band. Hold momentarily. Return slowly, keeping pull steady, back to start. Repeat _10 __ times. Band color ___yellow___   Side Pull: Double Arm   On back, knees bent, feet flat. Arms perpendicular to body, shoulder level, elbows straight but relaxed. Pull arms out to sides, elbows straight. Resistance band comes across collarbones, hands toward floor. Hold momentarily. Slowly return to starting position. Repeat __10_ times. Band color __yellow___   Sash   On back, knees bent, feet flat, left hand on left hip, right hand above left. Pull right arm DIAGONALLY (hip to shoulder) across chest. Bring right arm along head toward floor. Hold  momentarily. Slowly return to starting position. Repeat _10__ times. Do with left arm. Band color ___yellow___   Shoulder Rotation: Double Arm   On back, knees bent, feet flat, elbows tucked at sides, bent 90, hands palms up. Pull hands apart and down toward floor, keeping elbows near sides. Hold momentarily. Slowly return to starting position. Repeat _10__ times. Band color __yellow____    [facility] [address] [place], [place] [address] Phone # [phone] Fax [phone] Copyright  VHI. All rights reserved.

## 2016-03-27 NOTE — Therapy (Addendum)
[facility] [address], [address] [place], [place], [address] Phone: [phone]   Fax:  [phone]  Physical Therapy Treatment/Recertification  Patient Details  Name: Lauren Fuentes MRN: [id number] Date of Birth: Dec 18, 1966 Referring Provider: Mechele Claude MD  Encounter Date: 03/27/2016      PT End of Session - 03/27/16 0919    Visit Number 18   Number of Visits 20   Date for PT Re-Evaluation 04/03/16   Authorization Type G code at visit 20;    PT Start Time 0845   PT Stop Time 0930   PT Time Calculation (min) 45 min   Activity Tolerance Patient tolerated treatment well      Past Medical History  Diagnosis Date  . Bipolar 1 disorder ([facility])   . Schizoaffective disorder ([facility])   . Post traumatic stress disorder (PTSD)   . Calcaneal fracture, Left  10/19/2014  . Thoracic compression fracture, T12 endplate  10/22/2014  . Lumbar compression fracture, L1-2 endplate  10/22/2014  . Fracture of metatarsal bone of right foot, 4th neck 10/22/2014  . Back pain, chronic   . GERD (gastroesophageal reflux disease)   . Arthritis   . Headache     hx migraines, tension headaches  . Hepatitis     stated in the past - pt denies this as of 07/28/15)    Past Surgical History  Procedure Laterality Date  . Tubal ligation    . Orif calcaneous fracture Left 10/21/2014    Procedure: OPEN REDUCTION INTERNAL FIXATION (ORIF) LEFT CALCANEOUS FRACTURE;  Surgeon: Budd Palmer, MD;  [facility];  Service: Orthopedics;  Laterality: Left;  Marland Kitchen Kyphoplasty N/A 02/17/2015    Procedure: T12 KYPHOPLASTY;  Surgeon: Venita Lick, MD;  Location[facility];  Service: Orthopedics;  Laterality: N/A;  . Colonoscopy    . Hardware removal Left 07/29/2015    Procedure: HARDWARE REMOVAL,LEFT HEEL;  Surgeon: Myrene Galas, MD;  Location: [facility];  Service: Orthopedics;  Laterality: Left;  . Steriod injection Right 07/29/2015    Procedure: Subtalar Joint injection  with Fluoro guidance ;  Surgeon: Myrene Galas, MD;  Location: [facility];  Service: Orthopedics;  Laterality: Right;    There were no vitals filed for this visit.  Visit Diagnosis:  Cervicalgia  Bilateral low back pain, with sciatica presence unspecified  Muscle weakness (generalized)      Subjective Assessment - 03/27/16 0851    Subjective States she did some walking over the weekend.  Walked on a trail, close to a mile.  I did walk to the store and I did have to stop several times 15-20 minutes.  A little pain in lower back.  Some good days on stairs, some days not as good.  Mornings are tougher.  Did OK after last visit.     Currently in Pain? Yes   Pain Score 1    Pain Location Back   Pain Orientation Lower;Right;Left                         [facility] Adult PT Treatment/Exercise - 03/27/16 0001    Exercises   Exercises Lumbar   Lumbar Exercises: Aerobic   UBE (Upper Arm Bike) 2 min FW/ 2 min BW   Lumbar Exercises: Machines for Strengthening   Other Lumbar Machine Exercise Seated B row 15 # 20x   Other Lumbar Machine Exercise Seated lat bar 15# 15x   Lumbar Exercises: Supine   Ab Set 5  reps   Bent Knee Raise 5 reps   Isometric Hip Flexion 5 reps   Other Supine Lumbar Exercises red band overhead, HABD, diagonals and ER 10x each   Lumbar Exercises: Quadruped   Single Arm Raise 5 reps;Right;Left   Straight Leg Raise 5 reps   Opposite Arm/Leg Raise Right arm/Left leg;Left arm/Right leg;5 reps   Knee/Hip Exercises: Aerobic   Nustep L2 10 min                PT Education - 03/27/16 0914    Education provided Yes   Education Details supine abdominal brace, supine scap stabilization with band;  bird dog   Person(s) Educated Patient   Methods Explanation;Demonstration;Handout   Comprehension Verbalized understanding;Returned demonstration          PT Short Term Goals - 03/27/16 1658    PT SHORT TERM GOAL #1   Title I with initial HEP (01/09/16)    Status Achieved   PT SHORT TERM GOAL #2   Title decreased pain to 5/10 overall (01/09/16)   Status Achieved   PT SHORT TERM GOAL #3   Title improved cervical rotation by 20 degrees B (01/09/16)   Status Achieved           PT Long Term Goals - 03/27/16 1658    PT LONG TERM GOAL #1   Title I with advanced HEP   Time 8   Period Weeks   Status On-going   PT LONG TERM GOAL #2   Title Decreased pain overall by 50% since evaluation with ADLS   Status Achieved   PT LONG TERM GOAL #3   Title Patient able to stand to do ADLs for 30 min before needing to sit.   Time 8   Period Weeks   Status Partially Met   PT LONG TERM GOAL #4   Title Patient able to walk for 30 min before needing stop for pain   Time 8   Period Weeks   Status Partially Met   PT LONG TERM GOAL #5   Title Able to do up and down stairs to bedroom 2x/day rather than sleeping on the couch   Status Achieved   Additional Long Term Goals   Additional Long Term Goals Yes       Plan - 04/02/16 1937    Clinical Impression Statement The patient is now able to perform supine  core strengthening exercises in supine.  This is a significant improvement  as patient was unable to tolerate lying supine for any length of time 6  weeks ago.  Her pain continues to be mild during exercise and she is much  less fearful of physical activtiy.  She declines the need for modalities  today.  Should meet remaining goals in 1-2 visits.     Rehab Potential Fair   PT Frequency 2x / week   PT Duration 8 weeks   PT Treatment/Interventions ADLs/Self Care Home Management;Electrical  Stimulation;Cryotherapy;Moist Heat;Therapeutic exercise;Manual  techniques;Patient/family education;Neuromuscular  re-education;Ultrasound;Passive range of motion;Dry needling   PT Next Visit Plan continue functional reactivation;  modalities;  check  goals for possible discharge next visit;     KX     Patient will benefit from skilled therapeutic intervention  in order to improve the following deficits and impairments:  Decreased range of motion, Decreased activity tolerance, Pain, Decreased strength  Visit Diagnosis: Cervicalgia  (primary encounter diagnosis)  Bilateral low back pain, with sciatica presence unspecified  Muscle weakness (generalized)  Problem List Patient Active Problem List   Diagnosis Date Noted  . Chronic venous insufficiency 03/09/2016  . Hepatitis C 03/05/2016  . Varicose veins of lower extremities with complications 67/67/2094  . Chronic hepatitis C virus infection ([place]) 12/15/2015  . Allergic rhinitis 12/09/2015  . Insomnia 12/09/2015  . Genital herpes 12/09/2015  . Hyperlipidemia 12/09/2015  . Menopausal symptom 12/09/2015  . Compression fracture of body of thoracic vertebra 02/17/2015  . Lumbar compression fracture, L1-2 endplate  70/96/2836  . Thoracic compression fracture, T12 endplate  62/94/7654  . Fracture of metatarsal bone of right foot, 4th neck 10/22/2014  . Has jumped from building 10/22/2014  . Bipolar 1 disorder ([place])   . Schizoaffective disorder ([place])   . Calcaneal fracture, Left  10/19/2014    Ruben Im C 03/27/2016, 5:01 PM  [facility] [address] [place], [place], [address] Phone: [phone]   Fax:  [phone]  Name: Marquerite Forsman MRN: [id number] Date of Birth: [birth year]    Ruben Im, PT 03/27/2016 5:02 PM Phone: [phone] Fax: [phone]

## 2016-03-28 DIAGNOSIS — G8929 Other chronic pain: Secondary | ICD-10-CM | POA: Diagnosis not present

## 2016-03-28 DIAGNOSIS — M47816 Spondylosis without myelopathy or radiculopathy, lumbar region: Secondary | ICD-10-CM | POA: Diagnosis not present

## 2016-03-28 DIAGNOSIS — M545 Low back pain: Secondary | ICD-10-CM | POA: Diagnosis not present

## 2016-03-28 DIAGNOSIS — M5416 Radiculopathy, lumbar region: Secondary | ICD-10-CM | POA: Diagnosis not present

## 2016-03-29 ENCOUNTER — Ambulatory Visit: Payer: Medicare Other | Admitting: Physical Therapy

## 2016-03-29 DIAGNOSIS — M545 Low back pain, unspecified: Secondary | ICD-10-CM

## 2016-03-29 DIAGNOSIS — M542 Cervicalgia: Secondary | ICD-10-CM

## 2016-03-29 DIAGNOSIS — M6281 Muscle weakness (generalized): Secondary | ICD-10-CM | POA: Diagnosis not present

## 2016-03-29 NOTE — Therapy (Signed)
[facility] [address] [place], [place], [address] Phone: [phone]   Fax:  [phone]  Physical Therapy Treatment  Patient Details  Name: Lauren Fuentes MRN: [id number] Date of Birth: 20-Mar-1966 Referring Provider: Claretta Fraise MD  Encounter Date: 03/29/2016      PT End of Session - 03/29/16 0916    Visit Number 19   Number of Visits 20   Date for PT Re-Evaluation 04/03/16   Authorization Type G code at visit 20;    PT Start Time 0845   PT Stop Time 0930   PT Time Calculation (min) 45 min   Activity Tolerance Patient limited by pain      Past Medical History  Diagnosis Date  . Bipolar 1 disorder ([place])   . Schizoaffective disorder ([place])   . Post traumatic stress disorder (PTSD)   . Calcaneal fracture, Left  10/19/2014  . Thoracic compression fracture, T12 endplate  82/42/3536  . Lumbar compression fracture, L1-2 endplate  14/43/1540  . Fracture of metatarsal bone of right foot, 4th neck 10/22/2014  . Back pain, chronic   . GERD (gastroesophageal reflux disease)   . Arthritis   . Headache     hx migraines, tension headaches  . Hepatitis     stated in the past - pt denies this as of 07/28/15)    Past Surgical History  Procedure Laterality Date  . Tubal ligation    . Orif calcaneous fracture Left 10/21/2014    Procedure: OPEN REDUCTION INTERNAL FIXATION (ORIF) LEFT CALCANEOUS FRACTURE;  Surgeon: Rozanna Box, MD;  Location: [place];  Service: Orthopedics;  Laterality: Left;  Marland Kitchen Kyphoplasty N/A 02/17/2015    Procedure: T12 KYPHOPLASTY;  Surgeon: Melina Schools, MD;  Location: [place];  Service: Orthopedics;  Laterality: N/A;  . Colonoscopy    . Hardware removal Left 07/29/2015    Procedure: HARDWARE REMOVAL,LEFT HEEL;  Surgeon: Altamese [place], MD;  Location: [place];  Service: Orthopedics;  Laterality: Left;  . Steriod injection Right 07/29/2015    Procedure: Subtalar Joint injection with Fluoro guidance ;   Surgeon: Altamese , MD;  Location: [place];  Service: Orthopedics;  Laterality: Right;    There were no vitals filed for this visit.  Visit Diagnosis:  Bilateral low back pain without sciatica  Cervicalgia  Muscle weakness (generalized)      Subjective Assessment - 03/29/16 0856    Subjective Had a back injection yesterday and "my back is really hurting yesterday and today."     Currently in Pain? Yes   Pain Score 6    Pain Location Back   Pain Orientation Right;Left   Pain Type Chronic pain   Pain Frequency Constant                         [facility] Adult PT Treatment/Exercise - 03/29/16 0001    Neck Exercises: Seated   Other Seated Exercise 2# plyoball hip swings. chops, Charlie Browns 1 min each   Other Seated Exercise Push downs on foam roll with exhale 15x   Lumbar Exercises: Standing   Scapular Retraction --  wall push ups 10x   Other Standing Lumbar Exercises yellow band rows 25x   Other Standing Lumbar Exercises yellow band shoulder extensions 25x   Knee/Hip Exercises: Aerobic   Nustep L1 5 min  discontinued early secondary to LBP   Moist Heat Therapy   Number Minutes Moist Heat 15 Minutes   Moist Heat  Location Lumbar Spine   Electrical Stimulation   Electrical Stimulation Location B lumbar   Electrical Stimulation Action IFC   Electrical Stimulation Parameters 9 ma   Electrical Stimulation Goals Pain                  PT Short Term Goals - 03/29/16 0924    PT SHORT TERM GOAL #1   Title I with initial HEP (01/09/16)   Status Achieved   PT SHORT TERM GOAL #2   Title decreased pain to 5/10 overall (01/09/16)   Status Achieved   PT SHORT TERM GOAL #3   Title improved cervical rotation by 20 degrees B (01/09/16)   Status Achieved           PT Long Term Goals - 03/29/16 0924    PT LONG TERM GOAL #1   Title I with advanced HEP   Time 8   Period Weeks   Status On-going   PT LONG TERM GOAL #2   Title Decreased pain overall by  50% since evaluation with ADLS   Status Achieved   PT LONG TERM GOAL #3   Title Patient able to stand to do ADLs for 30 min before needing to sit.   Time 8   Period Weeks   Status Partially Met   PT LONG TERM GOAL #4   Title Patient able to walk for 30 min before needing stop for pain   Time 8   Period Weeks   Status Partially Met   PT LONG TERM GOAL #5   Title Able to do up and down stairs to bedroom 2x/day rather than sleeping on the couch   Status Achieved               Plan - 03/29/16 0917    Clinical Impression Statement The patient has increased low back pain following spinal injection yesterday.  Treatment significantly modified in response to pain compared to the last several visits when she was able to participate in moderate level ex without minimal to no pain.  Modalities applied for pain control with mild pain relief.  Check progress toward goals next visit for possible discharge.     PT Next Visit Plan continue functional reactivation;  modalities;  check goals for possible discharge next visit;     KX and G code next visit        Problem List Patient Active Problem List   Diagnosis Date Noted  . Chronic venous insufficiency 03/09/2016  . Hepatitis C 03/05/2016  . Varicose veins of lower extremities with complications 03/47/4259  . Chronic hepatitis C virus infection ([place]) 12/15/2015  . Allergic rhinitis 12/09/2015  . Insomnia 12/09/2015  . Genital herpes 12/09/2015  . Hyperlipidemia 12/09/2015  . Menopausal symptom 12/09/2015  . Compression fracture of body of thoracic vertebra 02/17/2015  . Lumbar compression fracture, L1-2 endplate  56/38/7564  . Thoracic compression fracture, T12 endplate  33/29/5188  . Fracture of metatarsal bone of right foot, 4th neck 10/22/2014  . Has jumped from building 10/22/2014  . Bipolar 1 disorder ([place])   . Schizoaffective disorder ([place])   . Calcaneal fracture, Left  10/19/2014   Ruben Im, PT 03/29/2016 9:27  AM Phone: [phone] Fax: [phone] Alvera Singh 03/29/2016, 9:26 AM  [facility] [address] [place], [place], [address] Phone: [phone]   Fax:  [phone]  Name: Lauren Fuentes MRN: [id number] Date of Birth: [birth year]

## 2016-04-02 NOTE — Addendum Note (Signed)
Addended by: Alvera Singh on: 04/02/2016 07:45 PM   Modules accepted: Orders

## 2016-04-03 ENCOUNTER — Ambulatory Visit: Payer: Medicare Other | Admitting: Physical Therapy

## 2016-04-03 DIAGNOSIS — M542 Cervicalgia: Secondary | ICD-10-CM | POA: Diagnosis not present

## 2016-04-03 DIAGNOSIS — M6281 Muscle weakness (generalized): Secondary | ICD-10-CM

## 2016-04-03 DIAGNOSIS — M545 Low back pain, unspecified: Secondary | ICD-10-CM

## 2016-04-03 NOTE — Therapy (Signed)
[facility] [address], [place], [place], [address] Phone: [phone]   Fax:  [phone]  Physical Therapy Treatment/Discharge Summary  Patient Details  Name: Lauren Fuentes MRN: [id number] Date of Birth: Jun 13, 1966 Referring Provider: Claretta Fraise MD  Encounter Date: 04/03/2016      PT End of Session - 04/03/16 0916    Visit Number 20   Number of Visits 20   Date for PT Re-Evaluation 04/03/16   Authorization Type G code at visit 20;    PT Start Time 0845   PT Stop Time 0930   PT Time Calculation (min) 45 min   Activity Tolerance Patient tolerated treatment well      Past Medical History  Diagnosis Date  . Bipolar 1 disorder ([place])   . Schizoaffective disorder ([place])   . Post traumatic stress disorder (PTSD)   . Calcaneal fracture, Left  10/19/2014  . Thoracic compression fracture, T12 endplate  73/42/8768  . Lumbar compression fracture, L1-2 endplate  11/57/2620  . Fracture of metatarsal bone of right foot, 4th neck 10/22/2014  . Back pain, chronic   . GERD (gastroesophageal reflux disease)   . Arthritis   . Headache     hx migraines, tension headaches  . Hepatitis     stated in the past - pt denies this as of 07/28/15)    Past Surgical History  Procedure Laterality Date  . Tubal ligation    . Orif calcaneous fracture Left 10/21/2014    Procedure: OPEN REDUCTION INTERNAL FIXATION (ORIF) LEFT CALCANEOUS FRACTURE;  Surgeon: Rozanna Box, MD;  Location: [place];  Service: Orthopedics;  Laterality: Left;  Marland Kitchen Kyphoplasty N/A 02/17/2015    Procedure: T12 KYPHOPLASTY;  Surgeon: Melina Schools, MD;  Location: [place];  Service: Orthopedics;  Laterality: N/A;  . Colonoscopy    . Hardware removal Left 07/29/2015    Procedure: HARDWARE REMOVAL,LEFT HEEL;  Surgeon: Altamese Harrod, MD;  Location: [place];  Service: Orthopedics;  Laterality: Left;  . Steriod injection Right 07/29/2015    Procedure: Subtalar Joint injection  with Fluoro guidance ;  Surgeon: Altamese Martinton, MD;  Location: [place];  Service: Orthopedics;  Laterality: Right;    There were no vitals filed for this visit.      Subjective Assessment - 04/03/16 0847    Subjective I feel great.  Walked yesterday a mile and then some more.     Currently in Pain? No/denies   Pain Score 0-No pain   Pain Location Back            [facility] PT Assessment - 04/03/16 0001    Observation/Other Assessments   Focus on Therapeutic Outcomes (FOTO)  36% limitation   AROM   AROM Assessment Site --  ext 52   Cervical Flexion 40   Cervical - Right Side Bend 35   Cervical - Left Side Bend 35   Cervical - Right Rotation 40   Cervical - Left Rotation 40   Lumbar Flexion 25   Lumbar Extension 15   Lumbar - Right Side Bend 40   Lumbar - Left Side Bend 45   Strength   Overall Strength --  Cervical, lumbar and hip strength grossly 4/5                     [facility] Adult PT Treatment/Exercise - 04/03/16 0001    Lumbar Exercises: Aerobic   UBE (Upper Arm Bike) 2 min FW/ 2 min BW  Lumbar Exercises: Supine   Other Supine Lumbar Exercises red band overhead, HABD, diagonals and ER 10x each on vertical towel roll   Knee/Hip Exercises: Aerobic   Nustep L1 5 min   Knee/Hip Exercises: Standing   Wall Squat 10 reps   Other Standing Knee Exercises wall climbs 10x   Other Standing Knee Exercises psoas doorway stretch;  wall exercises 10x each with UE reaches                  PT Short Term Goals - Apr 27, 2016 0916    PT SHORT TERM GOAL #1   Title I with initial HEP (01/09/16)   Status Achieved   PT SHORT TERM GOAL #2   Title decreased pain to 5/10 overall (01/09/16)   Status Achieved   PT SHORT TERM GOAL #3   Title improved cervical rotation by 20 degrees B (01/09/16)   Status Achieved           PT Long Term Goals - Apr 27, 2016 0850    PT LONG TERM GOAL #1   Title I with advanced HEP   Status Achieved   PT LONG TERM GOAL #2   Title  Decreased pain overall by 50% since evaluation with ADLS   Status Achieved   PT LONG TERM GOAL #3   Title Patient able to stand to do ADLs for 30 min before needing to sit.   Status Achieved   PT LONG TERM GOAL #4   Title Patient able to walk for 30 min before needing stop for pain   Status Achieved   PT LONG TERM GOAL #5   Title Able to do up and down stairs to bedroom 2x/day rather than sleeping on the couch   Status Achieved               Plan - 04/27/16 0916    Clinical Impression Statement The patient states she is "100% better" than when she started therapy in December.  Initially she was unable to lie supine or participate in any form of exercise or activity in the clinic or at home.  She is now independent in a HEP for ROM and strengthening in sitting, standing or supine without pain exacerbation.  Her AROM for neck and back has significantly improved from initial.  Her FOTO functional outcome score has improved dramatically from a 60% limitation to 36% limitation.  She has met all STGs and LTGs and her progress has been dramatic  Recommend discharge from PT at this time.        Patient will benefit from skilled therapeutic intervention in order to improve the following deficits and impairments:     Visit Diagnosis: Bilateral low back pain without sciatica  Cervicalgia  Muscle weakness (generalized)       G-Codes - 04-27-2016 1151    Functional Assessment Tool Used FOTO    Functional Limitation Mobility: Walking and moving around   Mobility: Walking and Moving Around Current Status [phone]) At least 20 percent but less than 40 percent impaired, limited or restricted   Mobility: Walking and Moving Around Goal Status [phone]) At least 40 percent but less than 60 percent impaired, limited or restricted   Mobility: Walking and Moving Around Discharge Status [phone]) At least 20 percent but less than 40 percent impaired, limited or restricted     PHYSICAL THERAPY DISCHARGE  SUMMARY  Visits from Start of Care: 20  Current functional level related to goals / functional outcomes: See clinical impressions above  Remaining deficits: As above   Education / Equipment: HEP Plan: Patient agrees to discharge.  Patient goals were met. Patient is being discharged due to meeting the stated rehab goals.  ?????         Problem List Patient Active Problem List   Diagnosis Date Noted  . Chronic venous insufficiency 03/09/2016  . Hepatitis C 03/05/2016  . Varicose veins of lower extremities with complications 40/37/0964  . Chronic hepatitis C virus infection ([place]) 12/15/2015  . Allergic rhinitis 12/09/2015  . Insomnia 12/09/2015  . Genital herpes 12/09/2015  . Hyperlipidemia 12/09/2015  . Menopausal symptom 12/09/2015  . Compression fracture of body of thoracic vertebra 02/17/2015  . Lumbar compression fracture, L1-2 endplate  38/38/1840  . Thoracic compression fracture, T12 endplate  37/54/3606  . Fracture of metatarsal bone of right foot, 4th neck 10/22/2014  . Has jumped from building 10/22/2014  . Bipolar 1 disorder ([place])   . Schizoaffective disorder ([place])   . Calcaneal fracture, Left  10/19/2014   Ruben Im, PT 04/03/2016 11:54 AM Phone: [phone] Fax: [phone] Alvera Singh 04/03/2016, 11:53 AM  [facility] [address] [place], [place], [address] Phone: [phone]   Fax:  [phone]  Name: Lauren Fuentes MRN: [id number] Date of Birth: 04-Jul-1966

## 2016-04-16 ENCOUNTER — Ambulatory Visit (INDEPENDENT_AMBULATORY_CARE_PROVIDER_SITE_OTHER): Payer: Medicare Other | Admitting: Family Medicine

## 2016-04-16 ENCOUNTER — Encounter: Payer: Self-pay | Admitting: Family Medicine

## 2016-04-16 VITALS — BP 122/67 | HR 99 | Temp 102.0°F | Ht 63.5 in | Wt 193.8 lb

## 2016-04-16 DIAGNOSIS — J111 Influenza due to unidentified influenza virus with other respiratory manifestations: Secondary | ICD-10-CM

## 2016-04-16 DIAGNOSIS — R52 Pain, unspecified: Secondary | ICD-10-CM | POA: Diagnosis not present

## 2016-04-16 LAB — VERITOR FLU A/B WAIVED
INFLUENZA A: NEGATIVE
INFLUENZA B: POSITIVE — AB

## 2016-04-16 MED ORDER — OSELTAMIVIR PHOSPHATE 75 MG PO CAPS: 75.0000 mg | ORAL_CAPSULE | Freq: Two times a day (BID) | ORAL | Status: DC

## 2016-04-16 NOTE — Progress Notes (Signed)
BP 122/67 mmHg  Pulse 99  Temp(Src) 102 F (38.9 C) (Oral)  Ht 5' 3.5" (1.613 m)  Wt 193 lb 12.8 oz (87.907 kg)  BMI 33.79 kg/m2  LMP 10/17/2015   Subjective:    Patient ID: Lauren Fuentes, female    DOB: March 18, 1966, 50 y.o.   MRN: [id number]  HPI: Lauren Fuentes is a 50 y.o. female presenting on 04/16/2016 for Generalized Body Aches; Cough; Chills; and Sinusitis   HPI Body aches and cough and chills Patient has been having 4 days worth of body aches and chills and fevers and headache and congestion and postnasal drainage. She denies any shortness of breath or wheezing. Her fever has been as high as 102. She denies any sick contacts that she knows of. She has been using ibuprofen to help with the fevers and chills.  Relevant past medical, surgical, family and social history reviewed and updated as indicated. Interim medical history since our last visit reviewed. Allergies and medications reviewed and updated.  Review of Systems  Constitutional: Positive for fever and chills.  HENT: Positive for congestion, postnasal drip, rhinorrhea, sinus pressure, sneezing and sore throat. Negative for ear discharge and ear pain.   Eyes: Negative for pain, redness and visual disturbance.  Respiratory: Positive for cough. Negative for chest tightness and shortness of breath.   Cardiovascular: Negative for chest pain and leg swelling.  Genitourinary: Negative for dysuria and difficulty urinating.  Musculoskeletal: Positive for myalgias. Negative for back pain and gait problem.  Skin: Negative for rash.  Neurological: Positive for headaches. Negative for light-headedness.  Psychiatric/Behavioral: Negative for behavioral problems and agitation.  All other systems reviewed and are negative.   Per HPI unless specifically indicated above     Medication List       This list is accurate as of: 04/16/16  6:03 PM.  Always use your most recent med list.               busPIRone 5 MG tablet    Commonly known as:  BUSPAR  Take 10 mg by mouth 3 (three) times daily.     cyclobenzaprine 5 MG tablet  Commonly known as:  FLEXERIL  Take 5 mg by mouth 2 (two) times daily.     estradiol-norethindrone 1-0.5 MG tablet  Commonly known as:  ACTIVELLA  Take 1 tablet by mouth daily.     flunisolide 25 MCG/ACT (0.025%) Soln  Commonly known as:  NASALIDE  Place 2 sprays into the nose daily.     FLUoxetine 40 MG capsule  Commonly known as:  PROZAC  Take 40 mg by mouth daily.     FLUTICASONE FUROATE NA  Place into the nose.     ibuprofen 800 MG tablet  Commonly known as:  ADVIL,MOTRIN  Take 1 tablet (800 mg total) by mouth every 8 (eight) hours as needed for headache or moderate pain.     lamoTRIgine 100 MG tablet  Commonly known as:  LAMICTAL  Take 100 mg by mouth 2 (two) times daily.     oseltamivir 75 MG capsule  Commonly known as:  TAMIFLU  Take 1 capsule (75 mg total) by mouth 2 (two) times daily.     PATADAY OP  Apply to eye.     SYSTANE FREE OP  Apply to eye.     traZODone 150 MG tablet  Commonly known as:  DESYREL  Take 300 mg by mouth at bedtime.     valACYclovir 500 MG tablet  Commonly known  as:  VALTREX  Take 1 tablet (500 mg total) by mouth 2 (two) times daily.     Vitamin D (Ergocalciferol) 50000 units Caps capsule  Commonly known as:  DRISDOL  Take 1 capsule (50,000 Units total) by mouth every 7 (seven) days.           Objective:    BP 122/67 mmHg  Pulse 99  Temp(Src) 102 F (38.9 C) (Oral)  Ht 5' 3.5" (1.613 m)  Wt 193 lb 12.8 oz (87.907 kg)  BMI 33.79 kg/m2  LMP 10/17/2015  Wt Readings from Last 3 Encounters:  04/16/16 193 lb 12.8 oz (87.907 kg)  03/16/16 192 lb 3.2 oz (87.181 kg)  03/09/16 193 lb 8 oz (87.771 kg)    Physical Exam  Constitutional: She is oriented to person, place, and time. She appears well-developed and well-nourished. No distress.  HENT:  Right Ear: Tympanic membrane, external ear and ear canal normal.  Left Ear:  Tympanic membrane, external ear and ear canal normal.  Nose: Mucosal edema and rhinorrhea present. No epistaxis. Right sinus exhibits no maxillary sinus tenderness and no frontal sinus tenderness. Left sinus exhibits no maxillary sinus tenderness and no frontal sinus tenderness.  Mouth/Throat: Uvula is midline and mucous membranes are normal. Posterior oropharyngeal edema and posterior oropharyngeal erythema present. No oropharyngeal exudate or tonsillar abscesses.  Eyes: Conjunctivae and EOM are normal.  Neck: Neck supple. No thyromegaly present.  Cardiovascular: Normal rate, regular rhythm, normal heart sounds and intact distal pulses.   No murmur heard. Pulmonary/Chest: Effort normal and breath sounds normal. No respiratory distress. She has no wheezes. She has no rales. She exhibits no tenderness.  Musculoskeletal: Normal range of motion. She exhibits no edema or tenderness.  Lymphadenopathy:    She has no cervical adenopathy.  Neurological: She is alert and oriented to person, place, and time. Coordination normal.  Skin: Skin is warm and dry. No rash noted. She is not diaphoretic.  Psychiatric: She has a normal mood and affect. Her behavior is normal.  Vitals reviewed.     Assessment & Plan:   Problem List Items Addressed This Visit    None    Visit Diagnoses    Body aches    -  Primary    Relevant Orders    Veritor Flu A/B Waived (Completed)    Influenza with respiratory manifestation        Relevant Medications    oseltamivir (TAMIFLU) 75 MG capsule    Other Relevant Orders    Veritor Flu A/B Waived (Completed)        Follow up plan: Return if symptoms worsen or fail to improve.  Counseling provided for all of the vaccine components Orders Placed This Encounter  Procedures  . Veritor Flu A/B Ulyses Southward, MD [facility] 04/16/2016, 6:03 PM

## 2016-04-23 ENCOUNTER — Ambulatory Visit (INDEPENDENT_AMBULATORY_CARE_PROVIDER_SITE_OTHER): Payer: Medicare Other | Admitting: Family

## 2016-04-23 VITALS — BP 121/87 | HR 94 | Temp 97.8°F | Ht 63.0 in | Wt 190.0 lb

## 2016-04-23 DIAGNOSIS — J101 Influenza due to other identified influenza virus with other respiratory manifestations: Secondary | ICD-10-CM | POA: Diagnosis not present

## 2016-04-23 DIAGNOSIS — J209 Acute bronchitis, unspecified: Secondary | ICD-10-CM | POA: Diagnosis not present

## 2016-04-23 MED ORDER — PREDNISONE 10 MG (21) PO TBPK: 10.0000 mg | ORAL_TABLET | Freq: Every day | ORAL | Status: DC

## 2016-04-23 MED ORDER — LEVOFLOXACIN 500 MG PO TABS: 500.0000 mg | ORAL_TABLET | Freq: Every day | ORAL | Status: DC

## 2016-04-23 MED ORDER — FLUTICASONE PROPIONATE 50 MCG/ACT NA SUSP: 2.0000 | Freq: Every day | NASAL | Status: DC

## 2016-04-23 MED ORDER — BENZONATATE 200 MG PO CAPS: 200.0000 mg | ORAL_CAPSULE | Freq: Three times a day (TID) | ORAL | Status: DC | PRN

## 2016-04-23 MED ORDER — HYDROCODONE-HOMATROPINE 5-1.5 MG/5ML PO SYRP: 5.0000 mL | ORAL_SOLUTION | Freq: Three times a day (TID) | ORAL | Status: DC | PRN

## 2016-04-23 NOTE — Patient Instructions (Signed)
Acute Bronchitis Bronchitis is inflammation of the airways that extend from the windpipe into the lungs (bronchi). The inflammation often causes mucus to develop. This leads to a cough, which is the most common symptom of bronchitis.  In acute bronchitis, the condition usually develops suddenly and goes away over time, usually in a couple weeks. Smoking, allergies, and asthma can make bronchitis worse. Repeated episodes of bronchitis may cause further lung problems.  CAUSES Acute bronchitis is most often caused by the same virus that causes a cold. The virus can spread from person to person (contagious) through coughing, sneezing, and touching contaminated objects. SIGNS AND SYMPTOMS   Cough.   Fever.   Coughing up mucus.   Body aches.   Chest congestion.   Chills.   Shortness of breath.   Sore throat.  DIAGNOSIS  Acute bronchitis is usually diagnosed through a physical exam. Your health care provider will also ask you questions about your medical history. Tests, such as chest X-rays, are sometimes done to rule out other conditions.  TREATMENT  Acute bronchitis usually goes away in a couple weeks. Oftentimes, no medical treatment is necessary. Medicines are sometimes given for relief of fever or cough. Antibiotic medicines are usually not needed but may be prescribed in certain situations. In some cases, an inhaler may be recommended to help reduce shortness of breath and control the cough. A cool mist vaporizer may also be used to help thin bronchial secretions and make it easier to clear the chest.  HOME CARE INSTRUCTIONS  Get plenty of rest.   Drink enough fluids to keep your urine clear or pale yellow (unless you have a medical condition that requires fluid restriction). Increasing fluids may help thin your respiratory secretions (sputum) and reduce chest congestion, and it will prevent dehydration.   Take medicines only as directed by your health care provider.  If  you were prescribed an antibiotic medicine, finish it all even if you start to feel better.  Avoid smoking and secondhand smoke. Exposure to cigarette smoke or irritating chemicals will make bronchitis worse. If you are a smoker, consider using nicotine gum or skin patches to help control withdrawal symptoms. Quitting smoking will help your lungs heal faster.   Reduce the chances of another bout of acute bronchitis by washing your hands frequently, avoiding people with cold symptoms, and trying not to touch your hands to your mouth, nose, or eyes.   Keep all follow-up visits as directed by your health care provider.  SEEK MEDICAL CARE IF: Your symptoms do not improve after 1 week of treatment.  SEEK IMMEDIATE MEDICAL CARE IF:  You develop an increased fever or chills.   You have chest pain.   You have severe shortness of breath.  You have bloody sputum.   You develop dehydration.  You faint or repeatedly feel like you are going to pass out.  You develop repeated vomiting.  You develop a severe headache. MAKE SURE YOU:   Understand these instructions.  Will watch your condition.  Will get help right away if you are not doing well or get worse.   This information is not intended to replace advice given to you by your health care provider. Make sure you discuss any questions you have with your health care provider.   Document Released: 01/17/2005 Document Revised: 12/31/2014 Document Reviewed: 06/02/2013 Elsevier Interactive Patient Education 2016 Else[employer] Inc[employer]  - Take meds as prescribed - Use a cool mist humidifier  -Use saline nose sprays frequently -Saline   irrigations of the nose can be very helpful if done frequently.  * 4X daily for 1 week*  * Use of a nettie pot can be helpful with this. Follow directions with this* -Force fluids -For any cough or congestion  Use plain Mucinex- regular strength or max strength is fine   * Children- consult with Pharmacist for  dosing -For fever or aces or pains- take tylenol or ibuprofen appropriate for age and weight.  * for fevers greater than 101 orally you may alternate ibuprofen and tylenol every  3 hours. -Throat lozenges if help    Christy Hawks, FNP  

## 2016-04-23 NOTE — Progress Notes (Signed)
Subjective:    Patient ID: Lauren Fuentes, female    DOB: 1966/03/29, 50 y.o.   MRN: [id number]  Pt presents to the office today for weakness and cough. PT was diagnosed with Flu B on 04/16/16 and given rx of Tamiful. PT reports feeling fatigue, fever, productive cough.  Cough This is a recurrent problem. The current episode started 1 to 4 weeks ago. The problem has been gradually worsening. The problem occurs every few minutes. The cough is productive of brown sputum. Associated symptoms include chills, ear congestion, a fever, myalgias, nasal congestion, postnasal drip, rhinorrhea, a sore throat, shortness of breath and wheezing. Pertinent negatives include no ear pain or headaches. She has tried rest for the symptoms. The treatment provided mild relief. There is no history of asthma or COPD.      Review of Systems  Constitutional: Positive for fever and chills.  HENT: Positive for postnasal drip, rhinorrhea and sore throat. Negative for ear pain.   Eyes: Negative.   Respiratory: Positive for cough, shortness of breath and wheezing.   Cardiovascular: Negative.  Negative for palpitations.  Gastrointestinal: Negative.   Endocrine: Negative.   Genitourinary: Negative.   Musculoskeletal: Positive for myalgias.  Neurological: Negative.  Negative for headaches.  Hematological: Negative.   Psychiatric/Behavioral: Negative.   All other systems reviewed and are negative.      Objective:   Physical Exam  Constitutional: She is oriented to person, place, and time. She appears well-developed and well-nourished. No distress.  HENT:  Head: Normocephalic and atraumatic.  Right Ear: External ear normal.  Left Ear: External ear normal.  Mouth/Throat: Oropharynx is clear and moist.  Nasal passage erythemas with mild swelling    Eyes: Pupils are equal, round, and reactive to light.  Neck: Normal range of motion. Neck supple. No thyromegaly present.  Cardiovascular: Normal rate, regular  rhythm, normal heart sounds and intact distal pulses.   No murmur heard. Pulmonary/Chest: Effort normal. No respiratory distress. She has wheezes.  Abdominal: Soft. Bowel sounds are normal. She exhibits no distension. There is no tenderness.  Musculoskeletal: Normal range of motion. She exhibits no edema or tenderness.  Neurological: She is alert and oriented to person, place, and time. She has normal reflexes. No cranial nerve deficit.  Skin: Skin is warm and dry.  Psychiatric: She has a normal mood and affect. Her behavior is normal. Judgment and thought content normal.  Vitals reviewed.     BP 121/87 mmHg  Pulse 94  Temp(Src) 97.8 F (36.6 C) (Oral)  Ht 5\' 3"  (1.6 m)  Wt 190 lb (86.183 kg)  BMI 33.67 kg/m2  LMP 10/17/2015     Assessment & Plan:  1. Influenza B - predniSONE (STERAPRED UNI-PAK 21 TAB) 10 MG (21) TBPK tablet; Take 1 tablet (10 mg total) by mouth daily. As directed x 6 days  Dispense: 21 tablet; Refill: 0 - HYDROcodone-homatropine (HYCODAN) 5-1.5 MG/5ML syrup; Take 5 mLs by mouth every 8 (eight) hours as needed for cough.  Dispense: 120 mL; Refill: 0 - fluticasone (FLONASE) 50 MCG/ACT nasal spray; Place 2 sprays into both nostrils daily.  Dispense: 16 g; Refill: 6  2. Acute bronchitis, unspecified organism -- Take meds as prescribed - Use a cool mist humidifier  -Use saline nose sprays frequently -Saline irrigations of the nose can be very helpful if done frequently.  * 4X daily for 1 week*  * Use of a nettie pot can be helpful with this. Follow directions with this* -Force fluids -For any  cough or congestion  Use plain Mucinex- regular strength or max strength is fine   * Children- consult with Pharmacist for dosing -For fever or aces or pains- take tylenol or ibuprofen appropriate for age and weight.  * for fevers greater than 101 orally you may alternate ibuprofen and tylenol every  3 hours. -Throat lozenges if help - levofloxacin (LEVAQUIN) 500 MG  tablet; Take 1 tablet (500 mg total) by mouth daily.  Dispense: 7 tablet; Refill: 0 - predniSONE (STERAPRED UNI-PAK 21 TAB) 10 MG (21) TBPK tablet; Take 1 tablet (10 mg total) by mouth daily. As directed x 6 days  Dispense: 21 tablet; Refill: 0 - HYDROcodone-homatropine (HYCODAN) 5-1.5 MG/5ML syrup; Take 5 mLs by mouth every 8 (eight) hours as needed for cough.  Dispense: 120 mL; Refill: 0 - benzonatate (TESSALON) 200 MG capsule; Take 1 capsule (200 mg total) by mouth 3 (three) times daily as needed.  Dispense: 30 capsule; Refill: 1 - fluticasone (FLONASE) 50 MCG/ACT nasal spray; Place 2 sprays into both nostrils daily.  Dispense: 16 g; Refill: [place], FNP

## 2016-04-30 ENCOUNTER — Telehealth: Payer: Self-pay

## 2016-04-30 ENCOUNTER — Other Ambulatory Visit: Payer: Self-pay | Admitting: Family Medicine

## 2016-04-30 DIAGNOSIS — F319 Bipolar disorder, unspecified: Secondary | ICD-10-CM | POA: Diagnosis not present

## 2016-04-30 DIAGNOSIS — J101 Influenza due to other identified influenza virus with other respiratory manifestations: Secondary | ICD-10-CM

## 2016-04-30 DIAGNOSIS — J209 Acute bronchitis, unspecified: Secondary | ICD-10-CM

## 2016-04-30 MED ORDER — BENZONATATE 200 MG PO CAPS
200.0000 mg | ORAL_CAPSULE | Freq: Three times a day (TID) | ORAL | Status: DC | PRN
Start: 2016-04-30 — End: 2016-07-02

## 2016-04-30 MED ORDER — LEVOFLOXACIN 500 MG PO TABS: 500.0000 mg | ORAL_TABLET | Freq: Every day | ORAL | Status: DC

## 2016-04-30 MED ORDER — PREDNISONE 50 MG PO TABS: ORAL_TABLET | ORAL | Status: DC

## 2016-04-30 MED ORDER — HYDROCODONE-HOMATROPINE 5-1.5 MG/5ML PO SYRP: 5.0000 mL | ORAL_SOLUTION | Freq: Three times a day (TID) | ORAL | Status: DC | PRN

## 2016-04-30 NOTE — Telephone Encounter (Signed)
Patient states that she thinks she was on prednisone 1-2 days and also Levaquin. Medication sent per Dr.Bradshaw.

## 2016-04-30 NOTE — Telephone Encounter (Signed)
No answer/ no VM. Put rx up front

## 2016-04-30 NOTE — Addendum Note (Signed)
Addended by: Karle Plumber on: 04/30/2016 04:48 PM   Modules accepted: Orders

## 2016-04-30 NOTE — Telephone Encounter (Signed)
[facility] with refill, will ask nursing to find out specifics on how many days of steroid and antibiotics taken so we do not double course (today should have been last day)  Refill benzonatate and hycodan, hycodan will have to be picked up.  Laroy Apple, MD [facility] 04/30/2016, 12:25 PM

## 2016-05-01 DIAGNOSIS — B182 Chronic viral hepatitis C: Secondary | ICD-10-CM | POA: Diagnosis not present

## 2016-06-04 ENCOUNTER — Encounter: Payer: Self-pay | Admitting: Vascular Surgery

## 2016-06-05 NOTE — Telephone Encounter (Signed)
Needs appointment to close quality gaps.

## 2016-06-07 ENCOUNTER — Ambulatory Visit: Payer: Medicare Other | Admitting: Family

## 2016-06-08 ENCOUNTER — Encounter: Payer: Self-pay | Admitting: Family

## 2016-06-12 ENCOUNTER — Ambulatory Visit: Payer: Medicare Other | Admitting: Vascular Surgery

## 2016-06-14 ENCOUNTER — Telehealth: Payer: Self-pay | Admitting: Family Medicine

## 2016-06-19 NOTE — Telephone Encounter (Signed)
Provider is going to give to one additional chance, pt aware

## 2016-06-28 ENCOUNTER — Telehealth: Payer: Self-pay | Admitting: Family Medicine

## 2016-07-02 ENCOUNTER — Telehealth: Payer: Self-pay | Admitting: Family Medicine

## 2016-07-02 ENCOUNTER — Other Ambulatory Visit: Payer: Self-pay | Admitting: Family Medicine

## 2016-07-02 ENCOUNTER — Encounter: Payer: Self-pay | Admitting: Family Medicine

## 2016-07-02 ENCOUNTER — Ambulatory Visit (INDEPENDENT_AMBULATORY_CARE_PROVIDER_SITE_OTHER): Payer: Medicare Other | Admitting: Family Medicine

## 2016-07-02 VITALS — BP 129/81 | HR 87 | Temp 98.5°F | Ht 63.0 in | Wt 194.0 lb

## 2016-07-02 DIAGNOSIS — M5432 Sciatica, left side: Secondary | ICD-10-CM

## 2016-07-02 DIAGNOSIS — F1410 Cocaine abuse, uncomplicated: Secondary | ICD-10-CM

## 2016-07-02 DIAGNOSIS — R441 Visual hallucinations: Secondary | ICD-10-CM | POA: Diagnosis not present

## 2016-07-02 DIAGNOSIS — M5431 Sciatica, right side: Secondary | ICD-10-CM

## 2016-07-02 MED ORDER — MELOXICAM 7.5 MG PO TABS: 7.5000 mg | ORAL_TABLET | Freq: Every day | ORAL | Status: DC

## 2016-07-02 MED ORDER — AMOXICILLIN-POT CLAVULANATE 875-125 MG PO TABS: 1.0000 | ORAL_TABLET | Freq: Two times a day (BID) | ORAL | Status: DC

## 2016-07-02 NOTE — Telephone Encounter (Signed)
Patient was seen today and forgot to mention a sinus infection that she thinks she has. Patient states that she has nasal congestion, sore around bilateral eyes, sore throat, and headache that has been going on for 4 days. Patient would like something called in to [facility]. Please advise.

## 2016-07-02 NOTE — Telephone Encounter (Signed)
Augmentin sent  for left-sided acute maxillary sinusitis Patient did have tenderness on palpation of L sided max on exam.  She had 4 days of symptoms, I left it open for her to decide whether she would try aggressive supportive treatment or go ahead and take antibiotics.  Laroy Apple, MD [facility] 07/02/2016, 2:11 PM

## 2016-07-02 NOTE — Telephone Encounter (Signed)
Patient aware.

## 2016-07-02 NOTE — Addendum Note (Signed)
Addended by: Timmothy Euler on: 07/02/2016 02:11 PM   Modules accepted: Orders

## 2016-07-02 NOTE — Patient Instructions (Signed)
Please call [facility] today for an appointment as soon as possible  I have sent meloxicam for your pain, I have also ordered physical therapy.   Do not take additional NSAIDs with mobic (including ibuprofen or aleve)  Please come back for follow up in 1 month

## 2016-07-02 NOTE — Progress Notes (Signed)
   HPI  Patient presents today here with several concerns.  Substance abuse, visual hallucinations Patient states that she has been clean for 2-3 days, she's using crack cocaine. She would like to enter rehabilitation. She complains of 3-4 months of disturbing dreams and visual/auditory hallucinations Describes seeing spiders crawling on the floor, hearing voices. She has severe frightening dreams of people trying to kill her and her trying to kill people. She states that she does not have any homicidal ideas or desire to hurt anyone.  She states that occasionally she has feelings of being better off if she wasn't here, she denies suicidal thoughts and states that she does not want to hurt herself, she contracts for safety. She's been taking her medications only intermittently for months. States that she's been mostly off. She agrees to see her psychiatrist as soon as possible.  Back pain States that she has not injected anything in years. Describes bilateral low back pain and midline low back pain that radiates down her bilateral legs, back pain is dull and achy and persistent, leg pain and foot pain is persistent, stabbing in nature at times. She clearly states that she does not want narcotic medications. She would like physical therapy if possible. Denies trying any medications  PMH: Smoking status noted ROS: Per HPI  Objective: BP 129/81 mmHg  Pulse 87  Temp(Src) 98.5 F (36.9 C) (Oral)  Ht 5\' 3"  (1.6 m)  Wt 194 lb (87.998 kg)  BMI 34.37 kg/m2 Gen: NAD, alert, cooperative with exam HEENT: NCAT CV: RRR, good S1/S2, no murmur Resp: CTABL, no wheezes, non-labored Ext: No edema, warm Neuro: Alert and oriented, Strength 5/5 and sensation intact in bilateral lower extremities with 2+ patellar tendon reflexes  MSK:  Bilateral lumbar paraspinal muscle pain, lumbar tenderness to palpation that is mild. Normal gait   Assessment and plan:  # Dug abuse, cocaine abuse Not using  now for 2 days Discussed option for rehab and given phone numbers to local facilities  # visual hallucinations, auditory as well, disturbing dreams Pt ceartainly has som eunderlying mood d/o, she also likely has substance inducded mood d/o Contracts for safety and denies SI Recommend follow up with psych ASAP, she will call today, she see'd Dr. Truman Hayward at [facility] Take meds regularly  # back pain, sciatica Strength good, straight leg raise normal Low threshold for MRI with Hx of IV drug abuse, although remote Conservative therapy for now- NSAIDs, PT    Orders Placed This Encounter  Procedures  . Ambulatory referral to Physical Therapy    Referral Priority:  Routine    Referral Type:  Physical Medicine    Referral Reason:  Specialty Services Required    Requested Specialty:  Physical Therapy    Number of Visits Requested:  1    Meds ordered this encounter  Medications  . meloxicam (MOBIC) 7.5 MG tablet    Sig: Take 1 tablet (7.5 mg total) by mouth daily.    Dispense:  30 tablet    Refill:  0    Laroy Apple, MD [facility] 07/02/2016, 9:26 AM

## 2016-07-02 NOTE — Telephone Encounter (Signed)
Patient is wanting to know when referral is for treatment of hepatitis. Patient said she thought she had an appointment in August

## 2016-07-05 ENCOUNTER — Telehealth: Payer: Self-pay | Admitting: Family Medicine

## 2016-07-10 ENCOUNTER — Encounter: Payer: Self-pay | Admitting: Physical Therapy

## 2016-07-10 ENCOUNTER — Ambulatory Visit: Payer: Medicare Other | Attending: Family Medicine | Admitting: Physical Therapy

## 2016-07-10 DIAGNOSIS — M546 Pain in thoracic spine: Secondary | ICD-10-CM | POA: Diagnosis not present

## 2016-07-10 DIAGNOSIS — M545 Low back pain: Secondary | ICD-10-CM | POA: Insufficient documentation

## 2016-07-10 DIAGNOSIS — M5440 Lumbago with sciatica, unspecified side: Secondary | ICD-10-CM | POA: Diagnosis not present

## 2016-07-10 NOTE — Patient Instructions (Signed)
  Side Twist, Kneeling  Sit in a chair with your hands resting on a low table or bed. Fold upper body forward from hips. Then reach to each side as far as possible, keeping chest low toward floor. Hold each position _30__ seconds. Repeat __3_ times per session. Do __2_ sessions per day.  Copyright  VHI. All rights reserved.   Madelyn Flavors, PT 07/10/2016 [facility] [place], [place], [address] Phone: [phone]   Fax:  [phone]

## 2016-07-10 NOTE — Therapy (Signed)
[facility] [place], [place], [address] Phone: [phone]   Fax:  [phone]  Physical Therapy Evaluation  Patient Details  Name: Lauren Fuentes MRN: [id number] Date of Birth: [birth year] Referring Provider: Kenn File  Encounter Date: 07/10/2016      PT End of Session - 07/10/16 1034    Visit Number 1   Number of Visits 21   Date for PT Re-Evaluation 08/21/16   PT Start Time 1034   PT Stop Time 1124   PT Time Calculation (min) 50 min   Activity Tolerance Patient tolerated treatment well   Behavior During Therapy [facility] for tasks assessed/performed      Past Medical History  Diagnosis Date  . Bipolar 1 disorder ([place])   . Schizoaffective disorder ([place])   . Post traumatic stress disorder (PTSD)   . Calcaneal fracture, Left  10/19/2014  . Thoracic compression fracture, T12 endplate  624THL  . Lumbar compression fracture, L1-2 endplate  624THL  . Fracture of metatarsal bone of right foot, 4th neck 10/22/2014  . Back pain, chronic   . GERD (gastroesophageal reflux disease)   . Arthritis   . Headache     hx migraines, tension headaches  . Hepatitis     stated in the past - pt denies this as of 07/28/15)    Past Surgical History  Procedure Laterality Date  . Tubal ligation    . Orif calcaneous fracture Left 10/21/2014    Procedure: OPEN REDUCTION INTERNAL FIXATION (ORIF) LEFT CALCANEOUS FRACTURE;  Surgeon: Rozanna Box, MD;  Location: [place];  Service: Orthopedics;  Laterality: Left;  Marland Kitchen Kyphoplasty N/A 02/17/2015    Procedure: T12 KYPHOPLASTY;  Surgeon: Melina Schools, MD;  Location: [place];  Service: Orthopedics;  Laterality: N/A;  . Colonoscopy    . Hardware removal Left 07/29/2015    Procedure: HARDWARE REMOVAL,LEFT HEEL;  Surgeon: Altamese [place], MD;  Location: [place];  Service: Orthopedics;  Laterality: Left;  . Steriod injection Right 07/29/2015    Procedure: Subtalar Joint injection with Fluoro guidance  ;  Surgeon: Altamese [place], MD;  Location: [place];  Service: Orthopedics;  Laterality: Right;    There were no vitals filed for this visit.       Subjective Assessment - 07/10/16 1033    Subjective Patient presents with complaints of LBP which refers upt to thoracic spine. She was discharged in April from therapy where she did well  she reports. Pain is constant.   Pertinent History bipolar, schitzophrenic, manic, kyphoplasty T-spine, L foot surgery 2015, DDD   Limitations Sitting;Standing;Walking   How long can you sit comfortably? 10 min   How long can you stand comfortably? 4 min (hurts the back of her legs)   How long can you walk comfortably? 10-15 min    Patient Stated Goals Less pain, able to stand/walk longer, would like to be able to do exercises to improve my pain   Currently in Pain? Yes   Pain Score 6    Pain Location Back   Pain Orientation Right;Left;Lower   Pain Descriptors / Indicators Burning;Tightness   Pain Type Chronic pain   Pain Radiating Towards BLE   Pain Onset More than a month ago   Aggravating Factors  sitting, standing   Pain Relieving Factors laying down, hot bath   Effect of Pain on Daily Activities limited            [facility] PT Assessment - 07/10/16 0001    Assessment  Medical Diagnosis B sciatica   Referring Provider Kenn File   Onset Date/Surgical Date 08/24/14   Next MD Visit 08/02/16   Precautions   Precautions None   Balance Screen   Has the patient fallen in the past 6 months Yes  slipping on water in apartment; falls to knees   How many times? 5   Has the patient had a decrease in activity level because of a fear of falling?  No   Is the patient reluctant to leave their home because of a fear of falling?  No   Home Environment   Living Environment Private residence   Living Arrangements Alone   Type of Home Apartment   Home Access Stairs to enter   Entrance Stairs-Number of Steps 15   Prior Function   Level of Independence  Independent   Observation/Other Assessments   Focus on Therapeutic Outcomes (FOTO)  59% limited   ROM / Strength   AROM / PROM / Strength AROM;Strength   AROM   AROM Assessment Site Lumbar   Lumbar Flexion 45   Lumbar Extension 15   Lumbar - Right Side Bend 16   Lumbar - Left Side Bend 12   Lumbar - Right Rotation 50%   Lumbar - Left Rotation 50%   Strength   Overall Strength Comments Grossly 5/5 in BLE except hip ABD 4+/5   Strength Assessment Site Hip;Knee   Flexibility   Soft Tissue Assessment /Muscle Length --  tight in B paraspinals, R hip ER, gluteals and HS   Palpation   Palpation comment mod tenderness of B gluteals along iliac crest                   [facility] Adult PT Treatment/Exercise - 07/10/16 0001    Modalities   Modalities Electrical Stimulation;Moist Heat   Moist Heat Therapy   Number Minutes Moist Heat 15 Minutes   Moist Heat Location Lumbar Spine  and thoracic   Electrical Stimulation   Electrical Stimulation Location lumbar thoracic spine   Electrical Stimulation Action IFC   Electrical Stimulation Parameters 80-150 Hz to tolerance x 15 min   Electrical Stimulation Goals Pain                PT Education - 07/10/16 1215    Education provided Yes   Education Details HEP   Person(s) Educated Patient   Methods Explanation;Demonstration;Handout   Comprehension Verbalized understanding;Returned demonstration             PT Long Term Goals - 07/10/16 1220    PT LONG TERM GOAL #1   Title I with advanced HEP   Time 6   Period Weeks   Status New   PT LONG TERM GOAL #2   Title Decreased pain overall by 50% since evaluation with ADLS   Time 6   Period Weeks   Status New   PT LONG TERM GOAL #3   Title Patient able to stand to do ADLs for 30 min before needing to sit.   Time 6   Period Weeks   Status New   PT LONG TERM GOAL #4   Title Patient able to walk for 30 min before needing stop for pain   Time 6   Period Weeks    Status New               Plan - 07/10/16 1215    Clinical Impression Statement Patient presents with continued c/o lumbar and thoracic spinal pain  with intermittent pain into BLE. She is very anxious with movement of LE's in supine and requires constant cues to keep breathing. She has tight muscles in her back and hips R>L limiting motion. Pain is limiting her tolerance to sitting, standing and walking although patient was able to tolerate sitting 15 min while on estim/heat.   Rehab Potential Fair   PT Frequency 2x / week   PT Duration 6 weeks   PT Treatment/Interventions ADLs/Self Care Home Management;Electrical Stimulation;Moist Heat;Therapeutic exercise;Ultrasound;Neuromuscular re-education;Patient/family education;Manual techniques;Dry needling;Passive range of motion   PT Next Visit Plan lumbar and hip flexibility, core strengthening, manual/DN prn; modalities for pain.   PT Home Exercise Plan lumbar stretches   Consulted and Agree with Plan of Care Patient      Patient will benefit from skilled therapeutic intervention in order to improve the following deficits and impairments:  Decreased range of motion, Pain, Impaired flexibility  Visit Diagnosis: Midline low back pain with sciatica, sciatica laterality unspecified - Plan: PT plan of care cert/re-cert  Pain in thoracic spine - Plan: PT plan of care cert/re-cert      G-Codes - [id number] 1115    Functional Assessment Tool Used FOTO   Functional Limitation Mobility: Walking and moving around   Mobility: Walking and Moving Around Current Status [phone]) At least 40 percent but less than 60 percent impaired, limited or restricted   Mobility: Walking and Moving Around Goal Status [phone]) At least 40 percent but less than 60 percent impaired, limited or restricted       Problem List Patient Active Problem List   Diagnosis Date Noted  . Chronic venous insufficiency 03/09/2016  . Hepatitis C 03/05/2016  . Varicose veins of  lower extremities with complications 123456  . Chronic hepatitis C virus infection ([place]) 12/15/2015  . Allergic rhinitis 12/09/2015  . Insomnia 12/09/2015  . Genital herpes 12/09/2015  . Hyperlipidemia 12/09/2015  . Menopausal symptom 12/09/2015  . Compression fracture of body of thoracic vertebra 02/17/2015  . Lumbar compression fracture, L1-2 endplate  624THL  . Thoracic compression fracture, T12 endplate  624THL  . Fracture of metatarsal bone of right foot, 4th neck 10/22/2014  . Has jumped from building 10/22/2014  . Bipolar 1 disorder ([place])   . Schizoaffective disorder ([place])   . Calcaneal fracture, Left  10/19/2014   Madelyn Flavors PT  07/10/2016, 12:27 PM  [facility] [place], [place], [address] Phone: [phone]   Fax:  [phone]  Name: Lauren Fuentes MRN: [id number] Date of Birth: 1966-02-13

## 2016-07-16 ENCOUNTER — Encounter: Payer: Medicare Other | Admitting: Physical Therapy

## 2016-07-18 ENCOUNTER — Encounter: Payer: Self-pay | Admitting: Physical Therapy

## 2016-07-18 ENCOUNTER — Ambulatory Visit: Payer: Medicare Other | Admitting: Physical Therapy

## 2016-07-18 DIAGNOSIS — M545 Low back pain, unspecified: Secondary | ICD-10-CM

## 2016-07-18 DIAGNOSIS — M546 Pain in thoracic spine: Secondary | ICD-10-CM

## 2016-07-18 DIAGNOSIS — M5440 Lumbago with sciatica, unspecified side: Secondary | ICD-10-CM | POA: Diagnosis not present

## 2016-07-18 NOTE — Therapy (Signed)
[facility] [place], [place], [address] Phone: [phone]   Fax:  [phone]  Physical Therapy Treatment  Patient Details  Name: Lauren Fuentes MRN: [id number] Date of Birth: March 09, 1966 Referring Provider: Kenn File  Encounter Date: 07/18/2016      PT End of Session - 07/18/16 0949    Visit Number 2   Number of Visits 21   Date for PT Re-Evaluation 08/21/16   PT Start Time 0901   PT Stop Time 0958   PT Time Calculation (min) 57 min   Activity Tolerance Patient tolerated treatment well   Behavior During Therapy [facility] for tasks assessed/performed      Past Medical History:  Diagnosis Date  . Arthritis   . Back pain, chronic   . Bipolar 1 disorder ([place])   . Calcaneal fracture, Left  10/19/2014  . Fracture of metatarsal bone of right foot, 4th neck 10/22/2014  . GERD (gastroesophageal reflux disease)   . Headache    hx migraines, tension headaches  . Hepatitis    stated in the past - pt denies this as of 07/28/15)  . Lumbar compression fracture, L1-2 endplate  624THL  . Post traumatic stress disorder (PTSD)   . Schizoaffective disorder ([place])   . Thoracic compression fracture, T12 endplate  624THL    Past Surgical History:  Procedure Laterality Date  . COLONOSCOPY    . HARDWARE REMOVAL Left 07/29/2015   Procedure: HARDWARE REMOVAL,LEFT HEEL;  Surgeon: Altamese [place], MD;  Location: [place];  Service: Orthopedics;  Laterality: Left;  . KYPHOPLASTY N/A 02/17/2015   Procedure: T12 KYPHOPLASTY;  Surgeon: Melina Schools, MD;  Location: [place];  Service: Orthopedics;  Laterality: N/A;  . ORIF CALCANEOUS FRACTURE Left 10/21/2014   Procedure: OPEN REDUCTION INTERNAL FIXATION (ORIF) LEFT CALCANEOUS FRACTURE;  Surgeon: Rozanna Box, MD;  Location: [place];  Service: Orthopedics;  Laterality: Left;  . STERIOD INJECTION Right 07/29/2015   Procedure: Subtalar Joint injection with Fluoro guidance ;  Surgeon: Altamese ,  MD;  Location: [place];  Service: Orthopedics;  Laterality: Right;  . TUBAL LIGATION      There were no vitals filed for this visit.      Subjective Assessment - 07/18/16 0905    Subjective Patient reported some improvement after last treatment yet temporary   Pertinent History bipolar, schitzophrenic, manic, kyphoplasty T-spine, L foot surgery 2015, DDD   Limitations Sitting;Standing;Walking   How long can you sit comfortably? 10 min   How long can you stand comfortably? 4 min (hurts the back of her legs)   How long can you walk comfortably? 10-15 min    Patient Stated Goals Less pain, able to stand/walk longer, would like to be able to do exercises to improve my pain   Currently in Pain? Yes   Pain Score 9    Pain Location Back   Pain Orientation Right;Left;Lower   Pain Descriptors / Indicators Burning;Tightness;Aching   Pain Type Chronic pain   Pain Onset More than a month ago   Pain Frequency Constant   Aggravating Factors  any prolong position   Pain Relieving Factors hot bath and rest                         [facility] Adult PT Treatment/Exercise - 07/18/16 0001      Exercises   Exercises Lumbar     Modalities   Modalities Electrical Stimulation;Moist Heat;Ultrasound     Moist Heat  Therapy   Number Minutes Moist Heat 15 Minutes   Moist Heat Location Lumbar Spine     Electrical Stimulation   Electrical Stimulation Location lumbar thoracic spine   Electrical Stimulation Action IFC   Electrical Stimulation Parameters 80-150hz    Electrical Stimulation Goals Pain  patient seated     Ultrasound   Ultrasound Location low back paraspinals   Ultrasound Parameters 1.5w/cm2/50%/62mhz x12 min  patient sidelying   Ultrasound Goals Pain     Manual Therapy   Manual Therapy Myofascial release;Soft tissue mobilization   Soft tissue mobilization manual and IASTM to low back paraspinals to help decrease muscle tightness and pain, patient sidelying                      PT Long Term Goals - 07/18/16 0950      PT LONG TERM GOAL #1   Title I with advanced HEP   Time 6   Period Weeks   Status On-going     PT LONG TERM GOAL #2   Title Decreased pain overall by 50% since evaluation with ADLS   Baseline No change yet 02/07/16   Time 6   Period Weeks   Status On-going     PT LONG TERM GOAL #3   Title Patient able to stand to do ADLs for 30 min before needing to sit.   Time 6   Period Weeks   Status On-going     PT LONG TERM GOAL #4   Title Patient able to walk for 30 min before needing stop for pain   Baseline 15 min 02/07/16   Time 6   Period Weeks   Status On-going               Plan - 07/18/16 0951    Clinical Impression Statement Patient tolerated treament well with no complaints of pain. Patient reports pain in any prolong positon and relief with heat and rest. Patient was not ready for low level strengthening or stretches today and asked to hold off on that for a few visits. Educated patient on weight shifiting with standing and postue in all positions. Patient tolerated sidelying for [place] and manual STW and sitting foe heat. Patient prefers not to lay on back. All goals ongoing at this time due to patient reported pain deficits.   Rehab Potential Fair   PT Frequency 2x / week   PT Duration 6 weeks   PT Treatment/Interventions ADLs/Self Care Home Management;Electrical Stimulation;Moist Heat;Therapeutic exercise;Ultrasound;Neuromuscular re-education;Patient/family education;Manual techniques;Dry needling;Passive range of motion   PT Next Visit Plan lumbar and hip flexibility, core strengthening, manual/DN prn; modalities for pain.   PT Home Exercise Plan lumbar stretches   Consulted and Agree with Plan of Care Patient      Patient will benefit from skilled therapeutic intervention in order to improve the following deficits and impairments:  Decreased range of motion, Pain, Impaired flexibility  Visit  Diagnosis: Midline low back pain with sciatica, sciatica laterality unspecified  Pain in thoracic spine  Bilateral low back pain without sciatica     Problem List Patient Active Problem List   Diagnosis Date Noted  . Chronic venous insufficiency 03/09/2016  . Hepatitis C 03/05/2016  . Varicose veins of lower extremities with complications 123456  . Chronic hepatitis C virus infection ([place]) 12/15/2015  . Allergic rhinitis 12/09/2015  . Insomnia 12/09/2015  . Genital herpes 12/09/2015  . Hyperlipidemia 12/09/2015  . Menopausal symptom 12/09/2015  . Compression fracture of body  of thoracic vertebra 02/17/2015  . Lumbar compression fracture, L1-2 endplate  624THL  . Thoracic compression fracture, T12 endplate  624THL  . Fracture of metatarsal bone of right foot, 4th neck 10/22/2014  . Has jumped from building 10/22/2014  . Bipolar 1 disorder ([place])   . Schizoaffective disorder ([place])   . Calcaneal fracture, Left  10/19/2014    Kathryn Linarez P, PTA 07/18/2016, 9:58 AM  [facility] [place], [place], [address] Phone: [phone]   Fax:  [phone]  Name: Lauren Fuentes MRN: [id number] Date of Birth: April 01, 1966

## 2016-07-20 DIAGNOSIS — F319 Bipolar disorder, unspecified: Secondary | ICD-10-CM | POA: Diagnosis not present

## 2016-07-26 ENCOUNTER — Other Ambulatory Visit: Payer: Self-pay | Admitting: Family

## 2016-07-26 DIAGNOSIS — B182 Chronic viral hepatitis C: Secondary | ICD-10-CM | POA: Diagnosis not present

## 2016-07-27 ENCOUNTER — Ambulatory Visit: Payer: Medicare Other | Attending: Family Medicine | Admitting: Physical Therapy

## 2016-07-27 DIAGNOSIS — M5440 Lumbago with sciatica, unspecified side: Secondary | ICD-10-CM | POA: Diagnosis not present

## 2016-07-27 DIAGNOSIS — M546 Pain in thoracic spine: Secondary | ICD-10-CM | POA: Insufficient documentation

## 2016-07-27 DIAGNOSIS — M545 Low back pain: Secondary | ICD-10-CM | POA: Insufficient documentation

## 2016-07-27 NOTE — Therapy (Signed)
[facility] [place], [place], [address] Phone: [phone]   Fax:  [phone]  Physical Therapy Treatment  Patient Details  Name: Lauren Fuentes MRN: [id number] Date of Birth: 02-Jun-1966 Referring Provider: Kenn File  Encounter Date: 07/27/2016      PT End of Session - 07/27/16 0905    Visit Number 3   Number of Visits 21   Date for PT Re-Evaluation 08/21/16   PT Start Time 0900      Past Medical History:  Diagnosis Date  . Arthritis   . Back pain, chronic   . Bipolar 1 disorder ([place])   . Calcaneal fracture, Left  10/19/2014  . Fracture of metatarsal bone of right foot, 4th neck 10/22/2014  . GERD (gastroesophageal reflux disease)   . Headache    hx migraines, tension headaches  . Hepatitis    stated in the past - pt denies this as of 07/28/15)  . Lumbar compression fracture, L1-2 endplate  624THL  . Post traumatic stress disorder (PTSD)   . Schizoaffective disorder ([place])   . Thoracic compression fracture, T12 endplate  624THL    Past Surgical History:  Procedure Laterality Date  . COLONOSCOPY    . HARDWARE REMOVAL Left 07/29/2015   Procedure: HARDWARE REMOVAL,LEFT HEEL;  Surgeon: Altamese [place], MD;  Location: [place];  Service: Orthopedics;  Laterality: Left;  . KYPHOPLASTY N/A 02/17/2015   Procedure: T12 KYPHOPLASTY;  Surgeon: Melina Schools, MD;  Location: [place];  Service: Orthopedics;  Laterality: N/A;  . ORIF CALCANEOUS FRACTURE Left 10/21/2014   Procedure: OPEN REDUCTION INTERNAL FIXATION (ORIF) LEFT CALCANEOUS FRACTURE;  Surgeon: Rozanna Box, MD;  Location: [place];  Service: Orthopedics;  Laterality: Left;  . STERIOD INJECTION Right 07/29/2015   Procedure: Subtalar Joint injection with Fluoro guidance ;  Surgeon: Altamese [place], MD;  Location: [place];  Service: Orthopedics;  Laterality: Right;  . TUBAL LIGATION      There were no vitals filed for this visit.      Subjective Assessment -  07/27/16 0906    Subjective I've been doing some low level exercise at home.   Patient Stated Goals Less pain, able to stand/walk longer, would like to be able to do exercises to improve my pain   Pain Score 8    Pain Location Back   Pain Orientation Right;Left;Lower   Pain Descriptors / Indicators Burning;Tightness;Aching   Pain Type Chronic pain   Pain Onset More than a month ago      Treatment:  Nustep level 3 x 24 minutes f/b HMP and IFC x 20 minutes to affected thoracic and lumbar musculature.  Patient felt very good after treatment.                               PT Long Term Goals - 07/18/16 0950      PT LONG TERM GOAL #1   Title I with advanced HEP   Time 6   Period Weeks   Status On-going     PT LONG TERM GOAL #2   Title Decreased pain overall by 50% since evaluation with ADLS   Baseline No change yet 02/07/16   Time 6   Period Weeks   Status On-going     PT LONG TERM GOAL #3   Title Patient able to stand to do ADLs for 30 min before needing to sit.   Time 6   Period  Weeks   Status On-going     PT LONG TERM GOAL #4   Title Patient able to walk for 30 min before needing stop for pain   Baseline 15 min 02/07/16   Time 6   Period Weeks   Status On-going             Patient will benefit from skilled therapeutic intervention in order to improve the following deficits and impairments:  Decreased range of motion, Pain, Impaired flexibility  Visit Diagnosis: Midline low back pain with sciatica, sciatica laterality unspecified  Pain in thoracic spine     Problem List Patient Active Problem List   Diagnosis Date Noted  . Chronic venous insufficiency 03/09/2016  . Hepatitis C 03/05/2016  . Varicose veins of lower extremities with complications 123456  . Chronic hepatitis C virus infection ([place]) 12/15/2015  . Allergic rhinitis 12/09/2015  . Insomnia 12/09/2015  . Genital herpes 12/09/2015  . Hyperlipidemia 12/09/2015  .  Menopausal symptom 12/09/2015  . Compression fracture of body of thoracic vertebra 02/17/2015  . Lumbar compression fracture, L1-2 endplate  624THL  . Thoracic compression fracture, T12 endplate  624THL  . Fracture of metatarsal bone of right foot, 4th neck 10/22/2014  . Has jumped from building 10/22/2014  . Bipolar 1 disorder ([place])   . Schizoaffective disorder ([place])   . Calcaneal fracture, Left  10/19/2014    Preslee Regas, [place] MPT 07/27/2016, 10:31 AM  [facility] [place], [place], [address] Phone: [phone]   Fax:  [phone]  Name: Lauren Fuentes MRN: [id number] Date of Birth: 11/07/1966

## 2016-07-31 ENCOUNTER — Ambulatory Visit: Payer: Medicare Other | Admitting: *Deleted

## 2016-07-31 DIAGNOSIS — M545 Low back pain, unspecified: Secondary | ICD-10-CM

## 2016-07-31 DIAGNOSIS — M5440 Lumbago with sciatica, unspecified side: Secondary | ICD-10-CM | POA: Diagnosis not present

## 2016-07-31 DIAGNOSIS — M546 Pain in thoracic spine: Secondary | ICD-10-CM | POA: Diagnosis not present
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
## 2016-08-02 ENCOUNTER — Ambulatory Visit (INDEPENDENT_AMBULATORY_CARE_PROVIDER_SITE_OTHER): Payer: Medicare Other | Admitting: Family Medicine

## 2016-08-02 ENCOUNTER — Encounter: Payer: Self-pay | Admitting: Family Medicine

## 2016-08-02 VITALS — BP 94/64 | HR 97 | Temp 97.2°F | Ht 63.0 in | Wt 190.2 lb

## 2016-08-02 DIAGNOSIS — M545 Low back pain: Secondary | ICD-10-CM | POA: Diagnosis not present

## 2016-08-02 DIAGNOSIS — B1920 Unspecified viral hepatitis C without hepatic coma: Secondary | ICD-10-CM | POA: Diagnosis not present

## 2016-08-02 DIAGNOSIS — F259 Schizoaffective disorder, unspecified: Secondary | ICD-10-CM

## 2016-08-02 DIAGNOSIS — F319 Bipolar disorder, unspecified: Secondary | ICD-10-CM | POA: Diagnosis not present

## 2016-08-02 DIAGNOSIS — Z23 Encounter for immunization: Secondary | ICD-10-CM | POA: Diagnosis not present

## 2016-08-02 MED ORDER — IBUPROFEN 600 MG PO TABS: 600.0000 mg | ORAL_TABLET | Freq: Three times a day (TID) | ORAL | 2 refills | Status: DC | PRN

## 2016-08-02 MED ORDER — VITAMIN D (ERGOCALCIFEROL) 1.25 MG (50000 UNIT) PO CAPS: 50000.0000 [IU] | ORAL_CAPSULE | ORAL | 1 refills | Status: DC
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
## 2016-08-02 NOTE — Addendum Note (Signed)
Addended by: Karle Plumber on: 08/02/2016 09:39 AM   Modules accepted: Orders

## 2016-08-02 NOTE — Patient Instructions (Signed)
Great to see you!  Please call [facility] for an appointment at this number: [phone]

## 2016-08-03 ENCOUNTER — Encounter: Payer: Self-pay | Admitting: Physical Therapy

## 2016-08-03 ENCOUNTER — Ambulatory Visit: Payer: Medicare Other | Admitting: Physical Therapy

## 2016-08-03 DIAGNOSIS — M5440 Lumbago with sciatica, unspecified side: Secondary | ICD-10-CM | POA: Diagnosis not present

## 2016-08-03 DIAGNOSIS — M545 Low back pain: Secondary | ICD-10-CM | POA: Diagnosis not present

## 2016-08-03 DIAGNOSIS — M546 Pain in thoracic spine: Secondary | ICD-10-CM
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
## 2016-08-07 ENCOUNTER — Ambulatory Visit: Payer: Medicare Other | Admitting: Physical Therapy

## 2016-08-07 ENCOUNTER — Encounter: Payer: Self-pay | Admitting: Physical Therapy

## 2016-08-07 DIAGNOSIS — M546 Pain in thoracic spine: Secondary | ICD-10-CM

## 2016-08-07 DIAGNOSIS — M545 Low back pain: Secondary | ICD-10-CM | POA: Diagnosis not present

## 2016-08-07 DIAGNOSIS — M5440 Lumbago with sciatica, unspecified side: Secondary | ICD-10-CM | POA: Diagnosis not present
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
## 2016-08-09 ENCOUNTER — Ambulatory Visit: Payer: Medicare Other | Admitting: Physical Therapy

## 2016-08-09 ENCOUNTER — Encounter: Payer: Self-pay | Admitting: Physical Therapy

## 2016-08-09 DIAGNOSIS — M546 Pain in thoracic spine: Secondary | ICD-10-CM | POA: Diagnosis not present

## 2016-08-09 DIAGNOSIS — M545 Low back pain: Secondary | ICD-10-CM | POA: Diagnosis not present

## 2016-08-09 DIAGNOSIS — M5440 Lumbago with sciatica, unspecified side: Secondary | ICD-10-CM

## 2016-08-09 NOTE — Therapy (Signed)
[facility] [place], [place], [address] Phone: [phone]   Fax:  [phone]  Physical Therapy Treatment  Patient Details  Name: Lauren Fuentes MRN: [id number] Date of Birth: 1966/07/20 Referring Provider: Kenn File  Encounter Date: 08/09/2016      PT End of Session - 08/09/16 0903    Visit Number 7   Number of Visits 21   Date for PT Re-Evaluation 08/21/16   PT Start Time 0900   PT Stop Time 0951   PT Time Calculation (min) 51 min   Activity Tolerance Patient tolerated treatment well   Behavior During Therapy [facility] for tasks assessed/performed      Past Medical History:  Diagnosis Date  . Arthritis   . Back pain, chronic   . Bipolar 1 disorder ([place])   . Calcaneal fracture, Left  10/19/2014  . Fracture of metatarsal bone of right foot, 4th neck 10/22/2014  . GERD (gastroesophageal reflux disease)   . Headache    hx migraines, tension headaches  . Hepatitis    stated in the past - pt denies this as of 07/28/15)  . Lumbar compression fracture, L1-2 endplate  624THL  . Post traumatic stress disorder (PTSD)   . Schizoaffective disorder ([place])   . Thoracic compression fracture, T12 endplate  624THL    Past Surgical History:  Procedure Laterality Date  . COLONOSCOPY    . HARDWARE REMOVAL Left 07/29/2015   Procedure: HARDWARE REMOVAL,LEFT HEEL;  Surgeon: Altamese Carbon, MD;  Location: [place];  Service: Orthopedics;  Laterality: Left;  . KYPHOPLASTY N/A 02/17/2015   Procedure: T12 KYPHOPLASTY;  Surgeon: Melina Schools, MD;  Location: [place];  Service: Orthopedics;  Laterality: N/A;  . ORIF CALCANEOUS FRACTURE Left 10/21/2014   Procedure: OPEN REDUCTION INTERNAL FIXATION (ORIF) LEFT CALCANEOUS FRACTURE;  Surgeon: Rozanna Box, MD;  Location: [place];  Service: Orthopedics;  Laterality: Left;  . STERIOD INJECTION Right 07/29/2015   Procedure: Subtalar Joint injection with Fluoro guidance ;  Surgeon: Altamese [place],  MD;  Location: [place];  Service: Orthopedics;  Laterality: Right;  . TUBAL LIGATION      There were no vitals filed for this visit.      Subjective Assessment - 08/09/16 0902    Subjective Reports that her back is still tight but better.   Pertinent History bipolar, schitzophrenic, manic, kyphoplasty T-spine, L foot surgery 2015, DDD   Limitations Sitting;Standing;Walking   How long can you sit comfortably? 10 min   How long can you stand comfortably? 4 min (hurts the back of her legs)   How long can you walk comfortably? 10-15 min    Currently in Pain? Yes   Pain Score 7    Pain Location Back   Pain Orientation Right;Left;Mid;Lower   Pain Descriptors / Indicators Tightness   Pain Type Chronic pain   Pain Onset More than a month ago            [facility] PT Assessment - 08/09/16 0001      Assessment   Medical Diagnosis B sciatica   Onset Date/Surgical Date 08/24/14     Precautions   Precautions None                     [facility] Adult PT Treatment/Exercise - 08/09/16 0001      Lumbar Exercises: Aerobic   Stationary Bike NuStep L6 x15 min     Modalities   Modalities Electrical Stimulation;Moist Heat     Moist  Heat Therapy   Number Minutes Moist Heat 15 Minutes   Moist Heat Location Lumbar Spine     Electrical Stimulation   Electrical Stimulation Location B lumbar/ thoracic paraspinals   Electrical Stimulation Action IFC   Electrical Stimulation Parameters 1-10 hz x15 min   Electrical Stimulation Goals Pain     Manual Therapy   Manual Therapy Soft tissue mobilization   Soft tissue mobilization STW/MFR to B thoracolumbar paraspinals to decrease pain and tightness in prone                     PT Long Term Goals - 08/03/16 0744      PT LONG TERM GOAL #1   Title I with advanced HEP   Time 6   Period Weeks   Status On-going     PT LONG TERM GOAL #2   Title Decreased pain overall by 50% since evaluation with ADLS   Time 6   Period Weeks    Status On-going     PT LONG TERM GOAL #3   Title Patient able to stand to do ADLs for 30 min before needing to sit.   Time 6   Period Weeks   Status On-going               Plan - 08/09/16 [id number]    Clinical Impression Statement Patient tolerated today's treatment fairly well as she had increased difficulty with NuStep with workload increased to level 6. Patient required short rest breaks while on NuStep today. Patient had increased sensitivity to B lumbothoracic paraspinals especially the L paraspinals. Minimal tightness noted in B thoracolumbar paraspinals today in prone. Modalities continued in prone with normal response. Patient noted sensitivty with removal of electrode from low back. Patient was educated to transitiion from prone to sidelying then to sitting to prevent further pain.    Rehab Potential Fair   PT Frequency 2x / week   PT Duration 6 weeks   PT Treatment/Interventions ADLs/Self Care Home Management;Electrical Stimulation;Moist Heat;Therapeutic exercise;Ultrasound;Neuromuscular re-education;Patient/family education;Manual techniques;Dry needling;Passive range of motion   PT Next Visit Plan Continue with progressing therex as symptoms dictate but continue modalities and manual therapy per MPT POC.   PT Home Exercise Plan lumbar stretches   Consulted and Agree with Plan of Care Patient      Patient will benefit from skilled therapeutic intervention in order to improve the following deficits and impairments:  Decreased range of motion, Pain, Impaired flexibility  Visit Diagnosis: Midline low back pain with sciatica, sciatica laterality unspecified  Pain in thoracic spine     Problem List Patient Active Problem List   Diagnosis Date Noted  . Chronic venous insufficiency 03/09/2016  . Hepatitis C 03/05/2016  . Varicose veins of lower extremities with complications 123456  . Chronic hepatitis C virus infection ([place]) 12/15/2015  . Allergic rhinitis  12/09/2015  . Insomnia 12/09/2015  . Genital herpes 12/09/2015  . Hyperlipidemia 12/09/2015  . Menopausal symptom 12/09/2015  . Compression fracture of body of thoracic vertebra 02/17/2015  . Lumbar compression fracture, L1-2 endplate  624THL  . Thoracic compression fracture, T12 endplate  624THL  . Fracture of metatarsal bone of right foot, 4th neck 10/22/2014  . Has jumped from building 10/22/2014  . Bipolar 1 disorder ([place])   . Schizoaffective disorder ([place])   . Calcaneal fracture, Left  10/19/2014    Wynelle Fanny, PTA 08/09/2016, 9:56 AM  [facility] [address] [place], [place], [address]  Phone: [phone]   Fax:  [phone]  Name: Chandell Abby MRN: [id number] Date of Birth: 1966-03-21

## 2016-08-14 ENCOUNTER — Ambulatory Visit: Payer: Medicare Other | Admitting: Physical Therapy

## 2016-08-14 DIAGNOSIS — M545 Low back pain: Secondary | ICD-10-CM | POA: Diagnosis not present

## 2016-08-14 DIAGNOSIS — M546 Pain in thoracic spine: Secondary | ICD-10-CM

## 2016-08-14 DIAGNOSIS — M5440 Lumbago with sciatica, unspecified side: Secondary | ICD-10-CM | POA: Diagnosis not present

## 2016-08-14 NOTE — Therapy (Signed)
[facility] [place], [place], [address] Phone: [phone]   Fax:  [phone]  Physical Therapy Treatment  Patient Details  Name: Lauren Fuentes MRN: [id number] Date of Birth: January 25, 1966 Referring Provider: Kenn File  Encounter Date: 08/14/2016      PT End of Session - 08/14/16 0915    Visit Number 8   Number of Visits 21   Date for PT Re-Evaluation 08/21/16   PT Start Time 0900   Activity Tolerance Patient tolerated treatment well   Behavior During Therapy [facility] for tasks assessed/performed      Past Medical History:  Diagnosis Date  . Arthritis   . Back pain, chronic   . Bipolar 1 disorder ([place])   . Calcaneal fracture, Left  10/19/2014  . Fracture of metatarsal bone of right foot, 4th neck 10/22/2014  . GERD (gastroesophageal reflux disease)   . Headache    hx migraines, tension headaches  . Hepatitis    stated in the past - pt denies this as of 07/28/15)  . Lumbar compression fracture, L1-2 endplate  624THL  . Post traumatic stress disorder (PTSD)   . Schizoaffective disorder ([place])   . Thoracic compression fracture, T12 endplate  624THL    Past Surgical History:  Procedure Laterality Date  . COLONOSCOPY    . HARDWARE REMOVAL Left 07/29/2015   Procedure: HARDWARE REMOVAL,LEFT HEEL;  Surgeon: Altamese [place], MD;  Location: [place];  Service: Orthopedics;  Laterality: Left;  . KYPHOPLASTY N/A 02/17/2015   Procedure: T12 KYPHOPLASTY;  Surgeon: Melina Schools, MD;  Location: [place];  Service: Orthopedics;  Laterality: N/A;  . ORIF CALCANEOUS FRACTURE Left 10/21/2014   Procedure: OPEN REDUCTION INTERNAL FIXATION (ORIF) LEFT CALCANEOUS FRACTURE;  Surgeon: Rozanna Box, MD;  Location: [place];  Service: Orthopedics;  Laterality: Left;  . STERIOD INJECTION Right 07/29/2015   Procedure: Subtalar Joint injection with Fluoro guidance ;  Surgeon: Altamese [place], MD;  Location: [place];  Service: Orthopedics;   Laterality: Right;  . TUBAL LIGATION      There were no vitals filed for this visit.      Subjective Assessment - 08/14/16 0915    Subjective My pain is about a 6/10.                         [facility] Adult PT Treatment/Exercise - 08/14/16 0001      Exercises   Exercises Knee/Hip     Lumbar Exercises: Aerobic   Stationary Bike Level 6 x 15 minutes.     Moist Heat Therapy   Number Minutes Moist Heat 15 Minutes   Moist Heat Location Lumbar Spine     Electrical Stimulation   Electrical Stimulation Location Bil thoracic and and lumbar region.   Electrical Stimulation Action IFC   Electrical Stimulation Parameters 80-150 Hz x 20 minutes.   Electrical Stimulation Goals Pain     Manual Therapy   Manual Therapy Soft tissue mobilization   Soft tissue mobilization Seated with patient resting arms and uper body on pillows on plinth:  8 minutes to bilateral thoracic and lumbar paraspinals.                     PT Long Term Goals - 08/03/16 0744      PT LONG TERM GOAL #1   Title I with advanced HEP   Time 6   Period Weeks   Status On-going     PT LONG  TERM GOAL #2   Title Decreased pain overall by 50% since evaluation with ADLS   Time 6   Period Weeks   Status On-going     PT LONG TERM GOAL #3   Title Patient able to stand to do ADLs for 30 min before needing to sit.   Time 6   Period Weeks   Status On-going             Patient will benefit from skilled therapeutic intervention in order to improve the following deficits and impairments:  Decreased range of motion, Pain, Impaired flexibility  Visit Diagnosis: Midline low back pain with sciatica, sciatica laterality unspecified  Pain in thoracic spine     Problem List Patient Active Problem List   Diagnosis Date Noted  . Chronic venous insufficiency 03/09/2016  . Hepatitis C 03/05/2016  . Varicose veins of lower extremities with complications 123456  . Chronic hepatitis C  virus infection ([place]) 12/15/2015  . Allergic rhinitis 12/09/2015  . Insomnia 12/09/2015  . Genital herpes 12/09/2015  . Hyperlipidemia 12/09/2015  . Menopausal symptom 12/09/2015  . Compression fracture of body of thoracic vertebra 02/17/2015  . Lumbar compression fracture, L1-2 endplate  624THL  . Thoracic compression fracture, T12 endplate  624THL  . Fracture of metatarsal bone of right foot, 4th neck 10/22/2014  . Has jumped from building 10/22/2014  . Bipolar 1 disorder ([place])   . Schizoaffective disorder ([place])   . Calcaneal fracture, Left  10/19/2014    [name], [place] MPT 08/14/2016, 10:06 AM  [facility] [place], [place], [address] Phone: [phone]   Fax:  [phone]  Name: Lauren Fuentes MRN: [id number] Date of Birth: 31-Dec-1965

## 2016-08-16 ENCOUNTER — Encounter: Payer: Self-pay | Admitting: Physical Therapy

## 2016-08-16 ENCOUNTER — Ambulatory Visit: Payer: Medicare Other | Admitting: Physical Therapy

## 2016-08-16 DIAGNOSIS — M546 Pain in thoracic spine: Secondary | ICD-10-CM | POA: Diagnosis not present

## 2016-08-16 DIAGNOSIS — M5440 Lumbago with sciatica, unspecified side: Secondary | ICD-10-CM | POA: Diagnosis not present

## 2016-08-16 DIAGNOSIS — M545 Low back pain, unspecified: Secondary | ICD-10-CM

## 2016-08-16 NOTE — Patient Instructions (Signed)
Pelvic Tilt: Posterior - Legs Bent (Supine)   Tighten stomach and flatten back by rolling pelvis down. Hold _10___ seconds. Relax. Repeat _10-30___ times per set. Do __2__ sets per session. Do _2___ sessions per day.   Bent Leg Lift (Hook-Lying)   Tighten stomach and slowly raise right leg _5___ inches from floor. Keep trunk rigid. Hold _3___ seconds. Repeat _10___ times per set. Do ___2-3_ sets per session. Do __2__ sessions per day.    

## 2016-08-16 NOTE — Therapy (Signed)
[facility] [place], [place], [address] Phone: [phone]   Fax:  [phone]  Physical Therapy Treatment  Patient Details  Name: Lauren Fuentes MRN: [id number] Date of Birth: 1966-05-29 Referring Provider: Kenn File  Encounter Date: 08/16/2016      PT End of Session - 08/16/16 0941    Visit Number 9   Number of Visits 21   Date for PT Re-Evaluation 08/21/16   PT Start Time 0858   PT Stop Time 0951   PT Time Calculation (min) 53 min   Activity Tolerance Patient tolerated treatment well   Behavior During Therapy [facility] for tasks assessed/performed      Past Medical History:  Diagnosis Date  . Arthritis   . Back pain, chronic   . Bipolar 1 disorder ([place])   . Calcaneal fracture, Left  10/19/2014  . Fracture of metatarsal bone of right foot, 4th neck 10/22/2014  . GERD (gastroesophageal reflux disease)   . Headache    hx migraines, tension headaches  . Hepatitis    stated in the past - pt denies this as of 07/28/15)  . Lumbar compression fracture, L1-2 endplate  624THL  . Post traumatic stress disorder (PTSD)   . Schizoaffective disorder ([place])   . Thoracic compression fracture, T12 endplate  624THL    Past Surgical History:  Procedure Laterality Date  . COLONOSCOPY    . HARDWARE REMOVAL Left 07/29/2015   Procedure: HARDWARE REMOVAL,LEFT HEEL;  Surgeon: Altamese [place], MD;  Location: [place];  Service: Orthopedics;  Laterality: Left;  . KYPHOPLASTY N/A 02/17/2015   Procedure: T12 KYPHOPLASTY;  Surgeon: Melina Schools, MD;  Location: [place];  Service: Orthopedics;  Laterality: N/A;  . ORIF CALCANEOUS FRACTURE Left 10/21/2014   Procedure: OPEN REDUCTION INTERNAL FIXATION (ORIF) LEFT CALCANEOUS FRACTURE;  Surgeon: Rozanna Box, MD;  Location: [place];  Service: Orthopedics;  Laterality: Left;  . STERIOD INJECTION Right 07/29/2015   Procedure: Subtalar Joint injection with Fluoro guidance ;  Surgeon: Altamese [place],  MD;  Location: [place];  Service: Orthopedics;  Laterality: Right;  . TUBAL LIGATION      There were no vitals filed for this visit.      Subjective Assessment - 08/16/16 0901    Subjective Patient arrived with little pain today and reports feeling better overall.   Pertinent History bipolar, schitzophrenic, manic, kyphoplasty T-spine, L foot surgery 2015, DDD   Limitations Sitting;Standing;Walking   How long can you sit comfortably? 10 min   How long can you stand comfortably? 4 min (hurts the back of her legs)   How long can you walk comfortably? 10-15 min    Patient Stated Goals Less pain, able to stand/walk longer, would like to be able to do exercises to improve my pain   Currently in Pain? Yes   Pain Score 3    Pain Location Back   Pain Orientation Right;Left;Lower   Pain Descriptors / Indicators Sore   Pain Type Chronic pain   Pain Onset More than a month ago   Aggravating Factors  increased activity   Pain Relieving Factors at rest                         [facility] Adult PT Treatment/Exercise - 08/16/16 0001      Lumbar Exercises: Aerobic   Stationary Bike Level 6 x 15 minutes.     Lumbar Exercises: Supine   Ab Set 5 seconds  2x10  AB Set Limitations required verbal, tactile and visual cues   Bent Knee Raise 3 seconds  2x10   Bridge 10 reps;3 seconds   Straight Leg Raise 3 seconds  2x10     Moist Heat Therapy   Number Minutes Moist Heat 15 Minutes   Moist Heat Location Lumbar Spine     Electrical Stimulation   Electrical Stimulation Location Bil thoracic and and lumbar region.   Electrical Stimulation Action IFC   Electrical Stimulation Parameters 80-150hz    Electrical Stimulation Goals Pain                PT Education - 08/16/16 0940    Education provided Yes   Education Details HEP   Person(s) Educated Patient   Methods Explanation;Demonstration;Handout;Tactile cues;Verbal cues   Comprehension Verbalized understanding;Returned  demonstration             PT Long Term Goals - 08/16/16 [id number]      PT LONG TERM GOAL #1   Title I with advanced HEP   Time 6   Period Weeks   Status On-going     PT LONG TERM GOAL #2   Title Decreased pain overall by 50% since evaluation with ADLS   Baseline No change yet 02/07/16   Time 6   Period Weeks   Status On-going  patient reprted 25%-30% improved with ADL's 08/16/16     PT LONG TERM GOAL #3   Title Patient able to stand to do ADLs for 30 min before needing to sit.   Time 6   Period Weeks   Status On-going  able to perform ADL's for 37min 08/16/16     PT LONG TERM GOAL #4   Title Patient able to walk for 30 min before needing stop for pain   Baseline 15 min 02/07/16   Time 6   Period Weeks   Status On-going  walking 20 min 08/16/16               Plan - 08/16/16 0942    Clinical Impression Statement Patient arrived today with little pain and overall able to perform ADL's and standing with less pain and for longer amount of time. Patient was educated on all posture awareness techniques with ADL's and beding today to protect back. Today focused on posture and core activation techniques. paient was given HEP with verbal, tactile and visual cues for correct technique. Patient had difficulty to stay on task with the improtance of core and back stabilization and strengthening to protect back from re-injury. Patient goals ongoing at this time due to pain reported deficits.   Rehab Potential Fair   PT Frequency 2x / week   PT Duration 6 weeks   PT Treatment/Interventions ADLs/Self Care Home Management;Electrical Stimulation;Moist Heat;Therapeutic exercise;Ultrasound;Neuromuscular re-education;Patient/family education;Manual techniques;Dry needling;Passive range of motion   PT Next Visit Plan Continue with progressing therex/core strengthening and manual therapy/modalities PRN (MD Wendi Snipes 09/02/16)   Consulted and Agree with Plan of Care Patient      Patient will  benefit from skilled therapeutic intervention in order to improve the following deficits and impairments:  Decreased range of motion, Pain, Impaired flexibility  Visit Diagnosis: Midline low back pain with sciatica, sciatica laterality unspecified  Pain in thoracic spine  Bilateral low back pain without sciatica     Problem List Patient Active Problem List   Diagnosis Date Noted  . Chronic venous insufficiency 03/09/2016  . Hepatitis C 03/05/2016  . Varicose veins of lower extremities with complications 123456  .  Chronic hepatitis C virus infection ([place]) 12/15/2015  . Allergic rhinitis 12/09/2015  . Insomnia 12/09/2015  . Genital herpes 12/09/2015  . Hyperlipidemia 12/09/2015  . Menopausal symptom 12/09/2015  . Compression fracture of body of thoracic vertebra 02/17/2015  . Lumbar compression fracture, L1-2 endplate  624THL  . Thoracic compression fracture, T12 endplate  624THL  . Fracture of metatarsal bone of right foot, 4th neck 10/22/2014  . Has jumped from building 10/22/2014  . Bipolar 1 disorder ([place])   . Schizoaffective disorder ([place])   . Calcaneal fracture, Left  10/19/2014    Hiba Garry P, PTA 08/16/2016, 9:54 AM  [facility] [place], [place], [address] Phone: [phone]   Fax:  [phone]  Name: Kenzy Hysmith MRN: [id number] Date of Birth: August 29, 1966

## 2016-08-21 ENCOUNTER — Ambulatory Visit: Payer: Medicare Other | Admitting: Physical Therapy

## 2016-08-21 ENCOUNTER — Encounter: Payer: Self-pay | Admitting: Physical Therapy

## 2016-08-21 ENCOUNTER — Encounter: Payer: Medicare Other | Admitting: Physical Therapy

## 2016-08-21 DIAGNOSIS — M546 Pain in thoracic spine: Secondary | ICD-10-CM

## 2016-08-21 DIAGNOSIS — M5440 Lumbago with sciatica, unspecified side: Secondary | ICD-10-CM | POA: Diagnosis not present

## 2016-08-21 DIAGNOSIS — M545 Low back pain: Secondary | ICD-10-CM | POA: Diagnosis not present

## 2016-08-21 NOTE — Therapy (Signed)
[facility] [place], [place], [address] Phone: [phone]   Fax:  [phone]  Physical Therapy Treatment  Patient Details  Name: Lauren Fuentes MRN: [id number] Date of Birth: 1966/03/15 Referring Provider: Kenn File  Encounter Date: 08/21/2016      PT End of Session - 08/21/16 0905    Visit Number 10   Number of Visits 21   Date for PT Re-Evaluation 08/21/16   PT Start Time 0900   PT Stop Time 0949   PT Time Calculation (min) 49 min   Activity Tolerance Patient tolerated treatment well   Behavior During Therapy [facility] for tasks assessed/performed      Past Medical History:  Diagnosis Date  . Arthritis   . Back pain, chronic   . Bipolar 1 disorder ([place])   . Calcaneal fracture, Left  10/19/2014  . Fracture of metatarsal bone of right foot, 4th neck 10/22/2014  . GERD (gastroesophageal reflux disease)   . Headache    hx migraines, tension headaches  . Hepatitis    stated in the past - pt denies this as of 07/28/15)  . Lumbar compression fracture, L1-2 endplate  624THL  . Post traumatic stress disorder (PTSD)   . Schizoaffective disorder ([place])   . Thoracic compression fracture, T12 endplate  624THL    Past Surgical History:  Procedure Laterality Date  . COLONOSCOPY    . HARDWARE REMOVAL Left 07/29/2015   Procedure: HARDWARE REMOVAL,LEFT HEEL;  Surgeon: Altamese [place], MD;  Location: [place];  Service: Orthopedics;  Laterality: Left;  . KYPHOPLASTY N/A 02/17/2015   Procedure: T12 KYPHOPLASTY;  Surgeon: Melina Schools, MD;  Location: [place];  Service: Orthopedics;  Laterality: N/A;  . ORIF CALCANEOUS FRACTURE Left 10/21/2014   Procedure: OPEN REDUCTION INTERNAL FIXATION (ORIF) LEFT CALCANEOUS FRACTURE;  Surgeon: Rozanna Box, MD;  Location: [place];  Service: Orthopedics;  Laterality: Left;  . STERIOD INJECTION Right 07/29/2015   Procedure: Subtalar Joint injection with Fluoro guidance ;  Surgeon: Altamese Hobson, MD;  Location: [place];  Service: Orthopedics;  Laterality: Right;  . TUBAL LIGATION      There were no vitals filed for this visit.      Subjective Assessment - 08/21/16 0902    Subjective Reports that she has shoulder and low back pain today. Reports that her calves have been hurting as well and had a bruise along medial malleoli and has increased bilateral foot pain. Foot pain has really limited her walking and prolonged standing ability.   Pertinent History bipolar, schitzophrenic, manic, kyphoplasty T-spine, L foot surgery 2015, DDD   Limitations Sitting;Standing;Walking   How long can you sit comfortably? 10 min   How long can you stand comfortably? 4 min (hurts the back of her legs)   How long can you walk comfortably? 10-15 min    Patient Stated Goals Less pain, able to stand/walk longer, would like to be able to do exercises to improve my pain   Currently in Pain? Yes   Pain Score 7    Pain Location Back   Pain Orientation Right;Left;Lower   Pain Descriptors / Indicators Aching   Pain Type Chronic pain   Pain Onset More than a month ago   Multiple Pain Sites Yes   Pain Score 8   Pain Location Foot   Pain Orientation Right;Left   Pain Descriptors / Indicators Shooting   Pain Type Chronic pain   Pain Frequency Constant  [facility] PT Assessment - 08/21/16 0001      Assessment   Medical Diagnosis B sciatica   Onset Date/Surgical Date 08/24/14     Precautions   Precautions None                     [facility] Adult PT Treatment/Exercise - 08/21/16 0001      Lumbar Exercises: Aerobic   Stationary Bike L5 x 12 min     Modalities   Modalities Electrical Stimulation;Moist Heat     Moist Heat Therapy   Number Minutes Moist Heat 15 Minutes   Moist Heat Location Lumbar Spine     Electrical Stimulation   Electrical Stimulation Location B thoracic/lumbar paraspinals   Electrical Stimulation Action IFC   Electrical Stimulation Parameters 80-150  hz x15 min   Electrical Stimulation Goals Pain     Manual Therapy   Manual Therapy Soft tissue mobilization   Soft tissue mobilization STW in L sidelying to B thoracic/lumbar paraspinals to decrease pain and tightness                     PT Long Term Goals - 08/16/16 [id number]      PT LONG TERM GOAL #1   Title I with advanced HEP   Time 6   Period Weeks   Status On-going     PT LONG TERM GOAL #2   Title Decreased pain overall by 50% since evaluation with ADLS   Baseline No change yet 02/07/16   Time 6   Period Weeks   Status On-going  patient reprted 25%-30% improved with ADL's 08/16/16     PT LONG TERM GOAL #3   Title Patient able to stand to do ADLs for 30 min before needing to sit.   Time 6   Period Weeks   Status On-going  able to perform ADL's for 61min 08/16/16     PT LONG TERM GOAL #4   Title Patient able to walk for 30 min before needing stop for pain   Baseline 15 min 02/07/16   Time 6   Period Weeks   Status On-going  walking 20 min 08/16/16               Plan - 08/21/16 [id number]    Clinical Impression Statement Patient arrived to treatment with increased pain to which she contributed to increased walking and jogging as well as the damp, rainy weather. Patient very fatigued during today's treatment secondary to taking sleeping medication per patient report. Increased sensitivity and minimal to moderate tightness with manual therapy to B thoracolumbar paraspinals especially the L side. Burning as well as stinging also reported with today's manual therapy. Manual therapy completed in L sidelying and modaliites completed in supine with bolster under LEs per patient request. Normal modalities response noted following removal of the modalities.    Rehab Potential Fair   PT Frequency 2x / week   PT Duration 6 weeks   PT Treatment/Interventions ADLs/Self Care Home Management;Electrical Stimulation;Moist Heat;Therapeutic exercise;Ultrasound;Neuromuscular  re-education;Patient/family education;Manual techniques;Dry needling;Passive range of motion   PT Next Visit Plan Continue with progressing therex/core strengthening and manual therapy/modalities PRN (MD Wendi Snipes 09/02/16)   PT Home Exercise Plan lumbar stretches   Consulted and Agree with Plan of Care Patient      Patient will benefit from skilled therapeutic intervention in order to improve the following deficits and impairments:  Decreased range of motion, Pain, Impaired flexibility  Visit Diagnosis: Midline low back pain  with sciatica, sciatica laterality unspecified  Pain in thoracic spine     Problem List Patient Active Problem List   Diagnosis Date Noted  . Chronic venous insufficiency 03/09/2016  . Hepatitis C 03/05/2016  . Varicose veins of lower extremities with complications [id number]  . Chronic hepatitis C virus infection ([place]) 12/15/2015  . Allergic rhinitis 12/09/2015  . Insomnia 12/09/2015  . Genital herpes 12/09/2015  . Hyperlipidemia 12/09/2015  . Menopausal symptom 12/09/2015  . Compression fracture of body of thoracic vertebra 02/17/2015  . Lumbar compression fracture, L1-2 endplate  624THL  . Thoracic compression fracture, T12 endplate  624THL  . Fracture of metatarsal bone of right foot, 4th neck 10/22/2014  . Has jumped from building 10/22/2014  . Bipolar 1 disorder ([place])   . Schizoaffective disorder ([place])   . Calcaneal fracture, Left  10/19/2014    Wynelle Fanny, PTA 08/21/2016, 10:41 AM  [facility] [address] [place], [place], [address] Phone: [phone]   Fax:  [phone]  Name: Lauren Fuentes MRN: [id number] Date of Birth: 1966/11/29   Madelyn Flavors, PT 08/21/16 12:40 PM [facility] [address] [place], [place], [address] Phone: [phone]   Fax:  [phone]

## 2016-08-23 ENCOUNTER — Ambulatory Visit: Payer: Medicare Other | Admitting: *Deleted

## 2016-08-23 DIAGNOSIS — M546 Pain in thoracic spine: Secondary | ICD-10-CM

## 2016-08-23 DIAGNOSIS — M5440 Lumbago with sciatica, unspecified side: Secondary | ICD-10-CM

## 2016-08-23 DIAGNOSIS — M545 Low back pain: Secondary | ICD-10-CM | POA: Diagnosis not present

## 2016-08-23 NOTE — Therapy (Signed)
[facility] [place], [place], [address] Phone: [phone]   Fax:  [phone]  Physical Therapy Treatment  Patient Details  Name: Lauren Fuentes MRN: [id number] Date of Birth: 06/14/1966 Referring Provider: Kenn File  Encounter Date: 08/23/2016      PT End of Session - 08/23/16 0916    Visit Number 11   Number of Visits 21   Date for PT Re-Evaluation 08/21/16   PT Start Time 0900   PT Stop Time 0950   PT Time Calculation (min) 50 min      Past Medical History:  Diagnosis Date  . Arthritis   . Back pain, chronic   . Bipolar 1 disorder ([place])   . Calcaneal fracture, Left  10/19/2014  . Fracture of metatarsal bone of right foot, 4th neck 10/22/2014  . GERD (gastroesophageal reflux disease)   . Headache    hx migraines, tension headaches  . Hepatitis    stated in the past - pt denies this as of 07/28/15)  . Lumbar compression fracture, L1-2 endplate  22/29/7989  . Post traumatic stress disorder (PTSD)   . Schizoaffective disorder ([place])   . Thoracic compression fracture, T12 endplate  21/19/4174    Past Surgical History:  Procedure Laterality Date  . COLONOSCOPY    . HARDWARE REMOVAL Left 07/29/2015   Procedure: HARDWARE REMOVAL,LEFT HEEL;  Surgeon: Altamese [place], MD;  Location: [place];  Service: Orthopedics;  Laterality: Left;  . KYPHOPLASTY N/A 02/17/2015   Procedure: T12 KYPHOPLASTY;  Surgeon: Melina Schools, MD;  Location: [place];  Service: Orthopedics;  Laterality: N/A;  . ORIF CALCANEOUS FRACTURE Left 10/21/2014   Procedure: OPEN REDUCTION INTERNAL FIXATION (ORIF) LEFT CALCANEOUS FRACTURE;  Surgeon: Rozanna Box, MD;  Location: [place];  Service: Orthopedics;  Laterality: Left;  . STERIOD INJECTION Right 07/29/2015   Procedure: Subtalar Joint injection with Fluoro guidance ;  Surgeon: Altamese , MD;  Location: [place];  Service: Orthopedics;  Laterality: Right;  . TUBAL LIGATION      There were no vitals  filed for this visit.      Subjective Assessment - 08/23/16 0912    Subjective Reports that she has shoulder and low back pain today. Reports that her calves have been hurting as well and had a bruise along medial malleoli and has increased bilateral foot pain. Foot pain has really limited her walking and prolonged standing ability.   Pertinent History bipolar, schitzophrenic, manic, kyphoplasty T-spine, L foot surgery 2015, DDD   Limitations Sitting;Standing;Walking   How long can you sit comfortably? 10 min   How long can you stand comfortably? 4 min (hurts the back of her legs)   How long can you walk comfortably? 10-15 min    Patient Stated Goals Less pain, able to stand/walk longer, would like to be able to do exercises to improve my pain   Currently in Pain? Yes   Pain Score 7    Pain Location Back   Pain Orientation Right;Left;Lower   Pain Descriptors / Indicators Headache   Pain Type Chronic pain   Pain Onset More than a month ago   Pain Frequency Constant                         [facility] Adult PT Treatment/Exercise - 08/23/16 0001      Lumbar Exercises: Aerobic   Stationary Bike L5 x 15 min     Moist Heat Therapy   Number Minutes  Moist Heat 20 Minutes   Moist Heat Location Lumbar Spine     Electrical Stimulation   Electrical Stimulation Location B thoracic/lumbar paraspinals   Electrical Stimulation Action IFC   Electrical Stimulation Parameters 80-150hz    Electrical Stimulation Goals Pain     Manual Therapy   Manual Therapy Soft tissue mobilization   Soft tissue mobilization STW in sitting to B thoracic/lumbar paraspinals to decrease pain and tightness                     PT Long Term Goals - 08/28/16 0920      PT LONG TERM GOAL #1   Title I with advanced HEP   Time 6   Period Weeks   Status Achieved     PT LONG TERM GOAL #2   Title Decreased pain overall by 50% since evaluation with ADLS  NM due to pain is 40% less   Baseline  No change yet 02/07/16   Time 6   Period Weeks   Status Not Met     PT LONG TERM GOAL #3   Title Patient able to stand to do ADLs for 30 min before needing to sit.  NM due to pain   Time 6   Period Weeks   Status Not Met     PT LONG TERM GOAL #4   Title Patient able to walk for 30 min before needing stop for pain  NM due to pain   Baseline 15 min 02/07/16   Time 6   Period Weeks   Status Not Met     PT LONG TERM GOAL #5   Title Able to do up and down stairs to bedroom 2x/day rather than sleeping on the couch   Time 8   Period Weeks   Status Achieved               Plan - 08/28/16 0956    Clinical Impression Statement Pt did fair with Rx today and was able to meet 2 of 5 LTGs.  She was still very sensitive along thoracolumbar paras during STW and was only able to tolerate light pressure. Pt continues to be limited in a lot of  her functional  activities due to high pain levels in both feet. Her DC  FOTO today was higher for limitations due to LB and Bil foot pain. she feels her LB pain has improved 40%.    Rehab Potential Fair   PT Frequency 2x / week   PT Duration 6 weeks   PT Treatment/Interventions ADLs/Self Care Home Management;Electrical Stimulation;Moist Heat;Therapeutic exercise;Ultrasound;Neuromuscular re-education;Patient/family education;Manual techniques;Dry needling;Passive range of motion   PT Next Visit Plan DC to home and Gym program due to [facility]  insurance limits. Today was 30th visit this year   PT Home Exercise Plan lumbar stretches   Consulted and Agree with Plan of Care Patient      Patient will benefit from skilled therapeutic intervention in order to improve the following deficits and impairments:  Decreased range of motion, Pain, Impaired flexibility  Visit Diagnosis: Midline low back pain with sciatica, sciatica laterality unspecified  Pain in thoracic spine       G-Codes - 08/28/2016 1012    Functional Assessment Tool Used FOTO 62 % limited       Problem List Patient Active Problem List   Diagnosis Date Noted  . Chronic venous insufficiency 03/09/2016  . Hepatitis C 03/05/2016  . Varicose veins of lower extremities with complications 40/98/1191  .  Chronic hepatitis C virus infection ([place]) 12/15/2015  . Allergic rhinitis 12/09/2015  . Insomnia 12/09/2015  . Genital herpes 12/09/2015  . Hyperlipidemia 12/09/2015  . Menopausal symptom 12/09/2015  . Compression fracture of body of thoracic vertebra 02/17/2015  . Lumbar compression fracture, L1-2 endplate  70/23/0172  . Thoracic compression fracture, T12 endplate  09/03/6815  . Fracture of metatarsal bone of right foot, 4th neck 10/22/2014  . Has jumped from building 10/22/2014  . Bipolar 1 disorder ([place])   . Schizoaffective disorder ([place])   . Calcaneal fracture, Left  10/19/2014    Maiko Salais,CHRIS, PTA 08/23/2016, 10:41 AM  [facility] [place], [place], [address] Phone: [phone]   Fax:  [phone]  Name: Lauren Fuentes MRN: [id number] Date of Birth: [birth year]   PHYSICAL THERAPY DISCHARGE SUMMARY  Visits from Start of Care: 11  Current functional level related to goals / functional outcomes: Please see above.   Remaining deficits: Continued pain.   Education / Equipment: HEP.  Plan: Patient agrees to discharge.  Patient goals were partially met. Patient is being discharged due to financial reasons.  ?????  Mali Applegate MPT

## 2016-09-04 ENCOUNTER — Ambulatory Visit (INDEPENDENT_AMBULATORY_CARE_PROVIDER_SITE_OTHER): Payer: Medicare Other | Admitting: Family Medicine

## 2016-09-04 ENCOUNTER — Encounter: Payer: Self-pay | Admitting: Family Medicine

## 2016-09-04 VITALS — BP 101/62 | HR 74 | Temp 97.9°F | Ht 63.0 in | Wt 187.2 lb

## 2016-09-04 DIAGNOSIS — N898 Other specified noninflammatory disorders of vagina: Secondary | ICD-10-CM

## 2016-09-04 DIAGNOSIS — Z23 Encounter for immunization: Secondary | ICD-10-CM | POA: Diagnosis not present

## 2016-09-04 DIAGNOSIS — F319 Bipolar disorder, unspecified: Secondary | ICD-10-CM | POA: Diagnosis not present

## 2016-09-04 DIAGNOSIS — L309 Dermatitis, unspecified: Secondary | ICD-10-CM | POA: Diagnosis not present

## 2016-09-04 DIAGNOSIS — R5383 Other fatigue: Secondary | ICD-10-CM | POA: Diagnosis not present

## 2016-09-04 LAB — WET PREP FOR TRICH, YEAST, CLUE
Clue Cell Exam: NEGATIVE
TRICHOMONAS EXAM: NEGATIVE
Yeast Exam: NEGATIVE

## 2016-09-04 MED ORDER — FLUCONAZOLE 150 MG PO TABS: 150.0000 mg | ORAL_TABLET | Freq: Once | ORAL | 0 refills | Status: AC

## 2016-09-04 NOTE — Patient Instructions (Signed)
Great to to see you!  Lets see you again in 1 month to discuss memory loss

## 2016-09-04 NOTE — Progress Notes (Signed)
   HPI  Patient presents today here for follow-up and for a few acute problems.  Vaginal discharge After wearing a hard bathing suit, described as thick white discharge with mild irritation that's been going on for about one week. No sexual activity.  Bipolar disorder She still is getting regular treatment at day [facility], she would like to change. She has called behavioral health once and stated that they have not called her back.  Decreased energy States that her mood is up and down and sometimes her energy is up and down. At times she has lots of energy, at other times she has very low energy.  Nipple burning She describes bilateral intermittent nipple burning that happened on 3 or 4 occasions over the last week. She had one episode of inverted nipple slightly that happened about one week ago and has since resolved. No skin changes She is up-to-date on mammograms  PMH: Smoking status noted ROS: Per HPI  Objective: BP 101/62   Pulse 74   Temp 97.9 F (36.6 C) (Oral)   Ht 5\' 3"  (1.6 m)   Wt 187 lb 3.2 oz (84.9 kg)   BMI 33.16 kg/m  Gen: NAD, alert, cooperative with exam HEENT: NCAT CV: RRR, good S1/S2, no murmur Resp: CTABL, no wheezes, non-labored Ext: No edema, warm Neuro: Alert and oriented, No gross deficits  Assessment and plan:  # Bipolar disorder She appears to be euthymic today Recommended calling behavioral health again if she would like to transition care She states that she has some mild passive suicidal thoughts but absolutely no intentions and no homicidal intentions or thoughts today.  # Decreased energy I think it's most likely due to her waxing and waning mood due to bipolar disorder Reassurance provided  # Nipple burning/nipple dermatitis No exam today She is up-to-date on mammograms Recommended over-the-counter cream like hydrocortisone or Lanacane  # vaginal discharge Wet prep negative for yeast, trichomonas, and BV today Her history is  descriptive of yeast vaginosis Treating with Diflucan given convincing clinical story  "Oh by the way"- memory problems for months- Return in 1 month eval.     Orders Placed This Encounter  Procedures  . WET PREP FOR TRICH, YEAST, CLUE    No orders of the defined types were placed in this encounter.   Laroy Apple, MD [facility] 09/04/2016, 8:12 AM

## 2016-09-04 NOTE — Addendum Note (Signed)
Addended by: Karle Plumber on: 09/04/2016 08:45 AM   Modules accepted: Orders

## 2016-10-05 ENCOUNTER — Ambulatory Visit: Payer: Medicare Other | Admitting: Family Medicine

## 2016-10-08 ENCOUNTER — Ambulatory Visit: Payer: Medicare Other | Admitting: Family Medicine

## 2016-10-12 ENCOUNTER — Ambulatory Visit (INDEPENDENT_AMBULATORY_CARE_PROVIDER_SITE_OTHER): Payer: Medicare Other

## 2016-10-12 ENCOUNTER — Encounter: Payer: Self-pay | Admitting: Family Medicine

## 2016-10-12 ENCOUNTER — Other Ambulatory Visit: Payer: Self-pay | Admitting: Family Medicine

## 2016-10-12 ENCOUNTER — Ambulatory Visit (INDEPENDENT_AMBULATORY_CARE_PROVIDER_SITE_OTHER): Payer: Medicare Other | Admitting: Family Medicine

## 2016-10-12 VITALS — BP 112/69 | HR 75 | Temp 97.6°F | Ht 63.0 in | Wt 188.8 lb

## 2016-10-12 DIAGNOSIS — Z23 Encounter for immunization: Secondary | ICD-10-CM

## 2016-10-12 DIAGNOSIS — M542 Cervicalgia: Secondary | ICD-10-CM | POA: Diagnosis not present

## 2016-10-12 MED ORDER — GABAPENTIN 100 MG PO CAPS: 100.0000 mg | ORAL_CAPSULE | Freq: Three times a day (TID) | ORAL | 3 refills | Status: DC

## 2016-10-12 NOTE — Progress Notes (Signed)
   HPI  Patient presents today neck pain.  She has had long-standing neck pain, however over the last 1 month it has been progressing. About 5 weeks ago she began getting right hand radiation and numbness. It  Is now spreading to her BL arms, She describes BL arm numbness extending down the dorsal art of her arms and hands BL.   She describes burning tingling type pain from BL neck to shoulders, worse with movement.   PMH: Smoking status noted ROS: Per HPI  Objective: BP 112/69   Pulse 75   Temp 97.6 F (36.4 C) (Oral)   Ht 5\' 3"  (1.6 m)   Wt 188 lb 12.8 oz (85.6 kg)   BMI 33.44 kg/m  Gen: NAD, alert, cooperative with exam HEENT: NCAT CV: RRR, good S1/S2, no murmur Resp: CTABL, no wheezes, non-labored Ext: No edema, warm Neuro: Alert and oriented, strength 3/5 in BL UE, Sensation intact. Area of concern extends  Roughly in th eC7 distrubution BL MSK: Some pain with palpation of BL paraspinal muscles in the cervical area.    Assessment and plan:  # Neck pain With radiculopathy and objective weakness on exam, area of numbness is largely contained n the C 7 distrubution very concerning for buldging or ruptured disk Gabapentin started for neuropathic pain, has Hx of subsatnce abuse in remission (doing very well lately) Very low threshold for return if worsening Plain film today, MR orderred Refer to ortho with possible surgical pathology     Orders Placed This Encounter  Procedures  . MR Cervical Spine Wo Contrast    Standing Status:   Future    Standing Expiration Date:   12/12/2017    Order Specific Question:   Reason for Exam (SYMPTOM  OR DIAGNOSIS REQUIRED)    Answer:   BL neck pain with radiculopathy and arm weakness    Order Specific Question:   Preferred imaging location?    Answer:   [facility] (table limit-350lbs)    Order Specific Question:   What is the patient's sedation requirement?    Answer:   No Sedation    Order Specific Question:   Does the  patient have a pacemaker or implanted devices?    Answer:   No  . DG Cervical Spine 2 or 3 views    Standing Status:   Future    Standing Expiration Date:   10/12/2017    Order Specific Question:   Reason for Exam (SYMPTOM  OR DIAGNOSIS REQUIRED)    Answer:   preogressive neck pain    Order Specific Question:   Is the patient pregnant?    Answer:   No    Order Specific Question:   Preferred imaging location?    Answer:   External  . Ambulatory referral to Orthopedic Surgery    Referral Priority:   Routine    Referral Type:   Surgical    Referral Reason:   Specialty Services Required    Requested Specialty:   Orthopedic Surgery    Number of Visits Requested:   1    Meds ordered this encounter  Medications  . gabapentin (NEURONTIN) 100 MG capsule    Sig: Take 1 capsule (100 mg total) by mouth 3 (three) times daily.    Dispense:  90 capsule    Refill:  [place], MD [facility] 10/12/2016, 9:04 AM

## 2016-10-12 NOTE — Patient Instructions (Signed)
Lauren Fuentes tto see you!  Start gabapentin 1 pill three times daily. We will call with an orthopedic appointment

## 2016-10-15 ENCOUNTER — Ambulatory Visit: Payer: Medicare Other | Admitting: Family Medicine

## 2016-10-19 ENCOUNTER — Ambulatory Visit (HOSPITAL_COMMUNITY): Payer: Medicare Other

## 2016-10-22 ENCOUNTER — Encounter (HOSPITAL_COMMUNITY): Payer: Self-pay | Admitting: Radiology

## 2016-10-22 ENCOUNTER — Ambulatory Visit (HOSPITAL_COMMUNITY)
Admission: RE | Admit: 2016-10-22 | Discharge: 2016-10-22 | Disposition: A | Discharge status: Home / Self Care | Payer: Medicare Other | Source: Ambulatory Visit | Attending: Family Medicine | Admitting: Family Medicine

## 2016-10-22 DIAGNOSIS — M542 Cervicalgia: Secondary | ICD-10-CM | POA: Diagnosis not present

## 2016-11-02 DIAGNOSIS — S32020D Wedge compression fracture of second lumbar vertebra, subsequent encounter for fracture with routine healing: Secondary | ICD-10-CM | POA: Diagnosis not present

## 2016-11-06 DIAGNOSIS — F319 Bipolar disorder, unspecified: Secondary | ICD-10-CM | POA: Diagnosis not present

## 2016-12-21 ENCOUNTER — Telehealth (HOSPITAL_COMMUNITY): Payer: Self-pay | Admitting: *Deleted

## 2017-01-02 ENCOUNTER — Other Ambulatory Visit: Payer: Self-pay | Admitting: Family Medicine

## 2017-01-02 DIAGNOSIS — N951 Menopausal and female climacteric states: Secondary | ICD-10-CM

## 2017-01-08 DIAGNOSIS — N644 Mastodynia: Secondary | ICD-10-CM | POA: Diagnosis not present

## 2017-01-14 ENCOUNTER — Other Ambulatory Visit: Payer: Self-pay | Admitting: Emergency Medicine

## 2017-01-14 DIAGNOSIS — R5381 Other malaise: Secondary | ICD-10-CM

## 2017-02-04 ENCOUNTER — Other Ambulatory Visit: Payer: Self-pay | Admitting: *Deleted

## 2017-02-04 ENCOUNTER — Ambulatory Visit: Payer: Medicare Other | Admitting: Family Medicine

## 2017-02-05 ENCOUNTER — Ambulatory Visit: Payer: Medicare Other | Admitting: Family Medicine

## 2017-02-18 DIAGNOSIS — B182 Chronic viral hepatitis C: Secondary | ICD-10-CM | POA: Diagnosis not present

## 2017-02-20 DIAGNOSIS — F319 Bipolar disorder, unspecified: Secondary | ICD-10-CM | POA: Diagnosis not present

## 2017-02-20 DIAGNOSIS — N644 Mastodynia: Secondary | ICD-10-CM | POA: Diagnosis not present

## 2017-02-20 DIAGNOSIS — A6000 Herpesviral infection of urogenital system, unspecified: Secondary | ICD-10-CM | POA: Diagnosis not present

## 2017-02-20 DIAGNOSIS — J309 Allergic rhinitis, unspecified: Secondary | ICD-10-CM | POA: Diagnosis not present

## 2017-02-20 DIAGNOSIS — F2089 Other schizophrenia: Secondary | ICD-10-CM | POA: Diagnosis not present

## 2017-02-21 DIAGNOSIS — B349 Viral infection, unspecified: Secondary | ICD-10-CM | POA: Diagnosis not present

## 2017-02-21 DIAGNOSIS — Z23 Encounter for immunization: Secondary | ICD-10-CM | POA: Diagnosis not present

## 2017-02-21 DIAGNOSIS — Z79899 Other long term (current) drug therapy: Secondary | ICD-10-CM | POA: Diagnosis not present

## 2017-02-21 DIAGNOSIS — R05 Cough: Secondary | ICD-10-CM | POA: Diagnosis not present

## 2017-02-21 DIAGNOSIS — F319 Bipolar disorder, unspecified: Secondary | ICD-10-CM | POA: Diagnosis not present

## 2017-02-21 DIAGNOSIS — F209 Schizophrenia, unspecified: Secondary | ICD-10-CM | POA: Diagnosis not present

## 2017-02-26 DIAGNOSIS — F319 Bipolar disorder, unspecified: Secondary | ICD-10-CM | POA: Diagnosis not present

## 2017-02-27 DIAGNOSIS — R922 Inconclusive mammogram: Secondary | ICD-10-CM | POA: Diagnosis not present

## 2017-02-27 DIAGNOSIS — N644 Mastodynia: Secondary | ICD-10-CM | POA: Diagnosis not present

## 2017-05-01 ENCOUNTER — Other Ambulatory Visit: Payer: Self-pay | Admitting: Family

## 2017-05-01 DIAGNOSIS — J209 Acute bronchitis, unspecified: Secondary | ICD-10-CM

## 2017-05-01 DIAGNOSIS — J101 Influenza due to other identified influenza virus with other respiratory manifestations: Secondary | ICD-10-CM

## 2017-05-29 ENCOUNTER — Other Ambulatory Visit: Payer: Self-pay | Admitting: Family Medicine

## 2017-05-29 ENCOUNTER — Other Ambulatory Visit: Payer: Self-pay | Admitting: Family

## 2017-05-29 DIAGNOSIS — J209 Acute bronchitis, unspecified: Secondary | ICD-10-CM

## 2017-05-29 DIAGNOSIS — J101 Influenza due to other identified influenza virus with other respiratory manifestations: Secondary | ICD-10-CM

## 2017-05-30 ENCOUNTER — Other Ambulatory Visit: Payer: Self-pay | Admitting: Family

## 2017-05-30 ENCOUNTER — Other Ambulatory Visit: Payer: Self-pay | Admitting: Family Medicine

## 2017-05-30 DIAGNOSIS — J209 Acute bronchitis, unspecified: Secondary | ICD-10-CM

## 2017-05-30 DIAGNOSIS — J101 Influenza due to other identified influenza virus with other respiratory manifestations: Secondary | ICD-10-CM

## 2017-06-18 DIAGNOSIS — F319 Bipolar disorder, unspecified: Secondary | ICD-10-CM | POA: Diagnosis not present

## 2017-06-27 ENCOUNTER — Other Ambulatory Visit: Payer: Self-pay | Admitting: Family

## 2017-06-27 ENCOUNTER — Other Ambulatory Visit: Payer: Self-pay | Admitting: Family Medicine

## 2017-06-27 DIAGNOSIS — J209 Acute bronchitis, unspecified: Secondary | ICD-10-CM

## 2017-06-27 DIAGNOSIS — J101 Influenza due to other identified influenza virus with other respiratory manifestations: Secondary | ICD-10-CM

## 2017-07-26 ENCOUNTER — Other Ambulatory Visit: Payer: Self-pay | Admitting: Family Medicine

## 2017-08-27 ENCOUNTER — Other Ambulatory Visit: Payer: Self-pay | Admitting: Family Medicine

## 2017-08-28 ENCOUNTER — Other Ambulatory Visit: Payer: Self-pay | Admitting: Family Medicine

## 2017-09-02 ENCOUNTER — Ambulatory Visit: Admission: RE | Admit: 2017-09-02 | Discharge: 2017-09-02 | Payer: MEDICARE | Attending: Nurse Practitioner

## 2017-09-02 DIAGNOSIS — Z124 Encounter for screening for malignant neoplasm of cervix: Secondary | ICD-10-CM | POA: Diagnosis not present

## 2017-09-02 DIAGNOSIS — Z131 Encounter for screening for diabetes mellitus: Secondary | ICD-10-CM | POA: Diagnosis not present

## 2017-09-02 DIAGNOSIS — Z01411 Encounter for gynecological examination (general) (routine) with abnormal findings: Secondary | ICD-10-CM | POA: Diagnosis not present

## 2017-09-02 DIAGNOSIS — K649 Unspecified hemorrhoids: Secondary | ICD-10-CM | POA: Diagnosis not present

## 2017-09-02 DIAGNOSIS — Z23 Encounter for immunization: Secondary | ICD-10-CM | POA: Diagnosis not present

## 2017-09-02 DIAGNOSIS — B182 Chronic viral hepatitis C: Secondary | ICD-10-CM | POA: Diagnosis not present

## 2017-09-02 DIAGNOSIS — K5900 Constipation, unspecified: Secondary | ICD-10-CM | POA: Diagnosis not present

## 2017-09-02 DIAGNOSIS — Z13220 Encounter for screening for lipoid disorders: Secondary | ICD-10-CM | POA: Diagnosis not present

## 2017-09-02 DIAGNOSIS — A6004 Herpesviral vulvovaginitis: Secondary | ICD-10-CM | POA: Diagnosis not present

## 2017-09-02 DIAGNOSIS — K769 Liver disease, unspecified: Secondary | ICD-10-CM | POA: Diagnosis not present

## 2017-09-02 MED ORDER — VALACYCLOVIR 500 MG TABLET
ORAL_TABLET | Freq: Every day | ORAL | 5 refills | 0 days | Status: CP
Start: 2017-09-02 — End: 2017-10-02

## 2017-09-03 MED ORDER — FLUTICASONE PROPIONATE 50 MCG/ACTUATION NASAL SPRAY,SUSPENSION
Freq: Every day | NASAL | 5 refills | 0.00000 days | Status: CP
Start: 2017-09-03 — End: ?

## 2017-09-12 MED ORDER — ESTRADIOL 0.5 MG TABLET
ORAL_TABLET | Freq: Every day | ORAL | 2 refills | 0 days | Status: CP
Start: 2017-09-12 — End: 2017-10-22

## 2017-09-12 MED ORDER — MEDROXYPROGESTERONE 2.5 MG TABLET
ORAL_TABLET | Freq: Every day | ORAL | 0 refills | 0.00000 days | Status: CP
Start: 2017-09-12 — End: 2017-10-22

## 2017-09-24 ENCOUNTER — Other Ambulatory Visit: Payer: Self-pay | Admitting: Family Medicine

## 2017-10-22 ENCOUNTER — Ambulatory Visit: Admission: RE | Admit: 2017-10-22 | Discharge: 2017-10-22 | Payer: MEDICARE | Attending: Nurse Practitioner

## 2017-10-22 DIAGNOSIS — K5900 Constipation, unspecified: Secondary | ICD-10-CM | POA: Diagnosis not present

## 2017-10-22 DIAGNOSIS — F319 Bipolar disorder, unspecified: Secondary | ICD-10-CM | POA: Diagnosis not present

## 2017-10-22 DIAGNOSIS — A6004 Herpesviral vulvovaginitis: Secondary | ICD-10-CM | POA: Diagnosis not present

## 2017-10-22 DIAGNOSIS — N951 Menopausal and female climacteric states: Secondary | ICD-10-CM | POA: Diagnosis not present

## 2017-10-22 MED ORDER — VALACYCLOVIR 500 MG TABLET
ORAL_TABLET | Freq: Every day | ORAL | 5 refills | 0.00000 days | Status: CP
Start: 2017-10-22 — End: 2017-11-21

## 2017-10-22 MED ORDER — IBUPROFEN 800 MG TABLET
ORAL_TABLET | Freq: Three times a day (TID) | ORAL | 2 refills | 0 days | Status: CP | PRN
Start: 2017-10-22 — End: 2018-05-14

## 2017-10-22 MED ORDER — CONJUGATED ESTROGENS 0.45 MG-BAZEDOXIFENE 20 MG TABLET
ORAL_TABLET | Freq: Every day | ORAL | 3 refills | 0.00000 days | Status: CP
Start: 2017-10-22 — End: 2017-11-21

## 2017-10-23 ENCOUNTER — Ambulatory Visit
Admission: RE | Admit: 2017-10-23 | Discharge: 2017-10-23 | Disposition: A | Discharge status: Home / Self Care | Payer: MEDICARE | Attending: Surgery

## 2017-10-23 DIAGNOSIS — K5900 Constipation, unspecified: Secondary | ICD-10-CM | POA: Diagnosis not present

## 2017-10-23 DIAGNOSIS — K649 Unspecified hemorrhoids: Secondary | ICD-10-CM | POA: Diagnosis not present

## 2017-10-23 DIAGNOSIS — K644 Residual hemorrhoidal skin tags: Secondary | ICD-10-CM | POA: Diagnosis not present

## 2017-11-13 ENCOUNTER — Ambulatory Visit
Admission: RE | Admit: 2017-11-13 | Discharge: 2017-11-13 | Disposition: A | Discharge status: Home / Self Care | Attending: Gastroenterology

## 2017-11-13 DIAGNOSIS — K5900 Constipation, unspecified: Secondary | ICD-10-CM | POA: Diagnosis not present

## 2018-01-01 ENCOUNTER — Ambulatory Visit: Admit: 2018-01-01 | Discharge: 2018-01-02 | Payer: MEDICARE | Attending: Gastroenterology | Primary: Gastroenterology

## 2018-01-01 DIAGNOSIS — Z23 Encounter for immunization: Secondary | ICD-10-CM | POA: Diagnosis not present

## 2018-01-01 DIAGNOSIS — K5900 Constipation, unspecified: Secondary | ICD-10-CM | POA: Diagnosis not present

## 2018-01-01 DIAGNOSIS — F319 Bipolar disorder, unspecified: Secondary | ICD-10-CM | POA: Diagnosis not present

## 2018-01-14 DIAGNOSIS — R0683 Snoring: Secondary | ICD-10-CM | POA: Diagnosis not present

## 2018-01-14 DIAGNOSIS — R635 Abnormal weight gain: Secondary | ICD-10-CM | POA: Diagnosis not present

## 2018-01-14 DIAGNOSIS — M545 Low back pain: Secondary | ICD-10-CM | POA: Diagnosis not present

## 2018-01-14 DIAGNOSIS — M25511 Pain in right shoulder: Secondary | ICD-10-CM | POA: Diagnosis not present

## 2018-01-14 DIAGNOSIS — G8929 Other chronic pain: Secondary | ICD-10-CM | POA: Diagnosis not present

## 2018-01-14 DIAGNOSIS — J309 Allergic rhinitis, unspecified: Secondary | ICD-10-CM | POA: Diagnosis not present

## 2018-01-14 DIAGNOSIS — Z79899 Other long term (current) drug therapy: Secondary | ICD-10-CM | POA: Diagnosis not present

## 2018-01-14 DIAGNOSIS — K759 Inflammatory liver disease, unspecified: Secondary | ICD-10-CM | POA: Diagnosis not present

## 2018-01-14 DIAGNOSIS — M79671 Pain in right foot: Secondary | ICD-10-CM | POA: Diagnosis not present

## 2018-01-14 DIAGNOSIS — M25512 Pain in left shoulder: Secondary | ICD-10-CM | POA: Diagnosis not present

## 2018-01-21 ENCOUNTER — Other Ambulatory Visit: Payer: Self-pay

## 2018-01-21 ENCOUNTER — Encounter: Payer: Self-pay | Admitting: Physical Therapy

## 2018-01-21 ENCOUNTER — Ambulatory Visit: Payer: Medicare Other | Attending: Family | Admitting: Physical Therapy

## 2018-01-21 DIAGNOSIS — M542 Cervicalgia: Secondary | ICD-10-CM

## 2018-01-21 DIAGNOSIS — M6281 Muscle weakness (generalized): Secondary | ICD-10-CM | POA: Insufficient documentation

## 2018-01-21 DIAGNOSIS — M545 Low back pain, unspecified: Secondary | ICD-10-CM

## 2018-01-21 DIAGNOSIS — R293 Abnormal posture: Secondary | ICD-10-CM | POA: Diagnosis not present

## 2018-01-21 DIAGNOSIS — M546 Pain in thoracic spine: Secondary | ICD-10-CM | POA: Diagnosis not present

## 2018-01-21 DIAGNOSIS — G8929 Other chronic pain: Secondary | ICD-10-CM | POA: Diagnosis not present

## 2018-01-21 NOTE — Therapy (Signed)
[facility] [address], [place], [place], [address] Phone: [phone]   Fax:  [phone]  Physical Therapy Evaluation  Patient Details  Name: Danah Reinecke MRN: [id number] Date of Birth: 1966-03-09 Referring Provider: Etter Sjogren, NP   Encounter Date: 01/21/2018  PT End of Session - 01/21/18 1043    Visit Number  1    Number of Visits  16    Date for PT Re-Evaluation  03/18/18    Authorization Time Period  01/21/18 to 03/18/18    PT Start Time  0930    PT Stop Time  1017    PT Time Calculation (min)  47 min    Activity Tolerance  No increased pain;Patient tolerated treatment well    Behavior During Therapy  [facility] for tasks assessed/performed       Past Medical History:  Diagnosis Date  . Arthritis   . Back pain, chronic   . Bipolar 1 disorder ([place])   . Calcaneal fracture, Left  10/19/2014  . Fracture of metatarsal bone of right foot, 4th neck 10/22/2014  . GERD (gastroesophageal reflux disease)   . Headache    hx migraines, tension headaches  . Hepatitis    stated in the past - pt denies this as of 07/28/15)  . Lumbar compression fracture, L1-2 endplate  76/28/3151  . Post traumatic stress disorder (PTSD)   . Schizoaffective disorder ([place])   . Thoracic compression fracture, T12 endplate  76/16/0737    Past Surgical History:  Procedure Laterality Date  . COLONOSCOPY    . HARDWARE REMOVAL Left 07/29/2015   Procedure: HARDWARE REMOVAL,LEFT HEEL;  Surgeon: Altamese Andrews, MD;  Location: [place];  Service: Orthopedics;  Laterality: Left;  . KYPHOPLASTY N/A 02/17/2015   Procedure: T12 KYPHOPLASTY;  Surgeon: Melina Schools, MD;  Location: [place];  Service: Orthopedics;  Laterality: N/A;  . ORIF CALCANEOUS FRACTURE Left 10/21/2014   Procedure: OPEN REDUCTION INTERNAL FIXATION (ORIF) LEFT CALCANEOUS FRACTURE;  Surgeon: Rozanna Box, MD;  Location: [place];  Service: Orthopedics;  Laterality: Left;  . STERIOD INJECTION  Right 07/29/2015   Procedure: Subtalar Joint injection with Fluoro guidance ;  Surgeon: Altamese , MD;  Location: [place];  Service: Orthopedics;  Laterality: Right;  . TUBAL LIGATION      There were no vitals filed for this visit.   Subjective Assessment - 01/21/18 0940    Subjective  Pt reports having neck, shoulder and mid to low back pain ongoing for a couple of years, following a fracture of the spine in 2016. She had a kyphoplasty and reports that things are good other than the pain she continues to have. She is not sure what exactly makes it worse, but notes that she has trouble with lifting and using her stairs at home. She would like to improve this.     Pertinent History  lumbar/thoracic fracture 2015/2016, kyphoplasty 2016, schizoaffective disorder, PTSD    Patient Stated Goals  be able to go up/down the stairs easier    Currently in Pain?  No/denies    Pain Score  8  has been worse    Pain Orientation  Right;Left;Mid;Lower    Pain Descriptors / Indicators  Aching;Nagging    Pain Type  Chronic pain    Pain Radiating Towards  unsure     Pain Onset  More than a month ago    Pain Frequency  Constant    Aggravating Factors   all the time, not sure:  maybe picking up stuff    Pain Relieving Factors  heat, warm bath      Effect of Pain on Daily Activities  difficulty picking up things, carrying groceries          [facility] PT Assessment - 01/21/18 0001      Assessment   Medical Diagnosis  chronic shoulder/neck/thoracic/lumbar pain     Referring Provider  Etter Sjogren, NP    Onset Date/Surgical Date  -- 2016    Prior Therapy  2017 OPPT following surgery       Precautions   Precautions  None      Balance Screen   Has the patient fallen in the past 6 months  No    Has the patient had a decrease in activity level because of a fear of falling?   No    Is the patient reluctant to leave their home because of a fear of falling?   No      Prior Function   Level of Independence   Independent      Cognition   Overall Cognitive Status  Within Functional Limits for tasks assessed      Observation/Other Assessments   Observations  sitting slouched, forward head and rounded shoulders       ROM / Strength   AROM / PROM / Strength  AROM;Strength      AROM   AROM Assessment Site  Cervical;Lumbar    Cervical Flexion  WNL, pulling posterior neck    Cervical - Right Side Bend  45 deg, pulling opposite side of neck    Cervical - Left Side Bend  45 deg, pulling opposite side of neck    Cervical - Right Rotation  50 deg, pulling Lt side of neck    Cervical - Left Rotation  35 deg, discomfort Lt side of neck     Lumbar Flexion  pulling stretch low back    Lumbar Extension  discomfort low back at end range       Strength   Strength Assessment Site  Shoulder;Hip;Knee;Ankle    Right/Left Shoulder  -- shoulder grossly 5/5     Right Shoulder Flexion  0/5    Right/Left Hip  Right;Left    Right Hip Flexion  3+/5    Right Hip Extension  4/5    Right Hip ABduction  3/5    Left Hip Flexion  3+/5    Left Hip Extension  4/5    Left Hip ABduction  3/5    Right/Left Knee  Right;Left    Right Knee Extension  5/5    Left Knee Extension  5/5    Right/Left Ankle  Left;Right    Right Ankle Dorsiflexion  5/5    Left Ankle Dorsiflexion  5/5      Flexibility   Soft Tissue Assessment /Muscle Length  yes    Hamstrings  WNL    Quadriceps  WNL      Palpation   Palpation comment  tenderness along low back, thoracic spine, upper trap region       Special Tests   Other special tests  (+) thomas test Lt/Rt      Transfers   Five time sit to stand comments   18 sec      Ambulation/Gait   Pre-Gait Activities  ascend/descend steps with poor eccentric control x2 trials.       6 Minute Walk- Baseline   6 Minute Walk- Baseline  --      6  minute walk test results    Endurance additional comments  6 MWT: 1200 ft              Objective measurements completed on examination:  See above findings.              PT Education - 01/21/18 1120    Education provided  Yes    Education Details  eval findings/POC; importance of improving hip strength with decreasing pain during every day activity; importance of completing daily walking program    Person(s) Educated  Patient    Methods  Explanation    Comprehension  Verbalized understanding       PT Short Term Goals - 01/21/18 1012      PT SHORT TERM GOAL #1   Title  Pt will be independent with her initial HEP to improve strength and endurance.     Time  4    Period  Weeks    Status  New      PT SHORT TERM GOAL #2   Title  Pt will demo improved cervical AROM to atleast 50 deg Lt/Rt which will improve her ability to watch traffic while driving.     Time  4    Period  Weeks    Status  New      PT SHORT TERM GOAL #3   Title  Pt will demo proper log roll technique when getting in/out of bed, without cuing from the therapist, which will decrease strain to the low back throughout the day.     Time  4    Period  Weeks    Status  New        PT Long Term Goals - 01/21/18 1014      PT LONG TERM GOAL #1   Title  Pt will be independent with an advanced HEP which allow her to maintain improvements even after discharge from therapy.     Time  8    Period  Weeks    Status  New    Target Date  03/18/18      PT LONG TERM GOAL #2   Title  Pt will report atleast 50% improvement in her pain/strength/mobility from the start of PT in order to improve her quality of life.     Time  8    Period  Weeks    Status  New      PT LONG TERM GOAL #3   Title  Pt will report atleast 60% improvement in her ability to ascend and descend her stairs at home, to allow her to get up and down without as much difficulty.     Time  8    Period  Weeks    Status  New      PT LONG TERM GOAL #4   Title  Pt will report completing atleast 30 min of walking 4 days a week to allow her to maintain her regular fitness and endurance at  home.     Time  8    Period  Weeks    Status  New      PT LONG TERM GOAL #5   Title  Pt will demo improved BLE strength to atleast 4+/5 MMT which will improve her safety with daily activity.     Time  8    Period  Weeks      Additional Long Term Goals   Additional Long Term Goals  Yes      PT LONG  TERM GOAL #6   Title  Pt will complete 5x sit to stand in less than 13 sec to reflect improvements in her functional strength and power.     Time  8    Period  Weeks    Status  New             Plan - 01/21/18 1044    Clinical Impression Statement  Pt is a 52 y.o F referred to OPPT with complaints of chronic neck, thoracic and low back pain ongoing for atleast 2 years. Pt has a history of traumatic injury leading to thoracic compression fraction which was addressed with a kyphoplasty. She then had OPPT to address pain and other areas of limitation. She presents today with continued complaints of upper and lower back pain. She demonstrates weakness in the LEs, decreased lumbar and cervical active ROM, impaired endurance and overall deconditioning as a result of sedentary lifestyle. Recently, she has been trying to get her eating and weight under control, and would benefit from skilled PT to address her limitations in strength, ROM and decrease her pain with daily activity. This will allow her to safely and efficiently complete daily activity at home and in the community as well as transition into an advanced home program for maintained wellness at discharge.      History and Personal Factors relevant to plan of care:  schizoaffective disorder and PTSD; chronic pain; PT previously     Clinical Presentation  Evolving    Clinical Presentation due to:  worsening    Clinical Decision Making  Moderate    Rehab Potential  Good    PT Frequency  2x / week    PT Duration  8 weeks    PT Treatment/Interventions  ADLs/Self Care Home Management;Cryotherapy;Electrical Stimulation;Moist Heat;Iontophoresis  4mg /ml Dexamethasone;Therapeutic exercise;Balance training;Patient/family education;Therapeutic activities;Stair training;Neuromuscular re-education;Gait training;Manual techniques;Taping;Dry needling;Passive range of motion    PT Next Visit Plan  provide HEP: stretching cervical, thoracic and lumbar; hip strengthening (abduction/flexion), Nustep/bike for endurance.     PT Home Exercise Plan  walking 5-10 min every day     Consulted and Agree with Plan of Care  Patient       Patient will benefit from skilled therapeutic intervention in order to improve the following deficits and impairments:  Decreased activity tolerance, Decreased balance, Difficulty walking, Impaired flexibility, Obesity, Hypomobility, Decreased strength, Decreased endurance, Decreased range of motion, Pain, Improper body mechanics, Increased muscle spasms, Postural dysfunction, Decreased mobility, Abnormal gait  Visit Diagnosis: Chronic bilateral low back pain without sciatica - Plan: PT plan of care cert/re-cert  Pain in thoracic spine - Plan: PT plan of care cert/re-cert  Cervicalgia - Plan: PT plan of care cert/re-cert  Muscle weakness (generalized) - Plan: PT plan of care cert/re-cert  Abnormal posture - Plan: PT plan of care cert/re-cert     Problem List Patient Active Problem List   Diagnosis Date Noted  . Chronic venous insufficiency 03/09/2016  . Hepatitis C 03/05/2016  . Varicose veins of lower extremities with complications 10/93/2355  . Chronic hepatitis C virus infection ([place]) 12/15/2015  . Allergic rhinitis 12/09/2015  . Insomnia 12/09/2015  . Genital herpes 12/09/2015  . Hyperlipidemia 12/09/2015  . Menopausal symptom 12/09/2015  . Compression fracture of body of thoracic vertebra ([place]) 02/17/2015  . Lumbar compression fracture, L1-2 endplate  73/22/0254  . Thoracic compression fracture, T12 endplate  27/05/2375  . Fracture of metatarsal bone of right foot, 4th neck 10/22/2014  . Has jumped  from building  10/22/2014  . Bipolar 1 disorder ([place])   . Schizoaffective disorder ([place])   . Calcaneal fracture, Left  10/19/2014   1:48 PM,01/21/18 Sherol Dade PT, DPT [facility] [address] [place], [place], [address] Phone: [phone]   Fax:  [phone]  Name: Jadah Bobak MRN: [id number] Date of Birth: [birth year]

## 2018-01-26 DIAGNOSIS — F23 Brief psychotic disorder: Secondary | ICD-10-CM | POA: Diagnosis not present

## 2018-01-26 DIAGNOSIS — R079 Chest pain, unspecified: Secondary | ICD-10-CM | POA: Diagnosis not present

## 2018-01-26 DIAGNOSIS — F319 Bipolar disorder, unspecified: Secondary | ICD-10-CM | POA: Diagnosis not present

## 2018-01-26 DIAGNOSIS — Z711 Person with feared health complaint in whom no diagnosis is made: Secondary | ICD-10-CM | POA: Diagnosis not present

## 2018-01-26 DIAGNOSIS — Z79899 Other long term (current) drug therapy: Secondary | ICD-10-CM | POA: Diagnosis not present

## 2018-01-26 DIAGNOSIS — F2081 Schizophreniform disorder: Secondary | ICD-10-CM | POA: Diagnosis not present

## 2018-01-26 DIAGNOSIS — F209 Schizophrenia, unspecified: Secondary | ICD-10-CM | POA: Diagnosis not present

## 2018-01-27 ENCOUNTER — Ambulatory Visit: Payer: Medicare Other | Attending: Family | Admitting: Physical Therapy

## 2018-01-27 ENCOUNTER — Encounter: Payer: Self-pay | Admitting: Physical Therapy

## 2018-01-27 DIAGNOSIS — R293 Abnormal posture: Secondary | ICD-10-CM | POA: Diagnosis not present

## 2018-01-27 DIAGNOSIS — G8929 Other chronic pain: Secondary | ICD-10-CM | POA: Diagnosis not present

## 2018-01-27 DIAGNOSIS — M545 Low back pain, unspecified: Secondary | ICD-10-CM

## 2018-01-27 DIAGNOSIS — M6281 Muscle weakness (generalized): Secondary | ICD-10-CM | POA: Diagnosis not present

## 2018-01-27 DIAGNOSIS — M546 Pain in thoracic spine: Secondary | ICD-10-CM | POA: Diagnosis not present

## 2018-01-27 DIAGNOSIS — M542 Cervicalgia: Secondary | ICD-10-CM | POA: Diagnosis not present

## 2018-01-27 NOTE — Therapy (Signed)
[facility] [address], [place], [place], [address] Phone: [phone]   Fax:  [phone]  Physical Therapy Treatment  Patient Details  Name: Lauren Fuentes MRN: [id number] Date of Birth: June 15, 1966 Referring Provider: Etter Sjogren, NP   Encounter Date: 01/27/2018  PT End of Session - 01/27/18 1418    Visit Number  2    Number of Visits  16    Date for PT Re-Evaluation  03/18/18    Authorization Time Period  01/21/18 to 03/18/18    PT Start Time  2703    PT Stop Time  1440    PT Time Calculation (min)  41 min    Activity Tolerance  No increased pain;Patient tolerated treatment well    Behavior During Therapy  [facility] for tasks assessed/performed       Past Medical History:  Diagnosis Date  . Arthritis   . Back pain, chronic   . Bipolar 1 disorder ([place])   . Calcaneal fracture, Left  10/19/2014  . Fracture of metatarsal bone of right foot, 4th neck 10/22/2014  . GERD (gastroesophageal reflux disease)   . Headache    hx migraines, tension headaches  . Hepatitis    stated in the past - pt denies this as of 07/28/15)  . Lumbar compression fracture, L1-2 endplate  50/08/3817  . Post traumatic stress disorder (PTSD)   . Schizoaffective disorder ([place])   . Thoracic compression fracture, T12 endplate  29/93/7169    Past Surgical History:  Procedure Laterality Date  . COLONOSCOPY    . HARDWARE REMOVAL Left 07/29/2015   Procedure: HARDWARE REMOVAL,LEFT HEEL;  Surgeon: Altamese Worthing, MD;  Location: [place];  Service: Orthopedics;  Laterality: Left;  . KYPHOPLASTY N/A 02/17/2015   Procedure: T12 KYPHOPLASTY;  Surgeon: Melina Schools, MD;  Location: [place];  Service: Orthopedics;  Laterality: N/A;  . ORIF CALCANEOUS FRACTURE Left 10/21/2014   Procedure: OPEN REDUCTION INTERNAL FIXATION (ORIF) LEFT CALCANEOUS FRACTURE;  Surgeon: Rozanna Box, MD;  Location: [place];  Service: Orthopedics;  Laterality: Left;  . STERIOD INJECTION  Right 07/29/2015   Procedure: Subtalar Joint injection with Fluoro guidance ;  Surgeon: Altamese Wood-Ridge, MD;  Location: [place];  Service: Orthopedics;  Laterality: Right;  . TUBAL LIGATION      There were no vitals filed for this visit.  Subjective Assessment - 01/27/18 1402    Subjective  Pt reports that she has not been doing her walking at home. She is not sure why. She is in pain all over.     Pertinent History  lumbar/thoracic fracture 2015/2016, kyphoplasty 2016, schizoaffective disorder, PTSD    Patient Stated Goals  be able to go up/down the stairs easier    Currently in Pain?  No/denies    Pain Onset  More than a month ago                      [facility] Adult PT Treatment/Exercise - 01/27/18 0001      Exercises   Exercises  Lumbar      Lumbar Exercises: Stretches   Passive Hamstring Stretch  3 reps;Left;Right;30 seconds    Passive Hamstring Stretch Limitations  supine with strap     Single Knee to Chest Stretch  5 reps;10 seconds;Left;Right    Single Knee to Chest Stretch Limitations  supine     Piriformis Stretch  3 reps;Left;Right;30 seconds    Piriformis Stretch Limitations  supine  Other Lumbar Stretch Exercise  Lt/Rt upper trap 3x30 sec each       Lumbar Exercises: Aerobic   Nustep  L2 x6 min PT present to discuss session       Lumbar Exercises: Supine   Clam  10 reps;Limitations    Clam Limitations  x2 sets with green TB     Bridge  10 reps;Limitations    Bridge Limitations  x2 sets     Other Supine Lumbar Exercises  cervical rotation Lt/Rt x15 reps     Other Supine Lumbar Exercises  horizontal abduction red TB x20 reps; D2 flexion with red TB x20 reps each direction       Lumbar Exercises: Sidelying   Other Sidelying Lumbar Exercises  thoracic rotation x15 reps              PT Education - 01/27/18 1437    Education provided  Yes    Education Details  importance of establishing HEP to perform daily     Person(s) Educated  Patient     Methods  Explanation    Comprehension  Verbalized understanding       PT Short Term Goals - 01/21/18 1012      PT SHORT TERM GOAL #1   Title  Pt will be independent with her initial HEP to improve strength and endurance.     Time  4    Period  Weeks    Status  New      PT SHORT TERM GOAL #2   Title  Pt will demo improved cervical AROM to atleast 50 deg Lt/Rt which will improve her ability to watch traffic while driving.     Time  4    Period  Weeks    Status  New      PT SHORT TERM GOAL #3   Title  Pt will demo proper log roll technique when getting in/out of bed, without cuing from the therapist, which will decrease strain to the low back throughout the day.     Time  4    Period  Weeks    Status  New        PT Long Term Goals - 01/21/18 1014      PT LONG TERM GOAL #1   Title  Pt will be independent with an advanced HEP which allow her to maintain improvements even after discharge from therapy.     Time  8    Period  Weeks    Status  New    Target Date  03/18/18      PT LONG TERM GOAL #2   Title  Pt will report atleast 50% improvement in her pain/strength/mobility from the start of PT in order to improve her quality of life.     Time  8    Period  Weeks    Status  New      PT LONG TERM GOAL #3   Title  Pt will report atleast 60% improvement in her ability to ascend and descend her stairs at home, to allow her to get up and down without as much difficulty.     Time  8    Period  Weeks    Status  New      PT LONG TERM GOAL #4   Title  Pt will report completing atleast 30 min of walking 4 days a week to allow her to maintain her regular fitness and endurance at home.     Time  8    Period  Weeks    Status  New      PT LONG TERM GOAL #5   Title  Pt will demo improved BLE strength to atleast 4+/5 MMT which will improve her safety with daily activity.     Time  8    Period  Weeks      Additional Long Term Goals   Additional Long Term Goals  Yes      PT LONG  TERM GOAL #6   Title  Pt will complete 5x sit to stand in less than 13 sec to reflect improvements in her functional strength and power.     Time  8    Period  Weeks    Status  New            Plan - 01/27/18 1437    Clinical Impression Statement  Pt arrived today, reporting poor adherence to the walking program encouraged at her evaluation. Therapist discussed importance of HEP adherence and implemented/reviewed pt's initial program for her to complete. Pt demonstrated good understanding of exercises, requiring intermittent cues and reminders to slow her pace. Otherwise, ended session with improved cervical rotation to the Lt up to 50 deg. Will continue with current POC.      Rehab Potential  Good    PT Frequency  2x / week    PT Duration  8 weeks    PT Treatment/Interventions  ADLs/Self Care Home Management;Cryotherapy;Electrical Stimulation;Moist Heat;Iontophoresis 4mg /ml Dexamethasone;Therapeutic exercise;Balance training;Patient/family education;Therapeutic activities;Stair training;Neuromuscular re-education;Gait training;Manual techniques;Taping;Dry needling;Passive range of motion    PT Next Visit Plan  f/u on walking and HEP adherence; Nustep/bike for endurance; progress hip strengthening and cervical flexibility    PT Home Exercise Plan  walking 5-10 min every day     Consulted and Agree with Plan of Care  Patient       Patient will benefit from skilled therapeutic intervention in order to improve the following deficits and impairments:  Decreased activity tolerance, Decreased balance, Difficulty walking, Impaired flexibility, Obesity, Hypomobility, Decreased strength, Decreased endurance, Decreased range of motion, Pain, Improper body mechanics, Increased muscle spasms, Postural dysfunction, Decreased mobility, Abnormal gait  Visit Diagnosis: Chronic bilateral low back pain without sciatica  Pain in thoracic spine  Cervicalgia  Muscle weakness (generalized)  Abnormal  posture     Problem List Patient Active Problem List   Diagnosis Date Noted  . Chronic venous insufficiency 03/09/2016  . Hepatitis C 03/05/2016  . Varicose veins of lower extremities with complications 69/67/8938  . Chronic hepatitis C virus infection ([place]) 12/15/2015  . Allergic rhinitis 12/09/2015  . Insomnia 12/09/2015  . Genital herpes 12/09/2015  . Hyperlipidemia 12/09/2015  . Menopausal symptom 12/09/2015  . Compression fracture of body of thoracic vertebra ([place]) 02/17/2015  . Lumbar compression fracture, L1-2 endplate  10/09/5101  . Thoracic compression fracture, T12 endplate  58/52/7782  . Fracture of metatarsal bone of right foot, 4th neck 10/22/2014  . Has jumped from building 10/22/2014  . Bipolar 1 disorder ([place])   . Schizoaffective disorder ([place])   . Calcaneal fracture, Left  10/19/2014    3:27 PM,01/27/18 Sherol Dade PT, DPT [facility] [place] [address], [place], [place], [address] Phone: [phone]   Fax:  [phone]  Name: Lauren Fuentes MRN: [id number] Date of Birth: [birth year]

## 2018-01-27 NOTE — Patient Instructions (Signed)
   UPPER TRAP STRETCH - HAND BEHIND BACK  Place your arm behind your back. Next, tilt your head to the side. Hold for a stretch. Return to original position and then repeat.   Hold 30 sec, repeat 3x.      Cervical Rotation  In a supine position with head resting on a pillow, begin with head forward looking at the ceiling. Rotate the head to one side looking at the wall, then return to beginning position slowly.   Repeat to the other side. x15 times each side.       BRIDGING  While lying on your back with knees bent, tighten your lower abdominals, squeeze your buttocks and then raise your buttocks off the floor/bed as creating a "Bridge" with your body. Hold and then lower yourself and repeat. 2x10 reps.      SUPINE HIP ABDUCTION - ELASTIC BAND CLAMS - [place]  Lie down on your back with your knees bent. Place an elastic band around your knees and then draw your knees apart. 2x10 reps with red band around knees.     [facility] [address] [place], [place] [address] Phone # [phone] Fax [phone]

## 2018-01-28 ENCOUNTER — Ambulatory Visit: Admit: 2018-01-28 | Discharge: 2018-01-29 | Payer: MEDICARE

## 2018-01-28 DIAGNOSIS — B182 Chronic viral hepatitis C: Secondary | ICD-10-CM | POA: Diagnosis not present

## 2018-01-28 DIAGNOSIS — N951 Menopausal and female climacteric states: Secondary | ICD-10-CM | POA: Diagnosis not present

## 2018-01-29 ENCOUNTER — Encounter: Payer: Self-pay | Admitting: Physical Therapy

## 2018-01-29 ENCOUNTER — Ambulatory Visit: Payer: Medicare Other | Admitting: Physical Therapy

## 2018-01-29 DIAGNOSIS — M546 Pain in thoracic spine: Secondary | ICD-10-CM | POA: Diagnosis not present

## 2018-01-29 DIAGNOSIS — M542 Cervicalgia: Secondary | ICD-10-CM | POA: Diagnosis not present

## 2018-01-29 DIAGNOSIS — R293 Abnormal posture: Secondary | ICD-10-CM

## 2018-01-29 DIAGNOSIS — M6281 Muscle weakness (generalized): Secondary | ICD-10-CM | POA: Diagnosis not present

## 2018-01-29 DIAGNOSIS — G8929 Other chronic pain: Secondary | ICD-10-CM | POA: Diagnosis not present

## 2018-01-29 DIAGNOSIS — M545 Low back pain, unspecified: Secondary | ICD-10-CM

## 2018-01-29 NOTE — Therapy (Addendum)
[facility] [address] [place], [place], [address] Phone: [phone]   Fax:  [phone]  Physical Therapy Treatment/Discharge  Patient Details  Name: Lauren Fuentes MRN: [id number] Date of Birth: [birth year] Referring Provider: Etter Sjogren, NP   Encounter Date: 01/29/2018  PT End of Session - 01/29/18 1404    Visit Number  3    Number of Visits  16    Date for PT Re-Evaluation  03/18/18    Authorization Time Period  01/21/18 to 03/18/18    PT Start Time  0355    PT Stop Time  1439    PT Time Calculation (min)  40 min    Activity Tolerance  No increased pain;Patient tolerated treatment well    Behavior During Therapy  [facility] for tasks assessed/performed       Past Medical History:  Diagnosis Date  . Arthritis   . Back pain, chronic   . Bipolar 1 disorder ([place])   . Calcaneal fracture, Left  10/19/2014  . Fracture of metatarsal bone of right foot, 4th neck 10/22/2014  . GERD (gastroesophageal reflux disease)   . Headache    hx migraines, tension headaches  . Hepatitis    stated in the past - pt denies this as of 07/28/15)  . Lumbar compression fracture, L1-2 endplate  97/41/6384  . Post traumatic stress disorder (PTSD)   . Schizoaffective disorder ([place])   . Thoracic compression fracture, T12 endplate  53/64/6803    Past Surgical History:  Procedure Laterality Date  . COLONOSCOPY    . HARDWARE REMOVAL Left 07/29/2015   Procedure: HARDWARE REMOVAL,LEFT HEEL;  Surgeon: Altamese Easton, MD;  Location: [place];  Service: Orthopedics;  Laterality: Left;  . KYPHOPLASTY N/A 02/17/2015   Procedure: T12 KYPHOPLASTY;  Surgeon: Melina Schools, MD;  Location: [place];  Service: Orthopedics;  Laterality: N/A;  . ORIF CALCANEOUS FRACTURE Left 10/21/2014   Procedure: OPEN REDUCTION INTERNAL FIXATION (ORIF) LEFT CALCANEOUS FRACTURE;  Surgeon: Rozanna Box, MD;  Location: [place];  Service: Orthopedics;  Laterality: Left;  . STERIOD  INJECTION Right 07/29/2015   Procedure: Subtalar Joint injection with Fluoro guidance ;  Surgeon: Altamese [place], MD;  Location: [place];  Service: Orthopedics;  Laterality: Right;  . TUBAL LIGATION      There were no vitals filed for this visit.  Subjective Assessment - 01/29/18 1401    Subjective  Pt reports that things are going ok. She is wearing her compression socks today and says they were difficult to get on. She was able to try her exercises and doing them on the floor was tough.     Pertinent History  lumbar/thoracic fracture 2015/2016, kyphoplasty 2016, schizoaffective disorder, PTSD    Patient Stated Goals  be able to go up/down the stairs easier    Currently in Pain?  Other (Comment) none noted     Pain Onset  More than a month ago                      [facility] Adult PT Treatment/Exercise - 01/29/18 0001      Therapeutic Activites    Therapeutic Activities  Other Therapeutic Activities    Other Therapeutic Activities  sit to stand x5 and walking circuit (2x39f) x4 rounds with minimal rest break       Lumbar Exercises: Aerobic   Nustep  intervals L2 to L4 every one minute x8 min PT present to adjust intervals and discuss session  Lumbar Exercises: Standing   Heel Raises  20 reps;Limitations    Heel Raises Limitations  BLE    Other Standing Lumbar Exercises  hip extension resting on forearms 2x10 reps each       Lumbar Exercises: Supine   Straight Leg Raise  10 reps;Limitations    Straight Leg Raises Limitations  x2 sets each    Other Supine Lumbar Exercises  over foam roll alt LE marching with UE support x15 reps each     Other Supine Lumbar Exercises  horizontal abduction red TB x20 reps; D2 flexion with red TB x20 reps each direction       Lumbar Exercises: Sidelying   Other Sidelying Lumbar Exercises  thoracic rotation x15 reps each direction              PT Education - 01/29/18 1436    Education provided  Yes    Education Details  discussed  adjustments that can be made to HEP if having issues with this at home; encouraged pt to put compression socks on first thing in the morning.     Person(s) Educated  Patient    Methods  Explanation    Comprehension  Verbalized understanding       PT Short Term Goals - 01/29/18 1411      PT SHORT TERM GOAL #1   Title  Pt will be independent with her initial HEP to improve strength and endurance.     Time  4    Period  Weeks    Status  Achieved      PT SHORT TERM GOAL #2   Title  Pt will demo improved cervical AROM to atleast 50 deg Lt/Rt which will improve her ability to watch traffic while driving.     Time  4    Period  Weeks    Status  Achieved      PT SHORT TERM GOAL #3   Title  Pt will demo proper log roll technique when getting in/out of bed, without cuing from the therapist, which will decrease strain to the low back throughout the day.     Time  4    Period  Weeks    Status  On-going        PT Long Term Goals - 01/21/18 1014      PT LONG TERM GOAL #1   Title  Pt will be independent with an advanced HEP which allow her to maintain improvements even after discharge from therapy.     Time  8    Period  Weeks    Status  New    Target Date  03/18/18      PT LONG TERM GOAL #2   Title  Pt will report atleast 50% improvement in her pain/strength/mobility from the start of PT in order to improve her quality of life.     Time  8    Period  Weeks    Status  New      PT LONG TERM GOAL #3   Title  Pt will report atleast 60% improvement in her ability to ascend and descend her stairs at home, to allow her to get up and down without as much difficulty.     Time  8    Period  Weeks    Status  New      PT LONG TERM GOAL #4   Title  Pt will report completing atleast 30 min of walking 4 days a week to  allow her to maintain her regular fitness and endurance at home.     Time  8    Period  Weeks    Status  New      PT LONG TERM GOAL #5   Title  Pt will demo improved BLE  strength to atleast 4+/5 MMT which will improve her safety with daily activity.     Time  8    Period  Weeks      Additional Long Term Goals   Additional Long Term Goals  Yes      PT LONG TERM GOAL #6   Title  Pt will complete 5x sit to stand in less than 13 sec to reflect improvements in her functional strength and power.     Time  8    Period  Weeks    Status  New            Plan - 01/29/18 1424    Clinical Impression Statement  Pt arrived reporting consistent HEP adherence at home since her last session. She did experience increased fatigue and muscle soreness following last session which she understands is expected. Session continued with intervals on the Nustep along with therex progressions of the UE and LE. Pt was able to complete today's exercises without report of increase in neck, shoulder or low back pain. Also introduced intervals of sit to stand with walking laps to encourage increase in endurance and stamina. Will continue with current POC.     Rehab Potential  Good    PT Frequency  2x / week    PT Duration  8 weeks    PT Treatment/Interventions  ADLs/Self Care Home Management;Cryotherapy;Electrical Stimulation;Moist Heat;Iontophoresis 39m/ml Dexamethasone;Therapeutic exercise;Balance training;Patient/family education;Therapeutic activities;Stair training;Neuromuscular re-education;Gait training;Manual techniques;Taping;Dry needling;Passive range of motion    PT Next Visit Plan  f/u on walking and HEP adherence; Nustep/bike for endurance; progress hip strengthening and cervical flexibility    PT Home Exercise Plan  walking 5-10 min every day     Consulted and Agree with Plan of Care  Patient       Patient will benefit from skilled therapeutic intervention in order to improve the following deficits and impairments:  Decreased activity tolerance, Decreased balance, Difficulty walking, Impaired flexibility, Obesity, Hypomobility, Decreased strength, Decreased endurance,  Decreased range of motion, Pain, Improper body mechanics, Increased muscle spasms, Postural dysfunction, Decreased mobility, Abnormal gait  Visit Diagnosis: Chronic bilateral low back pain without sciatica  Pain in thoracic spine  Cervicalgia  Muscle weakness (generalized)  Abnormal posture     Problem List Patient Active Problem List   Diagnosis Date Noted  . Chronic venous insufficiency 03/09/2016  . Hepatitis C 03/05/2016  . Varicose veins of lower extremities with complications 097/58/8325 . Chronic hepatitis C virus infection ([place] 12/15/2015  . Allergic rhinitis 12/09/2015  . Insomnia 12/09/2015  . Genital herpes 12/09/2015  . Hyperlipidemia 12/09/2015  . Menopausal symptom 12/09/2015  . Compression fracture of body of thoracic vertebra ([place] 02/17/2015  . Lumbar compression fracture, L1-2 endplate  149/82/6415 . Thoracic compression fracture, T12 endplate  183/08/4075 . Fracture of metatarsal bone of right foot, 4th neck 10/22/2014  . Has jumped from building 10/22/2014  . [place] 1 disorder ([place]   . Schizoaffective disorder ([place]   . Calcaneal fracture, Left  10/19/2014    2:41 PM,01/29/18 SSherol DadePT, DPT [facility] [address]  [place], [place], [address] Phone: [phone]   Fax:  [phone]  Name: Lauren Fuentes MRN: [id number] Date of Birth: 24-May-1966   *Addendum to resolve episode of care and d/c pt from skilled PT  PHYSICAL THERAPY DISCHARGE SUMMARY  Visits from Start of Care: 3  Current functional level related to goals / functional outcomes: See above for more details    Remaining deficits: See above for more details    Education / Equipment: See above for more details   Plan: Patient agrees to discharge.  Patient goals were partially met. Patient is being discharged due to the patient's request.   ?????    Pt was overall making progress towards goals. Pt called requesting to be discharged from PT, but no reason was given.   9:07 AM,02/07/18 [place], [place] at [place]

## 2018-02-03 DIAGNOSIS — Z23 Encounter for immunization: Secondary | ICD-10-CM | POA: Diagnosis not present

## 2018-02-04 DIAGNOSIS — M722 Plantar fascial fibromatosis: Secondary | ICD-10-CM | POA: Diagnosis not present

## 2018-02-05 ENCOUNTER — Encounter: Payer: Self-pay | Admitting: Physical Therapy

## 2018-02-07 ENCOUNTER — Encounter: Payer: Self-pay | Admitting: Physical Therapy

## 2018-02-07 DIAGNOSIS — R509 Fever, unspecified: Secondary | ICD-10-CM | POA: Diagnosis not present

## 2018-02-07 DIAGNOSIS — J0110 Acute frontal sinusitis, unspecified: Secondary | ICD-10-CM | POA: Diagnosis not present

## 2018-02-07 DIAGNOSIS — F419 Anxiety disorder, unspecified: Secondary | ICD-10-CM | POA: Diagnosis not present

## 2018-02-11 DIAGNOSIS — F319 Bipolar disorder, unspecified: Secondary | ICD-10-CM | POA: Diagnosis not present

## 2018-03-03 ENCOUNTER — Ambulatory Visit: Admit: 2018-03-03 | Discharge: 2018-03-04 | Payer: MEDICARE | Attending: Gastroenterology | Primary: Gastroenterology

## 2018-03-03 DIAGNOSIS — K5900 Constipation, unspecified: Secondary | ICD-10-CM | POA: Diagnosis not present

## 2018-03-03 DIAGNOSIS — B1920 Unspecified viral hepatitis C without hepatic coma: Secondary | ICD-10-CM | POA: Diagnosis not present

## 2018-03-04 DIAGNOSIS — M79671 Pain in right foot: Secondary | ICD-10-CM | POA: Diagnosis not present

## 2018-03-04 DIAGNOSIS — M722 Plantar fascial fibromatosis: Secondary | ICD-10-CM | POA: Diagnosis not present

## 2018-03-04 DIAGNOSIS — M7661 Achilles tendinitis, right leg: Secondary | ICD-10-CM | POA: Diagnosis not present

## 2018-03-04 DIAGNOSIS — M79672 Pain in left foot: Secondary | ICD-10-CM | POA: Diagnosis not present

## 2018-03-04 DIAGNOSIS — M7662 Achilles tendinitis, left leg: Secondary | ICD-10-CM | POA: Diagnosis not present

## 2018-03-06 DIAGNOSIS — J40 Bronchitis, not specified as acute or chronic: Secondary | ICD-10-CM | POA: Diagnosis not present

## 2018-03-06 DIAGNOSIS — J309 Allergic rhinitis, unspecified: Secondary | ICD-10-CM | POA: Diagnosis not present

## 2018-03-06 DIAGNOSIS — Z23 Encounter for immunization: Secondary | ICD-10-CM | POA: Diagnosis not present

## 2018-03-07 DIAGNOSIS — R262 Difficulty in walking, not elsewhere classified: Secondary | ICD-10-CM | POA: Diagnosis not present

## 2018-03-07 DIAGNOSIS — M7661 Achilles tendinitis, right leg: Secondary | ICD-10-CM | POA: Diagnosis not present

## 2018-03-07 DIAGNOSIS — M7662 Achilles tendinitis, left leg: Secondary | ICD-10-CM | POA: Diagnosis not present

## 2018-03-07 DIAGNOSIS — M722 Plantar fascial fibromatosis: Secondary | ICD-10-CM | POA: Diagnosis not present

## 2018-03-12 DIAGNOSIS — M7662 Achilles tendinitis, left leg: Secondary | ICD-10-CM | POA: Diagnosis not present

## 2018-03-12 DIAGNOSIS — M7661 Achilles tendinitis, right leg: Secondary | ICD-10-CM | POA: Diagnosis not present

## 2018-03-12 DIAGNOSIS — R262 Difficulty in walking, not elsewhere classified: Secondary | ICD-10-CM | POA: Diagnosis not present

## 2018-03-12 DIAGNOSIS — M722 Plantar fascial fibromatosis: Secondary | ICD-10-CM | POA: Diagnosis not present

## 2018-03-17 DIAGNOSIS — M722 Plantar fascial fibromatosis: Secondary | ICD-10-CM | POA: Diagnosis not present

## 2018-03-17 DIAGNOSIS — R262 Difficulty in walking, not elsewhere classified: Secondary | ICD-10-CM | POA: Diagnosis not present

## 2018-03-17 DIAGNOSIS — M7661 Achilles tendinitis, right leg: Secondary | ICD-10-CM | POA: Diagnosis not present

## 2018-03-17 DIAGNOSIS — M7662 Achilles tendinitis, left leg: Secondary | ICD-10-CM | POA: Diagnosis not present

## 2018-03-19 DIAGNOSIS — R262 Difficulty in walking, not elsewhere classified: Secondary | ICD-10-CM | POA: Diagnosis not present

## 2018-03-19 DIAGNOSIS — M7661 Achilles tendinitis, right leg: Secondary | ICD-10-CM | POA: Diagnosis not present

## 2018-03-19 DIAGNOSIS — M722 Plantar fascial fibromatosis: Secondary | ICD-10-CM | POA: Diagnosis not present

## 2018-03-19 DIAGNOSIS — M7662 Achilles tendinitis, left leg: Secondary | ICD-10-CM | POA: Diagnosis not present

## 2018-03-20 DIAGNOSIS — M25512 Pain in left shoulder: Secondary | ICD-10-CM | POA: Diagnosis not present

## 2018-03-20 DIAGNOSIS — J40 Bronchitis, not specified as acute or chronic: Secondary | ICD-10-CM | POA: Diagnosis not present

## 2018-03-20 DIAGNOSIS — F419 Anxiety disorder, unspecified: Secondary | ICD-10-CM | POA: Diagnosis not present

## 2018-03-20 DIAGNOSIS — H6502 Acute serous otitis media, left ear: Secondary | ICD-10-CM | POA: Diagnosis not present

## 2018-03-20 DIAGNOSIS — M6289 Other specified disorders of muscle: Secondary | ICD-10-CM | POA: Diagnosis not present

## 2018-03-20 DIAGNOSIS — M25511 Pain in right shoulder: Secondary | ICD-10-CM | POA: Diagnosis not present

## 2018-03-20 DIAGNOSIS — R0789 Other chest pain: Secondary | ICD-10-CM | POA: Diagnosis not present

## 2018-03-20 DIAGNOSIS — K210 Gastro-esophageal reflux disease with esophagitis: Secondary | ICD-10-CM | POA: Diagnosis not present

## 2018-03-27 DIAGNOSIS — B182 Chronic viral hepatitis C: Secondary | ICD-10-CM | POA: Diagnosis not present

## 2018-03-31 DIAGNOSIS — M722 Plantar fascial fibromatosis: Secondary | ICD-10-CM | POA: Diagnosis not present

## 2018-03-31 DIAGNOSIS — M7660 Achilles tendinitis, unspecified leg: Secondary | ICD-10-CM | POA: Diagnosis not present

## 2018-03-31 DIAGNOSIS — R262 Difficulty in walking, not elsewhere classified: Secondary | ICD-10-CM | POA: Diagnosis not present

## 2018-04-01 DIAGNOSIS — F3189 Other bipolar disorder: Secondary | ICD-10-CM | POA: Diagnosis not present

## 2018-04-01 DIAGNOSIS — G4700 Insomnia, unspecified: Secondary | ICD-10-CM | POA: Diagnosis not present

## 2018-04-01 DIAGNOSIS — R5383 Other fatigue: Secondary | ICD-10-CM | POA: Diagnosis not present

## 2018-04-01 DIAGNOSIS — G4733 Obstructive sleep apnea (adult) (pediatric): Secondary | ICD-10-CM | POA: Diagnosis not present

## 2018-04-02 ENCOUNTER — Ambulatory Visit (HOSPITAL_COMMUNITY): Payer: Self-pay | Admitting: Physical Therapy

## 2018-04-02 DIAGNOSIS — M722 Plantar fascial fibromatosis: Secondary | ICD-10-CM | POA: Diagnosis not present

## 2018-04-02 DIAGNOSIS — R262 Difficulty in walking, not elsewhere classified: Secondary | ICD-10-CM | POA: Diagnosis not present

## 2018-04-02 DIAGNOSIS — M7660 Achilles tendinitis, unspecified leg: Secondary | ICD-10-CM | POA: Diagnosis not present

## 2018-04-03 ENCOUNTER — Ambulatory Visit (HOSPITAL_COMMUNITY): Payer: Medicare Other | Attending: Family | Admitting: Physical Therapy

## 2018-04-03 ENCOUNTER — Encounter (HOSPITAL_COMMUNITY): Payer: Self-pay | Admitting: Physical Therapy

## 2018-04-03 ENCOUNTER — Other Ambulatory Visit: Payer: Self-pay

## 2018-04-03 DIAGNOSIS — G8929 Other chronic pain: Secondary | ICD-10-CM | POA: Diagnosis not present

## 2018-04-03 DIAGNOSIS — M6281 Muscle weakness (generalized): Secondary | ICD-10-CM | POA: Diagnosis not present

## 2018-04-03 DIAGNOSIS — N3946 Mixed incontinence: Secondary | ICD-10-CM | POA: Insufficient documentation

## 2018-04-03 DIAGNOSIS — M545 Low back pain: Secondary | ICD-10-CM | POA: Insufficient documentation

## 2018-04-03 NOTE — Patient Instructions (Signed)
Kegals quick flicks and abdominal strengthening

## 2018-04-03 NOTE — Therapy (Signed)
[facility] Lauren Fuentes, [place], [address] Phone: [phone]   Fax:  [phone]  Physical Therapy Evaluation  Patient Details  Name: Lauren Fuentes MRN: [id number] Date of Birth: January 31, 1966 Referring Provider: Etter Sjogren    Encounter Date: 04/03/2018  PT End of Session - 04/03/18 1110    Visit Number  1    Number of Visits  6    Date for PT Re-Evaluation  05/15/18    Authorization Type  medicare    PT Start Time  0905    PT Stop Time  0950    PT Time Calculation (min)  45 min    Activity Tolerance  Patient tolerated treatment well    Behavior During Therapy  [facility] for tasks assessed/performed       Past Medical History:  Diagnosis Date  . Arthritis   . Back pain, chronic   . Bipolar 1 disorder ([place])   . Calcaneal fracture, Left  10/19/2014  . Fracture of metatarsal bone of right foot, 4th neck 10/22/2014  . GERD (gastroesophageal reflux disease)   . Headache    hx migraines, tension headaches  . Hepatitis    stated in the past - pt denies this as of 07/28/15)  . Lumbar compression fracture, L1-2 endplate  11/94/1740  . Post traumatic stress disorder (PTSD)   . Schizoaffective disorder ([place])   . Thoracic compression fracture, T12 endplate  81/44/8185    Past Surgical History:  Procedure Laterality Date  . COLONOSCOPY    . HARDWARE REMOVAL Left 07/29/2015   Procedure: HARDWARE REMOVAL,LEFT HEEL;  Surgeon: Altamese Treynor, MD;  Location: [place];  Service: Orthopedics;  Laterality: Left;  . KYPHOPLASTY N/A 02/17/2015   Procedure: T12 KYPHOPLASTY;  Surgeon: Melina Schools, MD;  Location: [place];  Service: Orthopedics;  Laterality: N/A;  . ORIF CALCANEOUS FRACTURE Left 10/21/2014   Procedure: OPEN REDUCTION INTERNAL FIXATION (ORIF) LEFT CALCANEOUS FRACTURE;  Surgeon: Rozanna Box, MD;  Location: [place];  Service: Orthopedics;  Laterality: Left;  . STERIOD INJECTION Right 07/29/2015   Procedure: Subtalar Joint injection with  Fluoro guidance ;  Surgeon: Altamese Blanket, MD;  Location: [place];  Service: Orthopedics;  Laterality: Right;  . TUBAL LIGATION      There were no vitals filed for this visit.   Subjective Assessment - 04/03/18 0913    Subjective  Ms. Bohl states that if she coughs, laughs or runs she begins to have incontinence issues for as long as she can remember.     Pertinent History  2 children with episotomies, back surgeries due to fx vertebraes, ankle fx currently seeing a therapist for her foot.    Currently in Pain?  No/denies    Pain Score  5  we are not seeing the pt for this she is going to see another therapist for this .     Pain Location  Ankle    Pain Orientation  Left;Right    Pain Descriptors / Indicators  Aching    Pain Type  Acute pain;Chronic pain    Pain Onset  More than a month ago    Pain Frequency  Constant    Aggravating Factors   actitivty     Pain Relieving Factors  ice     Effect of Pain on Daily Activities  limits    Multiple Pain Sites  Yes    Pain Score  7 we are not seeing this pateint for this she is seeing another  therapist.     Pain Location  Back    Pain Orientation  Lower    Pain Descriptors / Indicators  Aching    Pain Type  Chronic pain    Pain Onset  More than a month ago    Pain Frequency  Constant    Aggravating Factors   activity     Pain Relieving Factors  activity     Effect of Pain on Daily Activities  limits          [facility] PT Assessment - 04/03/18 0001      Assessment   Medical Diagnosis  stress and urge incontinence     Referring Provider  Etter Sjogren     Onset Date/Surgical Date  04/03/16    Next MD Visit  not scheduled     Prior Therapy  none for this condition       Precautions   Precautions  None      Restrictions   Weight Bearing Restrictions  No      Balance Screen   Has the patient fallen in the past 6 months  Yes    How many times?  5    Has the patient had a decrease in activity level because of a fear of falling?   Yes     Is the patient reluctant to leave their home because of a fear of falling?   No      Home Environment   Living Environment  Private residence      Cognition   Overall Cognitive Status  Within Functional Limits for tasks assessed      ROM / Strength   AROM / PROM / Strength  Strength      Strength   Strength Assessment Site  -- upper 3/5; lower 2+/5                 Objective measurements completed on examination: See above findings.    Pelvic Floor Special Questions - 04/03/18 0001    Prior Pelvic/Prostate Exam  Yes    Date of Last Pelvic/Prostate Exam  -- years ago     Prior Urinalysis  No    Are you Pregnant or attempting pregnancy?  No    Prior Pregnancies  Yes    Number of Pregnancies  -- 2    Number of Vaginal Deliveries  2    Any difficulty with labor and deliveries  -- breech with babies turned     Episiotomy Performed  Yes    Currently Sexually Active  No    Urinary Leakage  Yes    How often  multiple times a day     Pad use  three a day     Activities that cause leaking  With strong urge;Coughing;Sneezing;Laughing;Lifting;Bending;Walking    Urinary urgency  Yes    Urinary frequency  every 3 hrs.     Fecal incontinence  No constipation     Caffeine beverages  yes        [facility] Adult PT Treatment/Exercise - 04/03/18 0001      Exercises   Exercises  Lumbar      Lumbar Exercises: Standing   Other Standing Lumbar Exercises  Ab set at wall x 10      Lumbar Exercises: Supine   Ab Set  10 reps    Other Supine Lumbar Exercises  Kegal quick flicks; 2 second hold relax 4 seconds x 10       Lumbar Exercises: Quadruped  Other Quadruped Lumbar Exercises  ab set x 10              PT Education - 04/03/18 1102    Education provided  Yes    Education Details  HEP    Person(s) Educated  Patient    Methods  Explanation;Handout    Comprehension  Verbalized understanding;Returned demonstration       PT Short Term Goals - 04/03/18 1125      PT  SHORT TERM GOAL #1   Title  Pt to only be using 2 pads a day     Time  3    Period  Weeks    Status  New    Target Date  04/24/18      PT SHORT TERM GOAL #2   Title  PT to have noticed a 20% improvement in contience.    Time  3    Period  Weeks    Status  New      PT SHORT TERM GOAL #3   Title  Pt to be completing her HEP at least 3x a day to be able to achieve goals     Time  3        PT Long Term Goals - 04/03/18 1134      PT LONG TERM GOAL #1   Title  PT to be wearing one pad a day.    Time  6    Period  Weeks    Status  New    Target Date  05/15/18      PT LONG TERM GOAL #2   Title  PT to be having 2 or less accidents a day.     Time  6    Period  Weeks    Status  New      PT LONG TERM GOAL #3   Title  PT to be I in HEP to achieve total continence in the future.     Time  6    Period  Weeks    Status  New             Plan - 04/03/18 1110    Clinical Impression Statement  Ms. Prisco is a 52 yo female who has been referred to skilled physical therapy for low back pain, incontinence, and B shoulder pain.  She states that she is being seen at another physical therapy facility for B ankle pain as well .  When the therapist suggest that she she be seen at the other clinic for all her needs she stated that she did not want to; reason  unknown.  Initally when she called to make the referral the patient stated that she wanted to concentrate on her pelvic issues but at the evaluation the patient stated that she was now interested in dry needling for her back stating that she had this treatment in the past.  Therapist explaine that this would be a seperate evaluation.  Pelvic evaluation demonstrates weakened upper abdominal and signicantly weakened lower abdominal and pelvic floor mm causing incontinence.  Ms. Digilio will be seen for 6 treatments to progress her through a strengthening program for her abdominal and pelvic floor mm although complete continence is not expected  for 3-6 months therefore pt will be expected to continue her exercise program on her own after discharge.     History and Personal Factors relevant to plan of care:  Low back pain, ankle and shoulder pan B. Upper back pain  Clinical Presentation  Stable    Clinical Decision Making  Low    Rehab Potential  Good    PT Frequency  1x / week    PT Duration  6 weeks    PT Treatment/Interventions  Patient/family education;Therapeutic activities;Therapeutic exercise    PT Next Visit Plan  ensure pt is completing her HEP; add bent knee raises, heel slides and kegal long hold contraction to HEP     PT Home Exercise Plan  quick flick kegals; ab sets standig, supine and all fours        Patient will benefit from skilled therapeutic intervention in order to improve the following deficits and impairments:  Decreased coordination, Increased muscle spasms, Impaired perceived functional ability, Impaired tone, Decreased strength, Pain  Visit Diagnosis: Mixed incontinence     Problem List Patient Active Problem List   Diagnosis Date Noted  . Chronic venous insufficiency 03/09/2016  . Hepatitis C 03/05/2016  . Varicose veins of lower extremities with complications 16/09/9603  . Chronic hepatitis C virus infection ([place]) 12/15/2015  . Allergic rhinitis 12/09/2015  . Insomnia 12/09/2015  . Genital herpes 12/09/2015  . Hyperlipidemia 12/09/2015  . Menopausal symptom 12/09/2015  . Compression fracture of body of thoracic vertebra ([place]) 02/17/2015  . Lumbar compression fracture, L1-2 endplate  54/08/8118  . Thoracic compression fracture, T12 endplate  14/78/2956  . Fracture of metatarsal bone of right foot, 4th neck 10/22/2014  . Has jumped from building 10/22/2014  . Bipolar 1 disorder ([place])   . Schizoaffective disorder ([place])   . Calcaneal fracture, Left  10/19/2014    Rayetta Humphrey, PT CLT [phone] 04/03/2018, 11:38 AM  [place], [place], [address] Phone: [phone]   Fax:  [phone]  Name: Aniylah Avans MRN: [id number] Date of Birth: Mar 21, 1966

## 2018-04-09 DIAGNOSIS — M7660 Achilles tendinitis, unspecified leg: Secondary | ICD-10-CM | POA: Diagnosis not present

## 2018-04-09 DIAGNOSIS — M722 Plantar fascial fibromatosis: Secondary | ICD-10-CM | POA: Diagnosis not present

## 2018-04-09 DIAGNOSIS — R262 Difficulty in walking, not elsewhere classified: Secondary | ICD-10-CM | POA: Diagnosis not present

## 2018-04-10 ENCOUNTER — Telehealth (HOSPITAL_COMMUNITY): Payer: Self-pay | Admitting: *Deleted

## 2018-04-10 ENCOUNTER — Encounter (HOSPITAL_COMMUNITY): Payer: Self-pay | Admitting: Physical Therapy

## 2018-04-10 ENCOUNTER — Ambulatory Visit (HOSPITAL_COMMUNITY): Payer: Medicare Other | Admitting: Physical Therapy

## 2018-04-10 DIAGNOSIS — G8929 Other chronic pain: Secondary | ICD-10-CM | POA: Diagnosis not present

## 2018-04-10 DIAGNOSIS — M545 Low back pain, unspecified: Secondary | ICD-10-CM

## 2018-04-10 DIAGNOSIS — M6281 Muscle weakness (generalized): Secondary | ICD-10-CM | POA: Diagnosis not present

## 2018-04-10 DIAGNOSIS — N3946 Mixed incontinence: Secondary | ICD-10-CM | POA: Diagnosis not present

## 2018-04-10 NOTE — Telephone Encounter (Signed)
04/10/18  trying to find out about cost of the dry needling - doesn't want to exceed the $1900 with Medicare

## 2018-04-10 NOTE — Therapy (Signed)
[facility] [address] [place], [place], [address] Phone: [phone]   Fax:  [phone]  Physical Therapy Treatment  Patient Details  Name: Lauren Fuentes MRN: [id number] Date of Birth: 1966-01-09 Referring Provider: Etter Sjogren    Encounter Date: 04/10/2018  PT End of Session - 04/10/18 0856    Visit Number  2    Number of Visits  6    Date for PT Re-Evaluation  05/15/18    Authorization Type  medicare    Authorization - Visit Number  2    Authorization - Number of Visits  6    PT Start Time  0817    PT Stop Time  0856    PT Time Calculation (min)  39 min    Activity Tolerance  Patient tolerated treatment well    Behavior During Therapy  [facility] for tasks assessed/performed       Past Medical History:  Diagnosis Date  . Arthritis   . Back pain, chronic   . Bipolar 1 disorder ([place])   . Calcaneal fracture, Left  10/19/2014  . Fracture of metatarsal bone of right foot, 4th neck 10/22/2014  . GERD (gastroesophageal reflux disease)   . Headache    hx migraines, tension headaches  . Hepatitis    stated in the past - pt denies this as of 07/28/15)  . Lumbar compression fracture, L1-2 endplate  93/90/3009  . Post traumatic stress disorder (PTSD)   . Schizoaffective disorder ([place])   . Thoracic compression fracture, T12 endplate  23/30/0762    Past Surgical History:  Procedure Laterality Date  . COLONOSCOPY    . HARDWARE REMOVAL Left 07/29/2015   Procedure: HARDWARE REMOVAL,LEFT HEEL;  Surgeon: Altamese [place], MD;  Location: [facility];  Service: Orthopedics;  Laterality: Left;  . KYPHOPLASTY N/A 02/17/2015   Procedure: T12 KYPHOPLASTY;  Surgeon: Melina Schools, MD;  Location: [place];  Service: Orthopedics;  Laterality: N/A;  . ORIF CALCANEOUS FRACTURE Left 10/21/2014   Procedure: OPEN REDUCTION INTERNAL FIXATION (ORIF) LEFT CALCANEOUS FRACTURE;  Surgeon: Rozanna Box, MD;  Location: [facility];  Service: Orthopedics;  Laterality: Left;  .  STERIOD INJECTION Right 07/29/2015   Procedure: Subtalar Joint injection with Fluoro guidance ;  Surgeon: Altamese , MD;  Location: [place];  Service: Orthopedics;  Laterality: Right;  . TUBAL LIGATION      There were no vitals filed for this visit.  Subjective Assessment - 04/10/18 0816    Subjective  Ms. Igo states that she is not doing her exercieses on a regular basis     Pertinent History  2 children with episotomies, back surgeries due to fx vertebraes, ankle fx currently seeing a therapist for her foot.    Currently in Pain?  Yes for low back and ankles and we are not seeing her for this .  She is being seen at a different clinic for ankles and has an evaluation scheduled for her low back pain     Pain Score  5     Pain Location  Abdomen    Pain Orientation  Lower    Pain Descriptors / Indicators  Aching;Discomfort    Pain Onset  More than a month ago    Aggravating Factors   exercises     Pain Relieving Factors  heat     Pain Onset  More than a month ago  [facility] Adult PT Treatment/Exercise - 04/10/18 0001      Exercises   Exercises  Lumbar      Lumbar Exercises: Standing   Other Standing Lumbar Exercises  Ab set at wall x 10      Lumbar Exercises: Supine   Ab Set  10 reps    Heel Slides  10 reps    Bent Knee Raise  10 reps    Other Supine Lumbar Exercises  Kegal quick flicks; 2 second hold relax 4 seconds x 10     Other Supine Lumbar Exercises  Kegal long hold 5seconds relax 10 seconds  x 10       Lumbar Exercises: Quadruped   Other Quadruped Lumbar Exercises  ab set x 10              PT Education - 04/10/18 0855    Education provided  Yes    Education Details  HEP    Person(s) Educated  Patient    Methods  Explanation    Comprehension  Verbalized understanding       PT Short Term Goals - 04/03/18 1125      PT SHORT TERM GOAL #1   Title  Pt to only be using 2 pads a day     Time  3    Period  Weeks    Status   New    Target Date  04/24/18      PT SHORT TERM GOAL #2   Title  PT to have noticed a 20% improvement in contience.    Time  3    Period  Weeks    Status  New      PT SHORT TERM GOAL #3   Title  Pt to be completing her HEP at least 3x a day to be able to achieve goals     Time  3        PT Long Term Goals - 04/03/18 1134      PT LONG TERM GOAL #1   Title  PT to be wearing one pad a day.    Time  6    Period  Weeks    Status  New    Target Date  05/15/18      PT LONG TERM GOAL #2   Title  PT to be having 2 or less accidents a day.     Time  6    Period  Weeks    Status  New      PT LONG TERM GOAL #3   Title  PT to be I in HEP to achieve total continence in the future.     Time  6    Period  Weeks    Status  New            Plan - 04/10/18 8242    Clinical Impression Statement  Reviewed evaluation and goals with patient.  Pt has difficulty with exercises without verbal and tactile cuing.  Added long hold kegals, bent knee lifts and leg slides to program.      Rehab Potential  Good    PT Frequency  1x / week    PT Duration  6 weeks    PT Treatment/Interventions  Patient/family education;Therapeutic activities;Therapeutic exercise    PT Next Visit Plan  ensure pt is completing her HEP; Add sidelying abduction and prone SLR     PT Home Exercise Plan  quick flick kegals; ab sets standig, supine and all fours  ;  today added long hold kegal, bent leg lift and leg slides       Patient will benefit from skilled therapeutic intervention in order to improve the following deficits and impairments:  Decreased coordination, Increased muscle spasms, Impaired perceived functional ability, Impaired tone, Decreased strength, Pain  Visit Diagnosis: Mixed incontinence  Muscle weakness (generalized)  Chronic bilateral low back pain without sciatica     Problem List Patient Active Problem List   Diagnosis Date Noted  . Chronic venous insufficiency 03/09/2016  .  Hepatitis C 03/05/2016  . Varicose veins of lower extremities with complications 51/09/2110  . Chronic hepatitis C virus infection ([place]) 12/15/2015  . Allergic rhinitis 12/09/2015  . Insomnia 12/09/2015  . Genital herpes 12/09/2015  . Hyperlipidemia 12/09/2015  . Menopausal symptom 12/09/2015  . Compression fracture of body of thoracic vertebra ([place]) 02/17/2015  . Lumbar compression fracture, L1-2 endplate  73/56/7014  . Thoracic compression fracture, T12 endplate  10/22/1313  . Fracture of metatarsal bone of right foot, 4th neck 10/22/2014  . Has jumped from building 10/22/2014  . Bipolar 1 disorder ([place])   . Schizoaffective disorder ([place])   . Calcaneal fracture, Left  10/19/2014    Rayetta Humphrey, PT CLT [phone] 04/10/2018, 9:04 AM  [facility] [place], [place], [address] Phone: [phone]   Fax:  [phone]  Name: Soul Deveney MRN: [id number] Date of Birth: 1966-10-04

## 2018-04-10 NOTE — Patient Instructions (Addendum)
Bent Leg Lift (Hook-Lying)    Tighten stomach and slowly raise right leg ___2_ inches from floor. Keep trunk rigid. Hold __3__ seconds. Repeat _10__ times per set. Do _1___ sets per session. Do _3___ sessions per day.  http://orth.exer.us/1090   Copyright  VHI. All rights reserved.  Heel Walk (Hook-Lying)    Tighten stomach and slowly slide your right fot forward in short steps until legs are nearly straight, or until back begins to arch. Repeat __10_ times per set. Do __1__ sets per session. Do ___3_ sessions per day.  http://orth.exer.us/1094   Copyright  VHI. All rights reserved.

## 2018-04-15 DIAGNOSIS — F209 Schizophrenia, unspecified: Secondary | ICD-10-CM | POA: Diagnosis not present

## 2018-04-15 DIAGNOSIS — Z87891 Personal history of nicotine dependence: Secondary | ICD-10-CM | POA: Diagnosis not present

## 2018-04-15 DIAGNOSIS — Z79899 Other long term (current) drug therapy: Secondary | ICD-10-CM | POA: Diagnosis not present

## 2018-04-15 DIAGNOSIS — F319 Bipolar disorder, unspecified: Secondary | ICD-10-CM | POA: Diagnosis not present

## 2018-04-15 DIAGNOSIS — M25572 Pain in left ankle and joints of left foot: Secondary | ICD-10-CM | POA: Diagnosis not present

## 2018-04-15 DIAGNOSIS — M545 Low back pain: Secondary | ICD-10-CM | POA: Diagnosis not present

## 2018-04-15 DIAGNOSIS — Z7951 Long term (current) use of inhaled steroids: Secondary | ICD-10-CM | POA: Diagnosis not present

## 2018-04-15 DIAGNOSIS — S62617A Displaced fracture of proximal phalanx of left little finger, initial encounter for closed fracture: Secondary | ICD-10-CM | POA: Diagnosis not present

## 2018-04-15 DIAGNOSIS — M542 Cervicalgia: Secondary | ICD-10-CM | POA: Diagnosis not present

## 2018-04-15 DIAGNOSIS — S6992XA Unspecified injury of left wrist, hand and finger(s), initial encounter: Secondary | ICD-10-CM | POA: Diagnosis not present

## 2018-04-15 DIAGNOSIS — R51 Headache: Secondary | ICD-10-CM | POA: Diagnosis not present

## 2018-04-15 DIAGNOSIS — T148XXA Other injury of unspecified body region, initial encounter: Secondary | ICD-10-CM | POA: Diagnosis not present

## 2018-04-17 ENCOUNTER — Ambulatory Visit (HOSPITAL_COMMUNITY): Payer: Medicare Other

## 2018-04-17 ENCOUNTER — Encounter (HOSPITAL_COMMUNITY): Payer: Self-pay | Admitting: Physical Therapy

## 2018-04-17 ENCOUNTER — Ambulatory Visit (HOSPITAL_COMMUNITY): Payer: Medicare Other | Admitting: Physical Therapy

## 2018-04-17 DIAGNOSIS — N3946 Mixed incontinence: Secondary | ICD-10-CM | POA: Diagnosis not present

## 2018-04-17 DIAGNOSIS — G8929 Other chronic pain: Secondary | ICD-10-CM | POA: Diagnosis not present

## 2018-04-17 DIAGNOSIS — M545 Low back pain: Secondary | ICD-10-CM | POA: Diagnosis not present

## 2018-04-17 DIAGNOSIS — M6281 Muscle weakness (generalized): Secondary | ICD-10-CM | POA: Diagnosis not present
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
## 2018-04-17 NOTE — Patient Instructions (Addendum)
Strengthening: Hip Abduction (Side-Lying)    Tighten muscles in your lower abdominal, then lift leg __12__ inches from surface, keeping knee locked.  Repeat __10__ times per set. Do __1__ sets per session. Do _4___ sessions per day.  http://orth.exer.us/622   Copyright  VHI. All rights reserved.  Straight Leg Raise (Prone)    Abdomen and head supported, keep left knee locked and raise leg at hip.Tighten lower abdominal area Avoid arching low back. Repeat _10___ times per set. Do _1___ sets per session. Do __4__ sessions per day.  http://orth.exer.us/1112   Copyright  VHI. All rights reserved.

## 2018-04-18 DIAGNOSIS — M7660 Achilles tendinitis, unspecified leg: Secondary | ICD-10-CM | POA: Diagnosis not present

## 2018-04-18 DIAGNOSIS — R262 Difficulty in walking, not elsewhere classified: Secondary | ICD-10-CM | POA: Diagnosis not present

## 2018-04-18 DIAGNOSIS — M722 Plantar fascial fibromatosis: Secondary | ICD-10-CM | POA: Diagnosis not present

## 2018-04-22 ENCOUNTER — Ambulatory Visit (HOSPITAL_COMMUNITY): Payer: Medicare Other | Admitting: Physical Therapy

## 2018-04-22 DIAGNOSIS — M545 Low back pain: Secondary | ICD-10-CM | POA: Diagnosis not present

## 2018-04-22 DIAGNOSIS — N3946 Mixed incontinence: Secondary | ICD-10-CM | POA: Diagnosis not present

## 2018-04-22 DIAGNOSIS — M6281 Muscle weakness (generalized): Secondary | ICD-10-CM | POA: Diagnosis not present

## 2018-04-22 DIAGNOSIS — G8929 Other chronic pain: Secondary | ICD-10-CM | POA: Diagnosis not present

## 2018-04-22 NOTE — Patient Instructions (Addendum)
Opposite Arm / Leg Lift (Prone)    Abdomen and head supported, left knee locked, raise leg and opposite arm ___3_ inches from floor. Repeat _10___ times per set. Do ___1_ sets per session. Do _2___ sessions per day.  http://orth.exer.us/1114   Copyright  VHI. All rights reserved.  Upper / Lower Extremity Extension (All-Fours)    Tighten stomach and raise right leg and opposite arm. Keep trunk rigid. Repeat ___10_ times per set. Do __1__ sets per session. Do _2___ sessions per day.  http://orth.exer.us/1118   Copyright  VHI. All rights reserved.  Lower Abdominals Knee Down / Up    Start with both legs up. Lower one leg _5-10__ times. Restabilize pelvis. Repeat with other leg. Do 1___ sets, __2_ times per day.  http://ss.exer.us/28   Copyright  VHI. All rights reserved.

## 2018-04-22 NOTE — Therapy (Signed)
[facility] [address] [place], [place], [address] Phone: [phone]   Fax:  [phone]  Physical Therapy Treatment  Patient Details  Name: Lauren Fuentes MRN: [id number] Date of Birth: 03/14/1966 Referring Provider: Etter Sjogren    Encounter Date: 04/22/2018  PT End of Session - 04/22/18 0939    Visit Number  4    Number of Visits  6    Date for PT Re-Evaluation  05/15/18    Authorization Type  medicare    Authorization - Visit Number  4    Authorization - Number of Visits  6    PT Start Time  0905    PT Stop Time  0945    PT Time Calculation (min)  40 min    Activity Tolerance  Patient tolerated treatment well    Behavior During Therapy  [facility] for tasks assessed/performed       Past Medical History:  Diagnosis Date  . Arthritis   . Back pain, chronic   . Bipolar 1 disorder ([place])   . Calcaneal fracture, Left  10/19/2014  . Fracture of metatarsal bone of right foot, 4th neck 10/22/2014  . GERD (gastroesophageal reflux disease)   . Headache    hx migraines, tension headaches  . Hepatitis    stated in the past - pt denies this as of 07/28/15)  . Lumbar compression fracture, L1-2 endplate  71/69/6789  . Post traumatic stress disorder (PTSD)   . Schizoaffective disorder ([place])   . Thoracic compression fracture, T12 endplate  38/09/1750    Past Surgical History:  Procedure Laterality Date  . COLONOSCOPY    . HARDWARE REMOVAL Left 07/29/2015   Procedure: HARDWARE REMOVAL,LEFT HEEL;  Surgeon: Altamese [place], MD;  Location: [facility];  Service: Orthopedics;  Laterality: Left;  . KYPHOPLASTY N/A 02/17/2015   Procedure: T12 KYPHOPLASTY;  Surgeon: Melina Schools, MD;  Location: [facility];  Service: Orthopedics;  Laterality: N/A;  . ORIF CALCANEOUS FRACTURE Left 10/21/2014   Procedure: OPEN REDUCTION INTERNAL FIXATION (ORIF) LEFT CALCANEOUS FRACTURE;  Surgeon: Rozanna Box, MD;  Location: [place];  Service: Orthopedics;  Laterality: Left;  .  STERIOD INJECTION Right 07/29/2015   Procedure: Subtalar Joint injection with Fluoro guidance ;  Surgeon: Altamese [place], MD;  Location: [place];  Service: Orthopedics;  Laterality: Right;  . TUBAL LIGATION      There were no vitals filed for this visit.  Subjective Assessment - 04/22/18 0908    Subjective  PT states that she can tell a difference she can cough without wetting herself.      Pertinent History  2 children with episotomies, back surgeries due to fx vertebraes, ankle fx currently seeing a therapist for her foot.    Currently in Pain?  Yes    Pain Score  8     Pain Location  Hand    Pain Orientation  Left    Pain Descriptors / Indicators  Aching    Pain Onset  In the past 7 days    Aggravating Factors   activitiy    Pain Relieving Factors  medication     Effect of Pain on Daily Activities  limits    Pain Onset  More than a month ago                       [facility] Adult PT Treatment/Exercise - 04/22/18 0001      Exercises   Exercises  Lumbar  Lumbar Exercises: Standing   Other Standing Lumbar Exercises  T band Flexion x 10 B       Lumbar Exercises: Supine   Dead Bug  -- toe tap x 5    Other Supine Lumbar Exercises  Kegal quick flicks x 2 minutes   Kegal 10" hold 20" rest x 2 minutes      Lumbar Exercises: Sidelying   Hip Abduction  15 reps      Lumbar Exercises: Prone   Opposite Arm/Leg Raise  Right arm/Left leg;Left arm/Right leg;10 reps      Lumbar Exercises: Quadruped   Opposite Arm/Leg Raise  Right arm/Left leg;Left arm/Right leg;5 reps               PT Short Term Goals - 04/03/18 1125      PT SHORT TERM GOAL #1   Title  Pt to only be using 2 pads a day     Time  3    Period  Weeks    Status  New    Target Date  04/24/18      PT SHORT TERM GOAL #2   Title  PT to have noticed a 20% improvement in contience.    Time  3    Period  Weeks    Status  New      PT SHORT TERM GOAL #3   Title  Pt to be completing her HEP at least  3x a day to be able to achieve goals     Time  3        PT Long Term Goals - 04/03/18 1134      PT LONG TERM GOAL #1   Title  PT to be wearing one pad a day.    Time  6    Period  Weeks    Status  New    Target Date  05/15/18      PT LONG TERM GOAL #2   Title  PT to be having 2 or less accidents a day.     Time  6    Period  Weeks    Status  New      PT LONG TERM GOAL #3   Title  PT to be I in HEP to achieve total continence in the future.     Time  6    Period  Weeks    Status  New            Plan - 04/22/18 0939    Clinical Impression Statement  Pt improving in incontinence.  Added tap, prone opposite arm/leg raise ,( attempt quadriped but pt was to unstable), and Tband shoulder flexion to program.     Rehab Potential  Good    PT Frequency  1x / week    PT Duration  6 weeks    PT Treatment/Interventions  Patient/family education;Therapeutic activities;Therapeutic exercise    PT Next Visit Plan  begin shoulder ab/adduction with tband to program.(Putting and reverse putting.     PT Home Exercise Plan  quick flick kegals; ab sets standig, supine and all fours        Patient will benefit from skilled therapeutic intervention in order to improve the following deficits and impairments:  Decreased coordination, Increased muscle spasms, Impaired perceived functional ability, Impaired tone, Decreased strength, Pain  Visit Diagnosis: Mixed incontinence  Muscle weakness (generalized)     Problem List Patient Active Problem List   Diagnosis Date Noted  . Chronic venous insufficiency  03/09/2016  . Hepatitis C 03/05/2016  . Varicose veins of lower extremities with complications 15/18/3437  . Chronic hepatitis C virus infection ([place]) 12/15/2015  . Allergic rhinitis 12/09/2015  . Insomnia 12/09/2015  . Genital herpes 12/09/2015  . Hyperlipidemia 12/09/2015  . Menopausal symptom 12/09/2015  . Compression fracture of body of thoracic vertebra ([place]) 02/17/2015  .  Lumbar compression fracture, L1-2 endplate  35/78/9784  . Thoracic compression fracture, T12 endplate  78/41/2820  . Fracture of metatarsal bone of right foot, 4th neck 10/22/2014  . Has jumped from building 10/22/2014  . Bipolar 1 disorder ([place])   . Schizoaffective disorder ([place])   . Calcaneal fracture, Left  10/19/2014   Rayetta Humphrey, PT CLT [phone] 04/22/2018, 9:43 AM  [facility] [place], [place], [address] Phone: [phone]   Fax:  [phone]  Name: Lauren Fuentes MRN: [id number] Date of Birth: 12-Nov-1966

## 2018-04-23 DIAGNOSIS — F319 Bipolar disorder, unspecified: Secondary | ICD-10-CM | POA: Diagnosis not present

## 2018-04-23 DIAGNOSIS — R262 Difficulty in walking, not elsewhere classified: Secondary | ICD-10-CM | POA: Diagnosis not present

## 2018-04-23 DIAGNOSIS — M722 Plantar fascial fibromatosis: Secondary | ICD-10-CM | POA: Diagnosis not present

## 2018-04-23 DIAGNOSIS — M7660 Achilles tendinitis, unspecified leg: Secondary | ICD-10-CM | POA: Diagnosis not present

## 2018-04-23 DIAGNOSIS — F209 Schizophrenia, unspecified: Secondary | ICD-10-CM | POA: Diagnosis not present

## 2018-04-24 ENCOUNTER — Encounter (HOSPITAL_COMMUNITY): Payer: Self-pay | Admitting: Physical Therapy

## 2018-04-24 DIAGNOSIS — S62619A Displaced fracture of proximal phalanx of unspecified finger, initial encounter for closed fracture: Secondary | ICD-10-CM | POA: Diagnosis not present

## 2018-04-25 DIAGNOSIS — F209 Schizophrenia, unspecified: Secondary | ICD-10-CM | POA: Diagnosis not present

## 2018-04-25 DIAGNOSIS — M722 Plantar fascial fibromatosis: Secondary | ICD-10-CM | POA: Diagnosis not present

## 2018-04-25 DIAGNOSIS — R262 Difficulty in walking, not elsewhere classified: Secondary | ICD-10-CM | POA: Diagnosis not present

## 2018-04-25 DIAGNOSIS — M7660 Achilles tendinitis, unspecified leg: Secondary | ICD-10-CM | POA: Diagnosis not present

## 2018-04-25 DIAGNOSIS — F319 Bipolar disorder, unspecified: Secondary | ICD-10-CM | POA: Diagnosis not present

## 2018-05-01 ENCOUNTER — Ambulatory Visit (HOSPITAL_COMMUNITY): Payer: Medicare Other | Admitting: Physical Therapy

## 2018-05-01 DIAGNOSIS — S62617A Displaced fracture of proximal phalanx of left little finger, initial encounter for closed fracture: Secondary | ICD-10-CM | POA: Diagnosis not present

## 2018-05-02 DIAGNOSIS — F209 Schizophrenia, unspecified: Secondary | ICD-10-CM | POA: Diagnosis not present

## 2018-05-02 DIAGNOSIS — M722 Plantar fascial fibromatosis: Secondary | ICD-10-CM | POA: Diagnosis not present

## 2018-05-02 DIAGNOSIS — F319 Bipolar disorder, unspecified: Secondary | ICD-10-CM | POA: Diagnosis not present

## 2018-05-02 DIAGNOSIS — R262 Difficulty in walking, not elsewhere classified: Secondary | ICD-10-CM | POA: Diagnosis not present

## 2018-05-02 DIAGNOSIS — M7660 Achilles tendinitis, unspecified leg: Secondary | ICD-10-CM | POA: Diagnosis not present

## 2018-05-06 DIAGNOSIS — L03032 Cellulitis of left toe: Secondary | ICD-10-CM | POA: Diagnosis not present

## 2018-05-06 DIAGNOSIS — L03031 Cellulitis of right toe: Secondary | ICD-10-CM | POA: Diagnosis not present

## 2018-05-06 DIAGNOSIS — M79675 Pain in left toe(s): Secondary | ICD-10-CM | POA: Diagnosis not present

## 2018-05-06 DIAGNOSIS — M79674 Pain in right toe(s): Secondary | ICD-10-CM | POA: Diagnosis not present

## 2018-05-07 DIAGNOSIS — H52223 Regular astigmatism, bilateral: Secondary | ICD-10-CM | POA: Diagnosis not present

## 2018-05-07 DIAGNOSIS — H524 Presbyopia: Secondary | ICD-10-CM | POA: Diagnosis not present

## 2018-05-07 DIAGNOSIS — H5213 Myopia, bilateral: Secondary | ICD-10-CM | POA: Diagnosis not present

## 2018-05-08 ENCOUNTER — Ambulatory Visit (HOSPITAL_COMMUNITY): Payer: Medicare Other | Attending: Family | Admitting: Physical Therapy

## 2018-05-08 ENCOUNTER — Telehealth (HOSPITAL_COMMUNITY): Payer: Self-pay | Admitting: Physical Therapy

## 2018-05-08 DIAGNOSIS — Y999 Unspecified external cause status: Secondary | ICD-10-CM | POA: Diagnosis not present

## 2018-05-08 DIAGNOSIS — X58XXXA Exposure to other specified factors, initial encounter: Secondary | ICD-10-CM | POA: Diagnosis not present

## 2018-05-08 DIAGNOSIS — S62617A Displaced fracture of proximal phalanx of left little finger, initial encounter for closed fracture: Secondary | ICD-10-CM | POA: Diagnosis not present

## 2018-05-08 NOTE — Telephone Encounter (Signed)
Attempted to contact pt about missed appointment.  PT did not answer phone.  Rayetta Humphrey, [place] CLT [phone]

## 2018-05-14 ENCOUNTER — Ambulatory Visit: Admit: 2018-05-14 | Discharge: 2018-05-15 | Payer: MEDICARE | Attending: Gastroenterology | Primary: Gastroenterology

## 2018-05-14 DIAGNOSIS — E669 Obesity, unspecified: Secondary | ICD-10-CM | POA: Diagnosis not present

## 2018-05-14 DIAGNOSIS — Z1159 Encounter for screening for other viral diseases: Secondary | ICD-10-CM | POA: Diagnosis not present

## 2018-05-14 DIAGNOSIS — Z7951 Long term (current) use of inhaled steroids: Secondary | ICD-10-CM | POA: Diagnosis not present

## 2018-05-14 DIAGNOSIS — Z87891 Personal history of nicotine dependence: Secondary | ICD-10-CM | POA: Diagnosis not present

## 2018-05-14 DIAGNOSIS — J45909 Unspecified asthma, uncomplicated: Secondary | ICD-10-CM | POA: Diagnosis not present

## 2018-05-14 DIAGNOSIS — B182 Chronic viral hepatitis C: Secondary | ICD-10-CM | POA: Diagnosis not present

## 2018-05-14 DIAGNOSIS — Z79891 Long term (current) use of opiate analgesic: Secondary | ICD-10-CM | POA: Diagnosis not present

## 2018-05-14 DIAGNOSIS — F209 Schizophrenia, unspecified: Secondary | ICD-10-CM | POA: Diagnosis not present

## 2018-05-14 DIAGNOSIS — Z79899 Other long term (current) drug therapy: Secondary | ICD-10-CM | POA: Diagnosis not present

## 2018-05-14 DIAGNOSIS — F319 Bipolar disorder, unspecified: Secondary | ICD-10-CM | POA: Diagnosis not present

## 2018-05-14 DIAGNOSIS — Z79818 Long term (current) use of other agents affecting estrogen receptors and estrogen levels: Secondary | ICD-10-CM | POA: Diagnosis not present

## 2018-05-14 DIAGNOSIS — K739 Chronic hepatitis, unspecified: Secondary | ICD-10-CM

## 2018-05-14 DIAGNOSIS — B1920 Unspecified viral hepatitis C without hepatic coma: Principal | ICD-10-CM

## 2018-05-14 LAB — MEAN PLATELET VOLUME: Lab: 8.7

## 2018-05-14 LAB — COMPREHENSIVE METABOLIC PANEL
ALBUMIN: 4.3 g/dL (ref 3.5–5.0)
ALKALINE PHOSPHATASE: 78 U/L (ref 38–126)
ALT (SGPT): 33 U/L (ref 15–48)
ANION GAP: 8 mmol/L — ABNORMAL LOW (ref 9–15)
AST (SGOT): 34 U/L (ref 14–38)
BILIRUBIN TOTAL: 0.5 mg/dL (ref 0.0–1.2)
BLOOD UREA NITROGEN: 14 mg/dL (ref 7–21)
BUN / CREAT RATIO: 13
CALCIUM: 10 mg/dL (ref 8.5–10.2)
CHLORIDE: 103 mmol/L (ref 98–107)
CO2: 28 mmol/L (ref 22.0–30.0)
CREATININE: 1.06 mg/dL — ABNORMAL HIGH (ref 0.60–1.00)
EGFR MDRD AF AMER: >=60 mL/min/{1.73_m2} (ref >=60)
GLUCOSE RANDOM: 94 mg/dL (ref 65–179)
POTASSIUM: 4.6 mmol/L (ref 3.5–5.0)
PROTEIN TOTAL: 7.4 g/dL (ref 6.5–8.3)
SODIUM: 139 mmol/L (ref 135–145)

## 2018-05-14 LAB — FERRITIN: Ferritin:MCnc:Pt:Ser/Plas:Qn:: 53.1

## 2018-05-14 LAB — IRON & TIBC: IRON SATURATION (CALC): 35 % (ref 15–50)

## 2018-05-14 LAB — CBC
HEMATOCRIT: 43.4 % (ref 36.0–46.0)
HEMOGLOBIN: 14 g/dL (ref 12.0–16.0)
MEAN CORPUSCULAR HEMOGLOBIN CONC: 32.2 g/dL (ref 31.0–37.0)
MEAN CORPUSCULAR HEMOGLOBIN: 29.5 pg (ref 26.0–34.0)
MEAN CORPUSCULAR VOLUME: 91.4 fL (ref 80.0–100.0)
MEAN PLATELET VOLUME: 8.7 fL (ref 7.0–10.0)
PLATELET COUNT: 337 10*9/L (ref 150–440)
RED BLOOD CELL COUNT: 4.74 10*12/L (ref 4.00–5.20)

## 2018-05-14 LAB — AFP-TUMOR MARKER: Alpha-1-Fetoprotein.tumor marker:MCnc:Pt:Ser/Plas:Qn:: 3.36

## 2018-05-14 LAB — PROTEIN TOTAL: Protein:MCnc:Pt:Ser/Plas:Qn:: 7.4

## 2018-05-14 LAB — IRON SATURATION (CALC): Iron saturation:MFr:Pt:Ser/Plas:Qn:: 35

## 2018-05-14 NOTE — Unmapped (Addendum)
Chronic hepatitis C, You will be a good candidate for treatment with medications that have  High rate of cure and a low risk of side effects. Labs and FIBROSCAN today. Liver ultrasound scheduled for a different day. You will meet our pharmacist Erskine Squibb to discuss starting therapy. Congratulations on stopping drugs and alcohol which will help keep your liver healthy. Also, need to lose weight of about 20 lbs which will also help maintain liver health. Return to office in 3 months or sooner once hepatitis C medication has been started to see Owens Shark, DNP, once hepatitis C medication has been started. Test your children for hepatitis C if we cannot adequately determine when you got hepatitis C.

## 2018-05-14 NOTE — Unmapped (Signed)
Counseling for HCV treatment     B18.2 Hep C: yes    K74.60 Cirrhosis: no,   Child Pugh Score if applicable and for Medicaid pts: n/a  Z94.4 Liver Transplant: no    Genotype: 1a (03/27/18)  HCV RNA:  9,713 IU/ml on 03/27/18   Fibrosis score: F0 (4.2 kPa) on Fibroscan on 05/14/18  HIV Co-infection? no  Signs of liver decompensation? no  Previous treatment? naive    Planned regimen: Harvoni (ledipasvir/sofosbuvir 90/400mg ) x 8-12 weeks  Urgency: Routine Request    Prescribing Provider/NPI: Dr. Gavin Potters / [id number]  Please see Media tab under Chart Review on date 05/14/18 for patient signature/waiver forms.  Insurance: Part D, pt unsure    Amanda Moore is 52 y.o. Caucasian female who presents to clinic alone and is interested in starting treatment with Harvoni. We discussed the prior authorization (PA) process of obtaining the medication through insurance and that this may take some time.      Current medications:  Current Outpatient Medications   Medication Sig Dispense Refill   ??? budesonide-formoterol (SYMBICORT) 80-4.5 mcg/actuation inhaler Symbicort 80 mcg-4.5 mcg/actuation HFA aerosol inhaler   INHALE TWO PUFFS INTO THE LUNGS TWICE DAILY     ??? busPIRone (BUSPAR) 10 MG tablet Take 10 mg by mouth Two (2) times a day.     ??? desloratadine (CLARINEX) 5 mg tablet Take 5 mg by mouth daily.  1   ??? estradiol (ESTRACE) 0.5 MG tablet estradiol 0.5 mg tablet   TAKE 1 TABLET BY MOUTH DAILY     ??? FLUoxetine (PROZAC) 20 MG capsule Take 20 mg by mouth daily.     ??? fluticasone (FLONASE) 50 mcg/actuation nasal spray 2 sprays by Each Nare route daily. 16 g 5   ??? fluticasone (FLOVENT DISKUS) 100 mcg/actuation DsDv diskus Inhale 1 puff Two (2) times a day.     ??? gabapentin (NEURONTIN) 100 MG capsule gabapentin 100 mg capsule   TAKE ONE CAPSULE BY MOUTH THREE TIMES DAILY     ??? lamoTRIgine (LAMICTAL) 100 MG tablet Take 100 mg by mouth Two (2) times a day.      ??? loxapine (LOXITANE) 10 MG capsule Take 10 mg by mouth Two (2) times a day. ??? mupirocin (BACTROBAN) 2 % ointment APPLY TO THE AFFECTED AREA(S) ON EACH BIG TOE TWICE DAILY  0   ??? oxyCODONE-acetaminophen (PERCOCET) 10-325 mg per tablet TAKE 1 TABLET BY MOUTH EVERY 4 TO 6 HOURS AS NEEDED FOR PAIN  0   ??? polyethylene glycol (MIRALAX) 17 gram packet Take 17 g by mouth three (3) times a day (at 6am, noon and 6pm).     ??? traZODone (DESYREL) 100 MG tablet Take 150 mg by mouth nightly.      ??? triprolidine-pseudoephedrine (APRODINE) 2.5-60 mg Tab Take 1 tablet by mouth Every four (4) hours.     ??? valACYclovir (VALTREX) 500 MG tablet valacyclovir 500 mg tablet   TAKE ONE TABLET BY MOUTH DAILY     ??? VENTOLIN HFA 90 mcg/actuation inhaler INHALE TWO PUFFS BY MOUTH EVERY 6 HOURS AS NEEDED  0   ??? HYDROcodone-acetaminophen (NORCO) 5-325 mg per tablet TAKE 1 TABLET BY MOUTH EVERY 6 HOURS AS NEEDED FOR pain following TOENAIL precedure  0   ??? ipratropium (ATROVENT) 42 mcg (0.06 %) nasal spray INHALE TWO PUFFS IN EACH NOSTRIL EVERY DAY  1     No current facility-administered medications for this visit.      Following topics were discussed during  counseling:    1. Indications for medication, dosage and administration.     A. Harvoni 90/400mg  1 tablet to take daily with or without food.    2. Common side effects of medications and management strategies. (fatigue, headache)    3. Importance of adherence to regimen, follow-up clinic visits and lab monitoring.     A. Asked patient to call  [facility] [phone]  to establish start date for treatment and to schedule appointment 4 weeks before starting treatment.    4. Drug-drug interaction.    A. Current medications have been reviewed and assessed for possible interaction.  Denies antacid/heartburn med. We discussed the mechanism of drug-drug interaction with acid lowering agents. Advised to check with MD or pharmacist before taking any OTC/herbal medications, with emphasis regarding indigestion/heartburn medications.  Denies use of herbal medication such as milk thistle or St. John's wart.  Allergies have been verified. Denies alcohol for 4 years.    5. Importance of informing pharmacy and clinic of updated contact information.   Discussed the process of obtaining medication through specialty pharmacy and when approved medication will be delivered to patient's home. Stressed importance of being able to be reached over the phone.   6. Therapeutic Duplications: Patient had 3 different pain medications, Hydrocodone/APAP 5/325mg , oxycodone/APAP 5/325mg  and oxycodone 10/325mg . Advised pt of therapeutic duplication and instructed to not to take all of them together. Pt is taking oxycodone 10/325mg  now. Also pt had 2 albuterol inhalers (Proair and Ventolin). Advised not to use both at the same time. Reminded pt to rinse out her mouth after using Symbicort    Patient verbalized understanding. Provided contact information for any questions/concerns.       Park Breed, Pharm D., BCPS, BCGP, CPP  [facility]  [address]  [place], [place] [address]  [phone]    This portion of the visit was 20 minutes in duration and greater than 50% was spend in direct counseling and coordination of care regarding hepatitis C medication management.
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
## 2018-05-14 NOTE — Unmapped (Addendum)
[facility]  FAST ??? Fibrosis Assessment Team  Division of Gastroenterology and Hepatology  ??  ??    ??  FIBROSCAN will be performed to assess hepatic fibrosis (scarring) in order to stage this patient's liver disease. This will assist with evaluating the natural course of the disease and will provide important information regarding prognosis, duration of therapy, and potential response to treatment. This information will also help assess risk for hepatocellular carcinoma and need for liver cancer surveillance.??  ??  FibroscanProcedure:   After obtaining verbal consent, the patient was placed in a supine position. Physical characteristics and landmarks were assessed to establish appropriate mid-axillary intercostal space for probe placement. 50Hz  Shear Wave pulses were applied and the resulting Shear Wave and Propagation Speed detected with a 3.5 MHz ultrasonic signal, using the FibroScan probe.  Skin to liver capsule distance and liver parenchyma were accessed during the entire examination with the FibroScan probe. The patient was instructed to breathe normally and to abstain from sudden movements during the procedure, allowing for random measurements of liver stiffness. At least ten Shear Waves were produced; individual measurements of each Shear Wave were calculated. Patient tolerated the procedure well and was discharged without incident.  ??  Probe used []  M+    Serial # [id number]                         [x]  XL+   Serial # [id number]  ??  Main Etiology of Liver Disease:  [x] HCV     [] HBV   [] Alcohol    [] NASH  [] PBC      [] PSC     [] Other________________  ??  50Hz  shear wave pulses were applied and the resulting shear wave and propagation speed detected with a 3.5 MHz ultrasonic signal, using the FibroScan probe.     ??  At least ten Shear Waves were produced; individual measurements of each shear wave were calculated.    ??  Patient tolerated the procedure well and was discharged without incident.  ??  Fibroscan score: ___4.2___kPa  ??  IQR:                       ___12___%  ??  Test performed by: Luiz Ochoa, RN  ??  [facility]  FAST ??? Fibrosis Assessment Team  Division of Gastroenterology and Hepatology  ??  ??    ??  ??  ??  Estimation of the stage of liver fibrosis (Metavir Score):  The results of the Liver Stiffness Score are consistent with the following liver fibrosis stage:  ??  ??  [x]  F0-F1             []  F2               []  F3               []  F4  ??  ??  GENERAL RECOMMENDATIONS ACCORDING TO THE STAGE OF LIVER FIBROSIS.  ??  F0-F1: No-minimal fibrosis. The risk of progression to advanced fibrosis and cirrhosis is low. If the cause of liver disease is not removed, a 1-2 yr follow-up study is recommended.   F2: Significant fibrosis. There is a moderate risk of progression to cirrhosis. If the cause of liver disease is not removed, a follow-up study in 12 months is recommended.  F3: Advanced (pre-cirrhotic stage). The risk of progression to cirrhosis is high. Imaging studies to rule out hepatocellular carcinoma  should be considered. Efforts to remove the cause of liver disease are highly recommended.  F4: Cirrhosis. There is significant risk of portal hypertension and esophageal varices. An upper endoscopy is recommended. Imaging studies for hepatocellular carcinoma screening are recommended.   ??  Any and all FibroScan studies must be carefully evaluated, taking fully into account all individual measurement/scans, patient history and other factors.  As with liver biopsy, any estimation of liver fibrosis may be subject to under or over staging due to sampling error.  Any further medical or surgical intervention should be made only while fully considering the circumstances of this patient and in consultation with this patient.    I have reviewed and interpreted the FIBROSCAN test results as described above.

## 2018-05-15 ENCOUNTER — Telehealth (HOSPITAL_COMMUNITY): Payer: Self-pay | Admitting: *Deleted

## 2018-05-15 LAB — HEPATITIS B SURFACE ANTIBODY: HEPATITIS B SURFACE ANTIBODY QUANT: >1000 m[IU]/mL — ABNORMAL HIGH (ref <8.00)

## 2018-05-15 LAB — HEPATITIS A IGG: Hepatitis A virus Ab.IgG:PrThr:Pt:Ser:Ord:: REACTIVE — AB

## 2018-05-15 LAB — HEPATITIS B CORE TOTAL ANTIBODY: Hepatitis B virus core Ab:PrThr:Pt:Ser/Plas:Ord:IA: NONREACTIVE

## 2018-05-15 LAB — HEPATITIS B SURFACE ANTIGEN: Hepatitis B virus surface Ag:PrThr:Pt:Ser:Ord:: NONREACTIVE

## 2018-05-15 LAB — HEPATITIS B SURFACE ANTIBODY QUANT: Hepatitis B virus surface Ab:ACnc:Pt:Ser:Qn:: >1000 — ABNORMAL HIGH

## 2018-05-15 NOTE — Telephone Encounter (Signed)
05/15/18  Pt cx because she didn't schedule transportation with [facility]

## 2018-05-16 ENCOUNTER — Ambulatory Visit (HOSPITAL_COMMUNITY): Payer: Medicare Other | Admitting: Physical Therapy

## 2018-05-19 DIAGNOSIS — B1920 Unspecified viral hepatitis C without hepatic coma: Secondary | ICD-10-CM | POA: Diagnosis not present

## 2018-05-20 DIAGNOSIS — Z4789 Encounter for other orthopedic aftercare: Secondary | ICD-10-CM | POA: Diagnosis not present

## 2018-05-20 DIAGNOSIS — S62617D Displaced fracture of proximal phalanx of left little finger, subsequent encounter for fracture with routine healing: Secondary | ICD-10-CM | POA: Diagnosis not present

## 2018-05-26 MED ORDER — HARVONI 90 MG-400 MG TABLET
ORAL | 2 refills | 0 days
Start: 2018-05-26 — End: 2018-05-26

## 2018-05-26 MED ORDER — LEDIPASVIR 90 MG-SOFOSBUVIR 400 MG TABLET
ORAL_TABLET | Freq: Every day | ORAL | 2 refills | 0 days | Status: CP
Start: 2018-05-26 — End: 2018-05-28

## 2018-05-27 ENCOUNTER — Ambulatory Visit (HOSPITAL_COMMUNITY): Payer: Medicare Other | Attending: Family | Admitting: Physical Therapy

## 2018-05-27 ENCOUNTER — Telehealth (HOSPITAL_COMMUNITY): Payer: Self-pay | Admitting: Physical Therapy

## 2018-05-27 NOTE — Telephone Encounter (Signed)
Called pt re missed appointment.  Pt preoccupied with her wrist injury and forgot her appointment.  Pt will call back to make an appointment.  Lauren Fuentes, [place] CLT [phone]

## 2018-05-28 DIAGNOSIS — F319 Bipolar disorder, unspecified: Secondary | ICD-10-CM | POA: Diagnosis not present

## 2018-05-28 MED ORDER — HARVONI 90 MG-400 MG TABLET: 1 | each | 1 refills | 0 days

## 2018-05-28 MED ORDER — HARVONI 90 MG-400 MG TABLET
ORAL_TABLET | Freq: Every day | ORAL | 1 refills | 0.00000 days | Status: CP
Start: 2018-05-28 — End: 2018-05-28

## 2018-05-28 NOTE — Unmapped (Signed)
Patient calls requesting call back as she is unsure of how to mix her bowel prep  Returned call no answer  Left message which included name and telephone number and time the office closes

## 2018-05-28 NOTE — Unmapped (Signed)
Initial Counseling for HCV Treatment     Planned regimen: Harvoni (ledipasvir/sofosbuvir 90/400mg ) x 8 weeks  Planned start date: 05/31/18    Pharmacy: [facility] [phone] option #4    PMH:   Past Medical History:   Diagnosis Date   ??? Bipolar disorder (CMS-HCC)    ??? Constipation    ??? Hepatitis C antibody test positive    ??? Herpes infection    ??? Schizophrenia (CMS-HCC)      Current meds: Symbicort, albuterol prn, buspirone, Clarinex, estradiol, fluoxetine, trazodone, loxapine, lamotrigine, gabapentin, aprodine (triprolidine-pseudoephedrine) prn    Patient is ready to start Harvoni.     Following topics were discussed during counseling:   Patient Counseling    Counseled the patient on the following:  doses and administration discussed, safe handling, storage, and disposal discussed, possible adverse effects and management discussed, possible drug and prescription drug interactions discussed, possible drug and OTC drug and food interactions discussed, lab monitoring and follow-up discussed, cost of medications and cost implications discussed, adherence and missed doses discussed, pharmacy contact information discussed        1. Indications for medication, dosage and administration.     A. Harvoni 90/400mg  1 tablet to take daily with or without food. Patient plans to take in the mornings.      2. Common side effects of medications and management strategies. (fatigue, headache)      3. Importance of adherence to regimen, follow-up clinic visits and lab monitoring.   Medication Adherence    Demonstrates understanding of importance of adherence:  yes  Informant:  patient  Provider-estimated medication adherence level:  90-100%  Patient is at risk for Non-Adherence:  No  Reasons for non-adherence:  no problems identified  Adherence tools used:  alarm  Confirmed plan for next specialty medication refill:  delivery by pharmacy       A. Asked patient to call [place] [facility] [phone] to establish start date for treatment and to schedule appointment 4 weeks before starting treatment.      4. Drug-drug interaction.  Drug Interactions    Drug interactions evaluated:  yes  Clinically relevant drug interactions identified:  no       A. Current medications have been reviewed and assessed for possible interaction.  We discussed the mechanism of drug-drug interaction with acid lowering agents. Advised to check with MD or pharmacist before taking any OTC/herbal medications, with emphasis regarding indigestion/heartburn medications.  Denies use of herbal medication such as milk thistle or St. John's wart.  Allergies have been verified.      5. Importance of informing pharmacy and clinic of updated contact information. Stressed importance of being able to reach over the phone to set up refills. Advised patient to call pharmacy when down to about 7 day supply left to ensure there's no interruption in therapy.  Refill Coordination    Has the Patients' Contact Information Changed:  No  Is the Shipping Address Different:  Yes  Updated Shipping Address:  [address] [place] [address]        Shipping Information    Delivery Scheduled:  Yes  Delivery Date:  05/29/18       Patient verbalized understanding. Provided contact information for any questions/concerns.       Park Breed, Pharm D., BCPS, BCGP, CPP  [facility]  [address]  [place], [place] [address]  [phone]    May 28, 2018 3:49 PM

## 2018-05-28 NOTE — Unmapped (Signed)
Upon benefit investigation, only Brand name Harvoni is covered for 8 week duration despite requesting 12 weeks. New rx sent to [facility].

## 2018-05-28 NOTE — Unmapped (Signed)
Having great difficulty managing the bowel prep process  Is overwhelmed with the paperwork received    Please start the prep @ 1800 this pm  Drink 1/2    Get up @ 0400 in the am and finish the last 1/2  Patient has bus ride  May have clears up to three hours before procedure.      Became somewhat frustrated with writers attempts to explain prep process  Has call back number if need be

## 2018-05-29 ENCOUNTER — Encounter: Admit: 2018-05-29 | Discharge: 2018-05-29 | Payer: MEDICARE

## 2018-05-29 ENCOUNTER — Ambulatory Visit: Admit: 2018-05-29 | Discharge: 2018-05-29 | Payer: MEDICARE

## 2018-05-29 DIAGNOSIS — B1920 Unspecified viral hepatitis C without hepatic coma: Secondary | ICD-10-CM | POA: Diagnosis not present

## 2018-05-29 DIAGNOSIS — J45909 Unspecified asthma, uncomplicated: Secondary | ICD-10-CM | POA: Diagnosis not present

## 2018-05-29 DIAGNOSIS — K644 Residual hemorrhoidal skin tags: Secondary | ICD-10-CM | POA: Diagnosis not present

## 2018-05-29 DIAGNOSIS — K641 Second degree hemorrhoids: Secondary | ICD-10-CM | POA: Diagnosis not present

## 2018-05-29 DIAGNOSIS — D369 Benign neoplasm, unspecified site: Secondary | ICD-10-CM | POA: Diagnosis not present

## 2018-05-29 DIAGNOSIS — K6289 Other specified diseases of anus and rectum: Secondary | ICD-10-CM | POA: Diagnosis not present

## 2018-05-29 DIAGNOSIS — K648 Other hemorrhoids: Secondary | ICD-10-CM | POA: Diagnosis not present

## 2018-05-29 DIAGNOSIS — Z1211 Encounter for screening for malignant neoplasm of colon: Secondary | ICD-10-CM | POA: Diagnosis not present

## 2018-05-29 DIAGNOSIS — D122 Benign neoplasm of ascending colon: Secondary | ICD-10-CM | POA: Diagnosis not present

## 2018-05-29 DIAGNOSIS — F209 Schizophrenia, unspecified: Secondary | ICD-10-CM | POA: Diagnosis not present

## 2018-05-29 MED FILL — HARVONI/90-400MG/TABS: HARVONI/90-400MG/TABS | 28 days supply | Qty: 28 | Fill #0

## 2018-05-29 NOTE — Unmapped (Signed)
See Provation note in Procedures tab and full report with images in Media tab labeled Operative Procedure Reports

## 2018-06-03 DIAGNOSIS — L03031 Cellulitis of right toe: Secondary | ICD-10-CM | POA: Diagnosis not present

## 2018-06-04 ENCOUNTER — Ambulatory Visit: Admit: 2018-06-04 | Discharge: 2018-06-04 | Payer: MEDICARE

## 2018-06-04 DIAGNOSIS — B1920 Unspecified viral hepatitis C without hepatic coma: Secondary | ICD-10-CM | POA: Diagnosis not present

## 2018-06-04 DIAGNOSIS — R932 Abnormal findings on diagnostic imaging of liver and biliary tract: Secondary | ICD-10-CM | POA: Diagnosis not present

## 2018-06-05 DIAGNOSIS — M79642 Pain in left hand: Secondary | ICD-10-CM | POA: Diagnosis not present

## 2018-06-05 DIAGNOSIS — Z4789 Encounter for other orthopedic aftercare: Secondary | ICD-10-CM | POA: Diagnosis not present

## 2018-06-05 DIAGNOSIS — S62617D Displaced fracture of proximal phalanx of left little finger, subsequent encounter for fracture with routine healing: Secondary | ICD-10-CM | POA: Diagnosis not present

## 2018-06-12 NOTE — Unmapped (Signed)
Pt started harvoni on 6/13 from what we can figure out -she has 20 tablets after today's dose on hand   She had me call shane /debbie to see what her start date was  From what we can all tell she started June 13th  She did not have me update her address at this time for the delivery  Confirmed the address of [address] [place] [address] for delivery on July 5th    [facility] Refill Coordination Note    Specialty Medication(s) to be Shipped:   Infectious Disease: Harvoni    Other medication(s) to be shipped: n/a     Amanda Moore, DOB: Nov 14, 1966  Phone: [phone] (home)   Shipping Address: [address]  [address]  [place] [address]    All above HIPAA information was verified with patient.     Completed refill call assessment today to schedule patient's medication shipment from the [facility] [phone]).       Specialty medication(s) and dose(s) confirmed: Regimen is correct and unchanged.   Changes to medications: Amanda Moore reports no changes reported at this time.  Changes to insurance: No  Questions for the pharmacist: No    The patient will receive an FSI print out for each medication shipped and additional FDA Medication Guides as required.  Patient education from Amanda Moore or Amanda Moore may also be included in the shipment.    DISEASE-SPECIFIC INFORMATION        For Hepatitis C patients:  Treatment start date: 06/05/18  Current treatment week: 1    ADHERENCE     Medication Adherence    Patient reported X missed doses in the last month:  0  Specialty Medication:  HARVONI  Patient is on additional specialty medications:  No  Patient is on more than two specialty medications:  No  Any gaps in refill history greater than 2 weeks in the last 3 months:  no  Demonstrates understanding of importance of adherence:  yes  Informant:  patient  Reliability of informant:  reliable  Adherence tools used:  alarm  Confirmed plan for next specialty medication refill:  delivery by pharmacy  Refills needed for supportive medications:  not needed          Refill Coordination    Has the Patients' Contact Information Changed:  No  Is the Shipping Address Different:  No           SHIPPING     Shipping address confirmed in FSI.     Delivery Scheduled: Yes, Expected medication delivery date: 06/27/18 via UPS or courier.     Amanda Moore   [facility] Specialty Technician

## 2018-06-16 DIAGNOSIS — M79642 Pain in left hand: Secondary | ICD-10-CM | POA: Diagnosis not present

## 2018-06-18 ENCOUNTER — Encounter (HOSPITAL_COMMUNITY): Payer: Self-pay | Admitting: Physical Therapy

## 2018-06-18 NOTE — Therapy (Signed)
[place], [place], [address] Phone: [phone]   Fax:  [phone]  Patient Details  Name: Lauren Fuentes MRN: [id number] Date of Birth: Jan 20, 1966 Referring Provider:  No ref. provider found  Encounter Date: 06/18/2018   PHYSICAL THERAPY DISCHARGE SUMMARY  Visits from Start of Care: 4  Current functional level related to goals / functional outcomes: Pt has not returned since 04/22/2018.  At this time she stated that she could tell she was improving    Remaining deficits: Incontinence with coughing, sneezing    Education / Equipment: HEP Plan: Patient agrees to discharge.  Patient goals were partially met. Patient is being discharged due to not returning since the last visit.  ?????       Rayetta Humphrey, PT CLT [phone] 06/18/2018, 11:50 AM  [facility] [address] [place], [place], [address] Phone: [phone]   Fax:  [phone]

## 2018-06-24 DIAGNOSIS — M79642 Pain in left hand: Secondary | ICD-10-CM | POA: Diagnosis not present

## 2018-06-25 MED FILL — HARVONI/90-400MG/TABS: HARVONI/90-400MG/TABS | 28 days supply | Qty: 28 | Fill #1

## 2018-07-08 ENCOUNTER — Ambulatory Visit: Admit: 2018-07-08 | Discharge: 2018-07-08 | Disposition: A | Discharge status: Home / Self Care | Payer: MEDICARE

## 2018-07-08 ENCOUNTER — Emergency Department: Admit: 2018-07-08 | Discharge: 2018-07-08 | Disposition: A | Discharge status: Home / Self Care | Payer: MEDICARE

## 2018-07-08 DIAGNOSIS — R0789 Other chest pain: Secondary | ICD-10-CM | POA: Diagnosis not present

## 2018-07-08 DIAGNOSIS — A6004 Herpesviral vulvovaginitis: Secondary | ICD-10-CM | POA: Diagnosis not present

## 2018-07-08 DIAGNOSIS — F4310 Post-traumatic stress disorder, unspecified: Secondary | ICD-10-CM | POA: Diagnosis not present

## 2018-07-08 DIAGNOSIS — B182 Chronic viral hepatitis C: Secondary | ICD-10-CM | POA: Diagnosis not present

## 2018-07-08 DIAGNOSIS — F419 Anxiety disorder, unspecified: Secondary | ICD-10-CM | POA: Diagnosis not present

## 2018-07-08 DIAGNOSIS — Z87891 Personal history of nicotine dependence: Secondary | ICD-10-CM | POA: Diagnosis not present

## 2018-07-08 DIAGNOSIS — R748 Abnormal levels of other serum enzymes: Secondary | ICD-10-CM | POA: Diagnosis not present

## 2018-07-08 DIAGNOSIS — E785 Hyperlipidemia, unspecified: Secondary | ICD-10-CM | POA: Diagnosis not present

## 2018-07-08 DIAGNOSIS — J45909 Unspecified asthma, uncomplicated: Secondary | ICD-10-CM | POA: Diagnosis not present

## 2018-07-08 DIAGNOSIS — R110 Nausea: Secondary | ICD-10-CM | POA: Diagnosis not present

## 2018-07-08 DIAGNOSIS — R05 Cough: Secondary | ICD-10-CM | POA: Diagnosis not present

## 2018-07-08 DIAGNOSIS — R42 Dizziness and giddiness: Secondary | ICD-10-CM | POA: Diagnosis not present

## 2018-07-08 DIAGNOSIS — Z5181 Encounter for therapeutic drug level monitoring: Secondary | ICD-10-CM | POA: Diagnosis not present

## 2018-07-08 DIAGNOSIS — R0981 Nasal congestion: Secondary | ICD-10-CM | POA: Diagnosis not present

## 2018-07-08 DIAGNOSIS — F319 Bipolar disorder, unspecified: Secondary | ICD-10-CM | POA: Diagnosis not present

## 2018-07-08 DIAGNOSIS — R0602 Shortness of breath: Secondary | ICD-10-CM | POA: Diagnosis not present

## 2018-07-08 DIAGNOSIS — F209 Schizophrenia, unspecified: Secondary | ICD-10-CM | POA: Diagnosis not present

## 2018-07-08 DIAGNOSIS — R197 Diarrhea, unspecified: Secondary | ICD-10-CM | POA: Diagnosis not present

## 2018-07-08 DIAGNOSIS — R112 Nausea with vomiting, unspecified: Secondary | ICD-10-CM | POA: Diagnosis not present

## 2018-07-08 DIAGNOSIS — R51 Headache: Secondary | ICD-10-CM | POA: Diagnosis not present

## 2018-07-08 DIAGNOSIS — Z7951 Long term (current) use of inhaled steroids: Secondary | ICD-10-CM | POA: Diagnosis not present

## 2018-07-08 DIAGNOSIS — Z79899 Other long term (current) drug therapy: Secondary | ICD-10-CM | POA: Diagnosis not present

## 2018-07-08 LAB — URINALYSIS
BILIRUBIN UA: NEGATIVE
BLOOD UA: NEGATIVE
GLUCOSE UA: NEGATIVE
HYALINE CASTS: 5 /LPF — ABNORMAL HIGH (ref <=0)
KETONES UA: NEGATIVE
PH UA: 6 (ref 5.0–9.0)
SPECIFIC GRAVITY UA: >=1.03 (ref 1.003–1.035)
SQUAMOUS EPITHELIAL: 7 /HPF — ABNORMAL HIGH (ref 0–5)
WBC UA: 10 /HPF — ABNORMAL HIGH (ref 0–5)

## 2018-07-08 LAB — CBC W/ AUTO DIFF
BASOPHILS RELATIVE PERCENT: 0.6 %
EOSINOPHILS ABSOLUTE COUNT: 0.2 10*9/L (ref 0.0–0.4)
EOSINOPHILS RELATIVE PERCENT: 3.1 %
HEMATOCRIT: 43.2 % (ref 36.0–46.0)
HEMOGLOBIN: 13.7 g/dL (ref 12.0–16.0)
LARGE UNSTAINED CELLS: 3 % (ref 0–4)
LYMPHOCYTES RELATIVE PERCENT: 44.4 %
MEAN CORPUSCULAR HEMOGLOBIN CONC: 31.8 g/dL (ref 31.0–37.0)
MEAN CORPUSCULAR HEMOGLOBIN: 28.8 pg (ref 26.0–34.0)
MEAN CORPUSCULAR VOLUME: 90.6 fL (ref 80.0–100.0)
MEAN PLATELET VOLUME: 6.9 fL — ABNORMAL LOW (ref 7.0–10.0)
MONOCYTES ABSOLUTE COUNT: 0.4 10*9/L (ref 0.2–0.8)
MONOCYTES RELATIVE PERCENT: 5.7 %
NEUTROPHILS ABSOLUTE COUNT: 3.2 10*9/L (ref 2.0–7.5)
NEUTROPHILS RELATIVE PERCENT: 43.6 %
PLATELET COUNT: 302 10*9/L (ref 150–440)
RED BLOOD CELL COUNT: 4.76 10*12/L (ref 4.00–5.20)
RED CELL DISTRIBUTION WIDTH: 13.4 % (ref 12.0–15.0)
WBC ADJUSTED: 7.2 10*9/L (ref 4.5–11.0)

## 2018-07-08 LAB — BASIC METABOLIC PANEL
ANION GAP: 5 mmol/L — ABNORMAL LOW (ref 9–15)
BLOOD UREA NITROGEN: 10 mg/dL (ref 7–21)
BUN / CREAT RATIO: 9
CHLORIDE: 108 mmol/L — ABNORMAL HIGH (ref 98–107)
CO2: 27 mmol/L (ref 22.0–30.0)
CREATININE: 1.1 mg/dL — ABNORMAL HIGH (ref 0.60–1.00)
EGFR CKD-EPI AA FEMALE: 66 mL/min/{1.73_m2} (ref >=60)
EGFR CKD-EPI NON-AA FEMALE: 57 mL/min/{1.73_m2} — ABNORMAL LOW (ref >=60)
GLUCOSE RANDOM: 86 mg/dL (ref 65–179)
POTASSIUM: 4.8 mmol/L (ref 3.5–5.0)
SODIUM: 140 mmol/L (ref 135–145)

## 2018-07-08 LAB — BILIRUBIN TOTAL: Bilirubin:MCnc:Pt:Ser/Plas:Qn:: 0.6

## 2018-07-08 LAB — HEPATIC FUNCTION PANEL
ALBUMIN: 4.1 g/dL (ref 3.5–5.0)
ALKALINE PHOSPHATASE: 74 U/L (ref 38–126)
BILIRUBIN DIRECT: 0.2 mg/dL (ref 0.00–0.40)
BILIRUBIN TOTAL: 0.6 mg/dL (ref 0.0–1.2)
PROTEIN TOTAL: 7.3 g/dL (ref 6.5–8.3)

## 2018-07-08 LAB — MUCUS

## 2018-07-08 LAB — EGFR CKD-EPI NON-AA FEMALE: Lab: 57 — ABNORMAL LOW

## 2018-07-08 LAB — MEAN CORPUSCULAR HEMOGLOBIN: Lab: 28.8

## 2018-07-08 LAB — GAMMA GLUTAMYL TRANSFERASE: Gamma glutamyl transferase:CCnc:Pt:Ser/Plas:Qn:: 12

## 2018-07-08 MED ORDER — BENZONATATE 100 MG CAPSULE
ORAL_CAPSULE | Freq: Three times a day (TID) | ORAL | 0 refills | 0 days | Status: CP
Start: 2018-07-08 — End: 2018-07-29

## 2018-07-08 MED ORDER — ONDANSETRON 4 MG DISINTEGRATING TABLET
ORAL_TABLET | Freq: Three times a day (TID) | ORAL | 0 refills | 0.00000 days | Status: CP | PRN
Start: 2018-07-08 — End: 2018-11-18

## 2018-07-08 NOTE — Unmapped (Signed)
SUBJECTIVE:  Chief Complaint: shortness of breath, cough    Amanda Moore is a 52 y.o. 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 Symptoms started about three weeks ago. She reports that she called 911 last week. She asked EMT for a breathing treatment but they said she had anxiety and had used her inhaler too often.     She started taking Harvoni in early June and suspects that some of her symptoms may be side effects. She has discussed this with her doctor at her appointment before coming here.  Pt hands me and underlined list of possible side effects associated with the medication and believes she has them all.     She was told last year after having the flu that she had asthma. Denies history prior. She was put on Symbicort twice daily for maintance. Reports that she hasn't needed to use ventolin in several months. She is currently using ventolin using about 2-3 times per day and symbicort using 3 times per day. Admits to nasal congestion, headache, and congestion feels stuck in her throat. Denies sinus pressure or pain. Denies known fever.      Quit smoking cigarettes 3 years ago. She started smoking in 8th grade and would vary from a few cigarettes to a PPD. Denies recreational drug use currently. She has been clean for 4 years. She used crack cocaine and drank alcohol on a regular basis. She tried heroine but never used regularly.     She also has a psychiatrist for medication management. Denies recent medication changes. Last saw her psyctricist about a month ago. Usually goes every 3 months. Next visit in sept. Dr. Geanie Cooley in [facility].     She has an appointment with her family doctor on the 19th at [facility].     Denies recent travel or trauma. She had a fracture of her left hand which she had surgery on in June. Denies history of blood clots, clotting disorder, estrogen therapy, or cancer.     Denies peripheral edema or recent weight changes.     Denies dysuria, urinary urgency, or frequency.       ROS is negative for 10 systems except as noted in the HPI.       Past Medical History:   Diagnosis Date   ??? Anxiety    ??? Asthma    ??? Bipolar disorder (CMS-HCC)    ??? Constipation    ??? Hepatitis C antibody test positive    ??? Herpes infection    ??? Schizophrenia (CMS-HCC)        No Known Allergies    No current facility-administered medications for this encounter.     Current Outpatient Medications:   ???  budesonide-formoterol (SYMBICORT) 80-4.5 mcg/actuation inhaler, Symbicort 80 mcg-4.5 mcg/actuation HFA aerosol inhaler  INHALE TWO PUFFS INTO THE LUNGS TWICE DAILY, Disp: , Rfl:   ???  busPIRone (BUSPAR) 10 MG tablet, Take 10 mg by mouth Two (2) times a day., Disp: , Rfl:   ???  desloratadine (CLARINEX) 5 mg tablet, Take 5 mg by mouth daily., Disp: , Rfl: 1  ???  estradiol (ESTRACE) 0.5 MG tablet, estradiol 0.5 mg tablet  TAKE 1 TABLET BY MOUTH DAILY, Disp: , Rfl:   ???  FLUoxetine (PROZAC) 20 MG capsule, Take 20 mg by mouth daily., Disp: , Rfl:   ???  fluticasone (FLONASE) 50 mcg/actuation nasal spray, 2 sprays by Each Nare route daily., Disp: 16 g, Rfl: 5  ???  fluticasone (FLOVENT DISKUS) 100 mcg/actuation DsDv diskus, Inhale 1 puff Two (2) times a day., Disp: , Rfl:   ???  gabapentin (NEURONTIN) 100 MG capsule, gabapentin 100 mg capsule  TAKE ONE CAPSULE BY MOUTH THREE TIMES DAILY, Disp: , Rfl:   ???  HARVONI tablet, Take 1 tablet by mouth daily., Disp: 28 tablet, Rfl: 1  ???  HYDROcodone-acetaminophen (NORCO) 5-325 mg per tablet, TAKE 1 TABLET BY MOUTH EVERY 6 HOURS AS NEEDED FOR pain following TOENAIL precedure, Disp: , Rfl: 0  ???  ipratropium (ATROVENT) 42 mcg (0.06 %) nasal spray, INHALE TWO PUFFS IN EACH NOSTRIL EVERY DAY, Disp: , Rfl: 1  ???  lamoTRIgine (LAMICTAL) 100 MG tablet, Take 100 mg by mouth Two (2) times a day. , Disp: , Rfl:   ???  loxapine (LOXITANE) 10 MG capsule, Take 10 mg by mouth Two (2) times a day., Disp: , Rfl:   ???  mupirocin (BACTROBAN) 2 % ointment, APPLY TO THE AFFECTED AREA(S) ON EACH BIG TOE TWICE DAILY, Disp: , Rfl: 0  ???  oxyCODONE-acetaminophen (PERCOCET) 10-325 mg per tablet, TAKE 1 TABLET BY MOUTH EVERY 4 TO 6 HOURS AS NEEDED FOR PAIN, Disp: , Rfl: 0  ???  polyethylene glycol (MIRALAX) 17 gram packet, Take 17 g by mouth three (3) times a day (at 6am, noon and 6pm)., Disp: , Rfl:   ???  traZODone (DESYREL) 100 MG tablet, Take 150 mg by mouth nightly. , Disp: , Rfl:   ???  triprolidine-pseudoephedrine (APRODINE) 2.5-60 mg Tab, Take 1 tablet by mouth Every four (4) hours., Disp: , Rfl:   ???  valACYclovir (VALTREX) 500 MG tablet, valacyclovir 500 mg tablet  TAKE ONE TABLET BY MOUTH DAILY, Disp: , Rfl:   ???  VENTOLIN HFA 90 mcg/actuation inhaler, INHALE TWO PUFFS BY MOUTH EVERY 6 HOURS AS NEEDED, Disp: , Rfl: 0    Family History   Problem Relation Age of Onset   ??? Bipolar disorder Mother    ??? Diabetes Mother    ??? Hypertension Mother    ??? Prostate cancer Father    ??? Diabetes Father    ??? Cancer Father         prostate?   ??? Leukemia Paternal Grandfather    ??? Colon cancer Other         siblings   ??? Melanoma Other         siblings   ??? Bipolar disorder Other         siblings   ??? Throat cancer Maternal Aunt    ??? Cancer Maternal Aunt         throat   ??? No Known Problems Son    ??? No Known Problems Son    ??? Cancer Sister 54        melanama   ??? Cancer Maternal Uncle         prostate       Social History     Social History Narrative   ??? Not on file       Past Surgical History:   Procedure Laterality Date   ??? BACK SURGERY  2016   ??? FOOT SURGERY  2017   ??? HAND SURGERY  04/2018   ??? PR ANAL PRESSURE RECORD N/A 11/13/2017  Procedure: ANORECTAL MANOMETRY;  Surgeon: Nurse-Based Giproc;  [facility];  Service: Gastroenterology   ??? PR COLONOSCOPY W/BIOPSY SINGLE/MULTIPLE N/A 05/29/2018    Procedure: COLONOSCOPY, FLEXIBLE, PROXIMAL TO SPLENIC FLEXURE; WITH BIOPSY, SINGLE OR MULTIPLE;  Surgeon: Alfred Levins, MD;  Location: [facility] PROCEDURES [facility];  Service: Gastroenterology   ??? TUBAL LIGATION     ??? WISDOM TOOTH EXTRACTION           OBJECTIVE:  BP 140/88  - Pulse 83  - Temp 36.7 ??C (98 ??F) (Oral)  - Resp 16  - Ht 162.6 cm (5' 4)  - Wt 92.7 kg (204 lb 6.4 oz)  - SpO2 98%  - BMI 35.09 kg/m??   CONSTITUTIONAL: Anxious caucasian female appears comfortable, nontoxic in no acute distress. Alert and oriented x 3  EYES: Sclera anicteric. PERRLA. Extraocular movement intact.   ENT-   EARS: Canals clear. TMs without erythema, landmarks well visualized.   NOSE: No nasal discharge.  No frontal/ maxillary tenderness.   THROAT: Pharynx with minimal erythema, no exudates. Uvula midline.   LYMPHATIC/ ENDOCRINE: No cervical lymphadenopathy  CARDIOVASCULAR: Regular, rate, rhythm. Normal S1,S2. No murmurs, gallops, or rubs. Legs appears symmetric with no peripheral edema.  Peripheral pulses 2+  RESPIRATORY: Clear to auscultation bilaterally. No wheezing, rhonchi, or rales with forced expiration. Normal respiratory effort.  GASTROINTESTINAL: Soft, nondistended, subjective generalized tenderness. Bowel sounds present. No guarding, rebound tenderness, or rigidity.   MUSKULOSKELETAL:  Normal range of motion all extremities. Strength 5/5.  NEUROLOGIC: Normal sensation to light touch symmetric.  No focal neuro deficits.   SKIN: well perfused without rashes.   PSYCHIATRIC: Speech is mildly pressured but behavior appropriate   ??    CXR: Radiographically clear lungs. No pleural effusion or pneumothorax. Unremarkable cardiomediastinal silhouette.     No results found.  Labs Reviewed - No data to display      ASSESSMENT:  1. Cough    2. Nausea    3. Anxiety          PLAN:  I have reviewed the CXR and agree with the radiologist impression. While the patient waited for the radiologist impression, I had the lab draw the orders placed by her hepatologist. I have reviewed results with not acute changes.     This was a very extensive visit with a lot of redirection.     I have prescribed zofran as needed for nausea and tessalon to take as needed for cough suppression. May continue inhalers as prescribed not to exceed normal dosing instruction. Due to lungs being clear in the office, I do not believe that oral prednisone is indicated at this time. Pulse 83 with pO2 98%     The patient referenced the side effect profile of Harvoni on many occasions. I spoke with Owens Shark, the nurse practitioner that the patient had seen earlier who thought that may of the patient's complains were unlikely related to [facility]. She had that the headache, dizziness, nausea, and loose stools could be associated but the patient only has 3 weeks left of treatment and would benefit from the completion of therapy.     Advised to follow up with PCP for appointment on Friday or sooner if symptoms worsen. I also believe following up with her psychiatrist would be in her best interest. Will contact with pending labs if there are any changes to this plan.            Brett Canales, [place]  07/08/18  [id number]

## 2018-07-08 NOTE — Unmapped (Signed)
Pt here with shortness of breath, cough, wheezing, chest tightness, body aches and anxiety. Symptoms started about three weeks ago and she called 911 last week sometime. She asked EMT for a breathing treatment but they said she had anxiety and had used her inhaler too much. She started taking Harvoni in early June and suspects she has side effects. She has discussed this with her doctor. She also has a psychiatrist for medication management. Here for evaluation of her breathing.

## 2018-07-08 NOTE — Unmapped (Signed)
c/o loose/yellow BM's,  acid reflux, chest tightness, a cough and 'wheezing'.  hoarseness.  Increased fatigue. dizziiness pt. admits to having asthma.
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
## 2018-07-08 NOTE — Unmapped (Signed)
1.  Laboratory studies ordered for monitoring of your hepatitis C status.   2.  Continue taking Harvoni one tablet daily. Avoid missing any doses. Make sure taking medication same time of the day.   3.  Okay to take Tylenol up to 2,000 mg total daily for headaches. Make sure if taking other medications for pain, that if acetaminophen is present in that medication, you do not take additional amounts of Tylenol.   4.  Increase water intake. Making sure you are staying very well hydrated.   5.  Exchange toothbrushes, razor blades, etc during treatment.   6.  Your current partner needs to be screened for hepatitis C as well as both your children.   7.  Office follow up four weeks.

## 2018-07-09 LAB — HCV RNA COMMENT: Lab: 0

## 2018-07-09 LAB — HEPATITIS C RNA, QUANTITATIVE, PCR: HCV RNA: NOT DETECTED

## 2018-07-10 NOTE — Unmapped (Signed)
results and follow up reminder given. pt verbalized understanding with no further questions.

## 2018-07-10 NOTE — Unmapped (Signed)
-----   Message from [facility] [place] sent at 07/10/2018  1:32 PM EDT -----  Regarding: HCV level   Eber Jones,    Please let Ms. Vieyra know that her HCV RNA level is not detected at week #6. She needs to continue taking Harvoni as prescribed. Only a few more weeks of treatment....  She needs to follow up as scheduled in 4 weeks.     Dawn

## 2018-07-11 DIAGNOSIS — G8929 Other chronic pain: Secondary | ICD-10-CM | POA: Diagnosis not present

## 2018-07-11 DIAGNOSIS — K759 Inflammatory liver disease, unspecified: Secondary | ICD-10-CM | POA: Diagnosis not present

## 2018-07-11 DIAGNOSIS — F419 Anxiety disorder, unspecified: Secondary | ICD-10-CM | POA: Diagnosis not present

## 2018-07-11 DIAGNOSIS — R05 Cough: Secondary | ICD-10-CM | POA: Diagnosis not present

## 2018-07-11 DIAGNOSIS — S62619A Displaced fracture of proximal phalanx of unspecified finger, initial encounter for closed fracture: Secondary | ICD-10-CM | POA: Diagnosis not present

## 2018-07-11 DIAGNOSIS — F3160 Bipolar disorder, current episode mixed, unspecified: Secondary | ICD-10-CM | POA: Diagnosis not present

## 2018-07-11 DIAGNOSIS — F329 Major depressive disorder, single episode, unspecified: Secondary | ICD-10-CM | POA: Diagnosis not present

## 2018-07-11 NOTE — Unmapped (Signed)
I spoke with patient on the morning of 07/11/18. She has a follow up with her PCP today at 9AM. I have advised her to have them draw a D-dimer to rule out clot as cause of unexplained shortness of breath. Pt is agreeable and will relay the message. I have provided the offices contact information and advised her to have her primary care provider call me with any additional questions.

## 2018-07-17 NOTE — Unmapped (Signed)
Follow-Up Counseling for HCV Treatment    Regimen: Harvoni (ledipasvir/sofosbuvir 90/400mg ) x 8 weeks  Start Date: 06/05/18  Completed Treatment Week #6    Pharmacy: [facility] [phone]     Following topics were reviewed during the phone call:  Patient Counseling    Counseled the patient on the following:  doses and administration discussed, possible adverse effects and management discussed, possible drug and prescription drug interactions discussed, possible drug and OTC drug and food interactions discussed, lab monitoring and follow-up discussed, therapeutic rationale discussed, adherence and missed doses discussed, pharmacy contact information discussed         1. Medication administration - Takes Harvoni every day between 10:00 and 11:00 am.     2. Importance of adherence -   Medication Adherence    Patient reported X missed doses in the last month:  2  Specialty Medication:  Harvoni  Patient is on additional specialty medications:  No  Patient is on more than two specialty medications:  No  Any gaps in refill history greater than 2 weeks in the last 3 months:  no  Demonstrates understanding of importance of adherence:  yes  Informant:  patient  Reliability of informant:  fairly reliable  Provider-estimated medication adherence level:  76-89%  Patient is at risk for Non-Adherence:  Yes  The following intervention(s) were discussed with the patient:  Cell phone, Pill boxes, Alarm Clock, Daily routines  Reasons for non-adherence:  patient forgets  Adherence tools used:  alarm        Pill count over the phone revealed #16 tablets which is inappropriate. Pill count should be 14 tablets including today's dose. Pt does not recall missing any doses. Advised pt to set her phone alarm for 11:00 am, and to take her medication at that time. Advised pt to use a pill box as a visual tool. Stressed the importance of adherence and not missing any doses.     3. Side effects - Pt denies any side effects.   Adverse Effects    *All other systems reviewed and are negative          4. Drug-drug interaction -Pt stated she was prescribed Tessalon Perles 100 mg every 8 hrs for 7 days for her cough. Pt stated she quit drinking alcohol.   Drug Interactions    Drug interactions evaluated:  yes  Clinically relevant drug interactions identified:  no         5. Follow up - Has follow up appointment scheduled in HCV treatment clinic on 08/12/18 @ 0830 with Owens Shark, DNP.Informed patient that there is still a small chance of relapse after finishing the treatment. Stressed importance of follow up 3 months post treatment to assess for cure.      All questions were answered.      Vertell Limber RN, [facility]   Pharmacy Adult GI Medicine  [facility]  [address]   [place], [place] [address]  [phone]    July 17, 2018 11:43 AM

## 2018-07-29 ENCOUNTER — Ambulatory Visit: Admit: 2018-07-29 | Discharge: 2018-07-29 | Payer: MEDICARE

## 2018-07-29 ENCOUNTER — Encounter: Admit: 2018-07-29 | Discharge: 2018-07-29 | Payer: MEDICARE | Attending: Anesthesiology | Primary: Anesthesiology

## 2018-07-29 DIAGNOSIS — J45909 Unspecified asthma, uncomplicated: Secondary | ICD-10-CM | POA: Diagnosis not present

## 2018-07-29 DIAGNOSIS — B1920 Unspecified viral hepatitis C without hepatic coma: Secondary | ICD-10-CM | POA: Diagnosis not present

## 2018-07-29 DIAGNOSIS — K6289 Other specified diseases of anus and rectum: Secondary | ICD-10-CM | POA: Diagnosis not present

## 2018-07-29 DIAGNOSIS — K642 Third degree hemorrhoids: Secondary | ICD-10-CM | POA: Diagnosis not present

## 2018-07-29 DIAGNOSIS — K644 Residual hemorrhoidal skin tags: Secondary | ICD-10-CM | POA: Diagnosis not present

## 2018-07-29 DIAGNOSIS — F209 Schizophrenia, unspecified: Secondary | ICD-10-CM | POA: Diagnosis not present

## 2018-07-29 DIAGNOSIS — K625 Hemorrhage of anus and rectum: Secondary | ICD-10-CM | POA: Diagnosis not present

## 2018-07-31 MED ORDER — DOCUSATE SODIUM 100 MG CAPSULE
ORAL_CAPSULE | 5 refills | 0 days | Status: CP
Start: 2018-07-31 — End: 2019-02-03

## 2018-07-31 MED ORDER — HYDROCORTISONE ACETATE 25 MG RECTAL SUPPOSITORY
Freq: Once | RECTAL | 1 refills | 0 days | Status: CP
Start: 2018-07-31 — End: 2018-07-31

## 2018-08-01 DIAGNOSIS — M79642 Pain in left hand: Secondary | ICD-10-CM | POA: Diagnosis not present

## 2018-08-06 DIAGNOSIS — M79642 Pain in left hand: Secondary | ICD-10-CM | POA: Diagnosis not present

## 2018-08-06 DIAGNOSIS — S62617D Displaced fracture of proximal phalanx of left little finger, subsequent encounter for fracture with routine healing: Secondary | ICD-10-CM | POA: Diagnosis not present

## 2018-08-12 ENCOUNTER — Ambulatory Visit: Admit: 2018-08-12 | Discharge: 2018-08-12 | Payer: MEDICARE | Attending: Family | Primary: Family

## 2018-08-12 DIAGNOSIS — B182 Chronic viral hepatitis C: Secondary | ICD-10-CM | POA: Diagnosis not present

## 2018-08-12 DIAGNOSIS — Z7951 Long term (current) use of inhaled steroids: Secondary | ICD-10-CM | POA: Diagnosis not present

## 2018-08-12 DIAGNOSIS — Z87891 Personal history of nicotine dependence: Secondary | ICD-10-CM | POA: Diagnosis not present

## 2018-08-12 DIAGNOSIS — E669 Obesity, unspecified: Secondary | ICD-10-CM | POA: Diagnosis not present

## 2018-08-12 DIAGNOSIS — R945 Abnormal results of liver function studies: Secondary | ICD-10-CM | POA: Diagnosis not present

## 2018-08-12 DIAGNOSIS — F319 Bipolar disorder, unspecified: Secondary | ICD-10-CM | POA: Diagnosis not present

## 2018-08-12 DIAGNOSIS — F4310 Post-traumatic stress disorder, unspecified: Secondary | ICD-10-CM | POA: Diagnosis not present

## 2018-08-12 DIAGNOSIS — E785 Hyperlipidemia, unspecified: Secondary | ICD-10-CM | POA: Diagnosis not present

## 2018-08-12 DIAGNOSIS — J45909 Unspecified asthma, uncomplicated: Secondary | ICD-10-CM | POA: Diagnosis not present

## 2018-08-12 DIAGNOSIS — F209 Schizophrenia, unspecified: Secondary | ICD-10-CM | POA: Diagnosis not present

## 2018-08-12 DIAGNOSIS — F419 Anxiety disorder, unspecified: Secondary | ICD-10-CM | POA: Diagnosis not present

## 2018-08-13 DIAGNOSIS — M79642 Pain in left hand: Secondary | ICD-10-CM | POA: Diagnosis not present

## 2018-08-15 ENCOUNTER — Other Ambulatory Visit: Payer: Self-pay

## 2018-08-15 ENCOUNTER — Encounter (HOSPITAL_COMMUNITY): Payer: Self-pay | Admitting: Emergency Medicine

## 2018-08-15 ENCOUNTER — Emergency Department (HOSPITAL_COMMUNITY)
Admission: EM | Admit: 2018-08-15 | Discharge: 2018-08-15 | Disposition: A | Discharge status: Home / Self Care | Payer: Medicare Other | Attending: Emergency Medicine | Admitting: Emergency Medicine

## 2018-08-15 DIAGNOSIS — F4310 Post-traumatic stress disorder, unspecified: Secondary | ICD-10-CM | POA: Insufficient documentation

## 2018-08-15 DIAGNOSIS — F250 Schizoaffective disorder, bipolar type: Secondary | ICD-10-CM | POA: Diagnosis present

## 2018-08-15 DIAGNOSIS — E669 Obesity, unspecified: Secondary | ICD-10-CM | POA: Diagnosis present

## 2018-08-15 DIAGNOSIS — Z915 Personal history of self-harm: Secondary | ICD-10-CM | POA: Diagnosis not present

## 2018-08-15 DIAGNOSIS — F251 Schizoaffective disorder, depressive type: Secondary | ICD-10-CM | POA: Diagnosis not present

## 2018-08-15 DIAGNOSIS — F17210 Nicotine dependence, cigarettes, uncomplicated: Secondary | ICD-10-CM | POA: Insufficient documentation

## 2018-08-15 DIAGNOSIS — F603 Borderline personality disorder: Secondary | ICD-10-CM | POA: Diagnosis present

## 2018-08-15 DIAGNOSIS — Z008 Encounter for other general examination: Secondary | ICD-10-CM | POA: Insufficient documentation

## 2018-08-15 DIAGNOSIS — R441 Visual hallucinations: Secondary | ICD-10-CM | POA: Diagnosis present

## 2018-08-15 DIAGNOSIS — M1990 Unspecified osteoarthritis, unspecified site: Secondary | ICD-10-CM | POA: Diagnosis present

## 2018-08-15 DIAGNOSIS — M79672 Pain in left foot: Secondary | ICD-10-CM | POA: Diagnosis present

## 2018-08-15 DIAGNOSIS — R45851 Suicidal ideations: Secondary | ICD-10-CM | POA: Insufficient documentation

## 2018-08-15 DIAGNOSIS — B009 Herpesviral infection, unspecified: Secondary | ICD-10-CM | POA: Diagnosis present

## 2018-08-15 DIAGNOSIS — Z79899 Other long term (current) drug therapy: Secondary | ICD-10-CM | POA: Diagnosis not present

## 2018-08-15 DIAGNOSIS — R45850 Homicidal ideations: Secondary | ICD-10-CM | POA: Diagnosis present

## 2018-08-15 DIAGNOSIS — J45909 Unspecified asthma, uncomplicated: Secondary | ICD-10-CM | POA: Diagnosis present

## 2018-08-15 LAB — SALICYLATE LEVEL: Salicylate Lvl: <7 mg/dL (ref 2.8–30.0)

## 2018-08-15 LAB — ACETAMINOPHEN LEVEL: Acetaminophen (Tylenol), Serum: <10 ug/mL — ABNORMAL LOW (ref 10–30)

## 2018-08-15 LAB — CBC
HEMATOCRIT: 42.7 % (ref 36.0–46.0)
Hemoglobin: 14.5 g/dL (ref 12.0–15.0)
MCH: 30.1 pg (ref 26.0–34.0)
MCHC: 34 g/dL (ref 30.0–36.0)
MCV: 88.8 fL (ref 78.0–100.0)
Platelets: 267 10*3/uL (ref 150–400)
RBC: 4.81 MIL/uL (ref 3.87–5.11)
RDW: 13 % (ref 11.5–15.5)
WBC: 8.4 10*3/uL (ref 4.0–10.5)

## 2018-08-15 LAB — COMPREHENSIVE METABOLIC PANEL
ALBUMIN: 4.3 g/dL (ref 3.5–5.0)
ALK PHOS: 62 U/L (ref 38–126)
ALT: 18 U/L (ref 0–44)
AST: 24 U/L (ref 15–41)
Anion gap: 10 (ref 5–15)
BILIRUBIN TOTAL: 0.7 mg/dL (ref 0.3–1.2)
BUN: 16 mg/dL (ref 6–20)
CO2: 24 mmol/L (ref 22–32)
CREATININE: 1.35 mg/dL — AB (ref 0.44–1.00)
Calcium: 9.7 mg/dL (ref 8.9–10.3)
Chloride: 102 mmol/L (ref 98–111)
GFR calc Af Amer: 51 mL/min — ABNORMAL LOW (ref >60)
GFR, EST NON AFRICAN AMERICAN: 44 mL/min — AB (ref >60)
Glucose, Bld: 104 mg/dL — ABNORMAL HIGH (ref 70–99)
POTASSIUM: 3.7 mmol/L (ref 3.5–5.1)
Sodium: 136 mmol/L (ref 135–145)
TOTAL PROTEIN: 7.7 g/dL (ref 6.5–8.1)

## 2018-08-15 LAB — I-STAT BETA HCG BLOOD, ED (MC, WL, AP ONLY): HCG, QUANTITATIVE: 5.9 m[IU]/mL — AB (ref <5)

## 2018-08-15 LAB — RAPID URINE DRUG SCREEN, HOSP PERFORMED
AMPHETAMINES: POSITIVE — AB
BARBITURATES: NOT DETECTED
BENZODIAZEPINES: NOT DETECTED
Cocaine: NOT DETECTED
Opiates: NOT DETECTED
Tetrahydrocannabinol: NOT DETECTED

## 2018-08-15 LAB — ETHANOL: Alcohol, Ethyl (B): <10 mg/dL (ref <10)

## 2018-08-15 MED ORDER — ONDANSETRON HCL 4 MG PO TABS: 4.0000 mg | ORAL_TABLET | Freq: Three times a day (TID) | ORAL | Status: DC | PRN
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
## 2018-08-15 NOTE — Progress Notes (Signed)
Per Priscille Loveless, NP, pt meets inpatient criteria. Referral information has been sent to the following hospitals for review: [facility]  [facility]  [facility]   CSW will continue to assist with placement needs.   Audree Camel, LCSW, [place] Disposition [facility] BHH/TTS [phone]

## 2018-08-15 NOTE — ED Notes (Signed)
Attempted to call [facility] to [facility]. No answer.   PD also contacted regarding transportation to [facility].

## 2018-08-15 NOTE — ED Provider Notes (Signed)
At this time patient is alert and cooperative.  She continues to want help, and understands that she will be transferred for admission.   Daleen Bo, MD 08/15/18 1911

## 2018-08-15 NOTE — ED Notes (Signed)
Per [facility], [facility] is assessing pt case and considering pt for placement. [facility] requesting up to date vitals. Primary RN aware.

## 2018-08-15 NOTE — ED Notes (Signed)
[facility] accepting pt. [facility] is to arrive [facility] by 9pm to the [employer]. Accepting Physician is Enzo Bi. Number for report is [phone]

## 2018-08-15 NOTE — ED Notes (Addendum)
Pt brought with her a quart sized ziploc bag completely full of yellow and green capsules. Pt reports they are her schizophrenic medication that she has been "saving up to overdose on". Pt states, "some days I take my medication, some days I don't". Medication and all other pt belongings were taken home by pt's case worker and housing Freight forwarder who brought pt in.

## 2018-08-15 NOTE — Progress Notes (Signed)
Pt accepted to [facility]; [facility] [address]. Enzo Bi is the accepting/attending provider. Call report to [phone] [facility] @ [facility] ED notified.   Pt is involuntary and will need to be transported by law enforcement.  Pt may arrive as soon as transportation is arranged. [facility] requests pt arrive before 9pm if possible.   CSW has faxed pt's IVC paperwork to [facility].   Audree Camel, LCSW, [place] Disposition [facility] BHH/TTS [phone]

## 2018-08-15 NOTE — ED Notes (Signed)
Patient states "You have my permission to tell [name] anything you need to about my care." [name] is with patient and states her phone number is [phone].

## 2018-08-15 NOTE — ED Notes (Signed)
[place] called and inquired about pt history and commitment status.
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
## 2018-08-15 NOTE — ED Triage Notes (Signed)
Patient states "I"m suicidal. If I don't get any help, I'm going to go home and kill myself. I've been feeling this way for a long time." Patient has a large bag of pills with her that she states she has been saving to overdose with. States the pills are loxapine.

## 2018-09-08 DIAGNOSIS — K759 Inflammatory liver disease, unspecified: Secondary | ICD-10-CM | POA: Diagnosis not present

## 2018-09-08 DIAGNOSIS — F3160 Bipolar disorder, current episode mixed, unspecified: Secondary | ICD-10-CM | POA: Diagnosis not present

## 2018-09-08 DIAGNOSIS — Z23 Encounter for immunization: Secondary | ICD-10-CM | POA: Diagnosis not present

## 2018-09-10 DIAGNOSIS — F319 Bipolar disorder, unspecified: Secondary | ICD-10-CM | POA: Diagnosis not present

## 2018-09-26 ENCOUNTER — Encounter (HOSPITAL_COMMUNITY): Payer: Self-pay | Admitting: *Deleted

## 2018-09-26 ENCOUNTER — Inpatient Hospital Stay (HOSPITAL_COMMUNITY)
Admission: AD | Admit: 2018-09-26 | Discharge: 2018-10-03 | DRG: 885 | Disposition: A | Discharge status: Home / Self Care | Payer: Medicare Other | Source: Intra-hospital | Attending: Psychiatry | Admitting: Psychiatry

## 2018-09-26 ENCOUNTER — Other Ambulatory Visit: Payer: Self-pay

## 2018-09-26 ENCOUNTER — Emergency Department (HOSPITAL_COMMUNITY)
Admission: EM | Admit: 2018-09-26 | Discharge: 2018-09-26 | Disposition: A | Discharge status: Other Acute Inpatient Hospital | Payer: Medicare Other | Attending: Emergency Medicine | Admitting: Emergency Medicine

## 2018-09-26 ENCOUNTER — Encounter (HOSPITAL_COMMUNITY): Payer: Self-pay

## 2018-09-26 ENCOUNTER — Emergency Department (HOSPITAL_COMMUNITY): Payer: Medicare Other

## 2018-09-26 DIAGNOSIS — M1990 Unspecified osteoarthritis, unspecified site: Secondary | ICD-10-CM | POA: Diagnosis present

## 2018-09-26 DIAGNOSIS — Z79899 Other long term (current) drug therapy: Secondary | ICD-10-CM | POA: Insufficient documentation

## 2018-09-26 DIAGNOSIS — M545 Low back pain: Secondary | ICD-10-CM | POA: Diagnosis not present

## 2018-09-26 DIAGNOSIS — Z9851 Tubal ligation status: Secondary | ICD-10-CM

## 2018-09-26 DIAGNOSIS — B182 Chronic viral hepatitis C: Secondary | ICD-10-CM | POA: Diagnosis present

## 2018-09-26 DIAGNOSIS — F259 Schizoaffective disorder, unspecified: Secondary | ICD-10-CM | POA: Diagnosis present

## 2018-09-26 DIAGNOSIS — F1099 Alcohol use, unspecified with unspecified alcohol-induced disorder: Secondary | ICD-10-CM | POA: Diagnosis not present

## 2018-09-26 DIAGNOSIS — Z23 Encounter for immunization: Secondary | ICD-10-CM | POA: Diagnosis not present

## 2018-09-26 DIAGNOSIS — M25551 Pain in right hip: Secondary | ICD-10-CM | POA: Diagnosis not present

## 2018-09-26 DIAGNOSIS — F17210 Nicotine dependence, cigarettes, uncomplicated: Secondary | ICD-10-CM | POA: Diagnosis present

## 2018-09-26 DIAGNOSIS — K219 Gastro-esophageal reflux disease without esophagitis: Secondary | ICD-10-CM | POA: Diagnosis not present

## 2018-09-26 DIAGNOSIS — E785 Hyperlipidemia, unspecified: Secondary | ICD-10-CM | POA: Diagnosis present

## 2018-09-26 DIAGNOSIS — Z818 Family history of other mental and behavioral disorders: Secondary | ICD-10-CM

## 2018-09-26 DIAGNOSIS — Z7951 Long term (current) use of inhaled steroids: Secondary | ICD-10-CM | POA: Diagnosis not present

## 2018-09-26 DIAGNOSIS — F250 Schizoaffective disorder, bipolar type: Secondary | ICD-10-CM | POA: Diagnosis not present

## 2018-09-26 DIAGNOSIS — I10 Essential (primary) hypertension: Secondary | ICD-10-CM | POA: Diagnosis not present

## 2018-09-26 DIAGNOSIS — M549 Dorsalgia, unspecified: Secondary | ICD-10-CM | POA: Diagnosis present

## 2018-09-26 DIAGNOSIS — S3992XA Unspecified injury of lower back, initial encounter: Secondary | ICD-10-CM | POA: Diagnosis not present

## 2018-09-26 DIAGNOSIS — G8929 Other chronic pain: Secondary | ICD-10-CM | POA: Diagnosis present

## 2018-09-26 DIAGNOSIS — S79911A Unspecified injury of right hip, initial encounter: Secondary | ICD-10-CM | POA: Diagnosis not present

## 2018-09-26 LAB — COMPREHENSIVE METABOLIC PANEL
ALT: 13 U/L (ref 0–44)
ANION GAP: 6 (ref 5–15)
AST: 21 U/L (ref 15–41)
Albumin: 4.2 g/dL (ref 3.5–5.0)
Alkaline Phosphatase: 60 U/L (ref 38–126)
BILIRUBIN TOTAL: 0.5 mg/dL (ref 0.3–1.2)
BUN: 13 mg/dL (ref 6–20)
CALCIUM: 9.4 mg/dL (ref 8.9–10.3)
CO2: 29 mmol/L (ref 22–32)
CREATININE: 1.3 mg/dL — AB (ref 0.44–1.00)
Chloride: 100 mmol/L (ref 98–111)
GFR calc non Af Amer: 46 mL/min — ABNORMAL LOW (ref >60)
GFR, EST AFRICAN AMERICAN: 53 mL/min — AB (ref >60)
GLUCOSE: 122 mg/dL — AB (ref 70–99)
Potassium: 4.3 mmol/L (ref 3.5–5.1)
Sodium: 135 mmol/L (ref 135–145)
TOTAL PROTEIN: 7.6 g/dL (ref 6.5–8.1)

## 2018-09-26 LAB — RAPID URINE DRUG SCREEN, HOSP PERFORMED
AMPHETAMINES: NOT DETECTED
BARBITURATES: NOT DETECTED
BENZODIAZEPINES: NOT DETECTED
Cocaine: NOT DETECTED
Opiates: NOT DETECTED
Tetrahydrocannabinol: NOT DETECTED

## 2018-09-26 LAB — URINALYSIS, ROUTINE W REFLEX MICROSCOPIC
Bacteria, UA: NONE SEEN
Bilirubin Urine: NEGATIVE
GLUCOSE, UA: NEGATIVE mg/dL
KETONES UR: NEGATIVE mg/dL
NITRITE: NEGATIVE
PH: 6 (ref 5.0–8.0)
Protein, ur: NEGATIVE mg/dL
SPECIFIC GRAVITY, URINE: 1.013 (ref 1.005–1.030)

## 2018-09-26 LAB — CBC WITH DIFFERENTIAL/PLATELET
Basophils Absolute: 0 10*3/uL (ref 0.0–0.1)
Basophils Relative: 1 %
Eosinophils Absolute: 0.1 10*3/uL (ref 0.0–0.7)
Eosinophils Relative: 1 %
HEMATOCRIT: 39.9 % (ref 36.0–46.0)
HEMOGLOBIN: 13.5 g/dL (ref 12.0–15.0)
LYMPHS ABS: 2.7 10*3/uL (ref 0.7–4.0)
LYMPHS PCT: 33 %
MCH: 30.1 pg (ref 26.0–34.0)
MCHC: 33.8 g/dL (ref 30.0–36.0)
MCV: 89.1 fL (ref 78.0–100.0)
MONOS PCT: 8 %
Monocytes Absolute: 0.6 10*3/uL (ref 0.1–1.0)
NEUTROS ABS: 4.9 10*3/uL (ref 1.7–7.7)
NEUTROS PCT: 59 %
Platelets: 245 10*3/uL (ref 150–400)
RBC: 4.48 MIL/uL (ref 3.87–5.11)
RDW: 13.4 % (ref 11.5–15.5)
WBC: 8.4 10*3/uL (ref 4.0–10.5)

## 2018-09-26 LAB — ACETAMINOPHEN LEVEL

## 2018-09-26 LAB — SALICYLATE LEVEL

## 2018-09-26 LAB — ETHANOL: Alcohol, Ethyl (B): <10 mg/dL (ref <10)

## 2018-09-26 MED ORDER — CEPHALEXIN 500 MG PO CAPS
500.0000 mg | ORAL_CAPSULE | Freq: Two times a day (BID) | ORAL | Status: DC
  Administered 2018-09-26 (×2): 500 mg via ORAL
  Filled 2018-09-26 (×2): qty 1

## 2018-09-26 MED ORDER — ALBUTEROL SULFATE HFA 108 (90 BASE) MCG/ACT IN AERS: 1.0000 | INHALATION_SPRAY | Freq: Four times a day (QID) | RESPIRATORY_TRACT | Status: DC | PRN

## 2018-09-26 MED ORDER — LORAZEPAM 1 MG PO TABS
1.0000 mg | ORAL_TABLET | Freq: Four times a day (QID) | ORAL | Status: DC | PRN
  Administered 2018-09-26 – 2018-10-03 (×12): 1 mg via ORAL
  Filled 2018-09-26 (×12): qty 1

## 2018-09-26 MED ORDER — TRAZODONE HCL 50 MG PO TABS: 50.0000 mg | ORAL_TABLET | Freq: Every evening | ORAL | Status: DC | PRN

## 2018-09-26 MED ORDER — HALOPERIDOL 5 MG PO TABS: 5.0000 mg | ORAL_TABLET | Freq: Four times a day (QID) | ORAL | Status: DC | PRN

## 2018-09-26 MED ORDER — LORATADINE 10 MG PO TABS
10.0000 mg | ORAL_TABLET | Freq: Every day | ORAL | Status: DC
  Administered 2018-09-26: 10 mg via ORAL
  Filled 2018-09-26: qty 1

## 2018-09-26 MED ORDER — PNEUMOCOCCAL VAC POLYVALENT 25 MCG/0.5ML IJ INJ
0.5000 mL | INJECTION | INTRAMUSCULAR | Status: AC
  Administered 2018-09-27: 0.5 mL via INTRAMUSCULAR

## 2018-09-26 MED ORDER — LOXAPINE SUCCINATE 5 MG PO CAPS
10.0000 mg | ORAL_CAPSULE | Freq: Three times a day (TID) | ORAL | Status: DC
  Administered 2018-09-26: 10 mg via ORAL
  Filled 2018-09-26 (×4): qty 1
  Filled 2018-09-26: qty 2
  Filled 2018-09-26 (×6): qty 1

## 2018-09-26 MED ORDER — MOMETASONE FURO-FORMOTEROL FUM 100-5 MCG/ACT IN AERO
2.0000 | INHALATION_SPRAY | Freq: Two times a day (BID) | RESPIRATORY_TRACT | Status: DC
  Administered 2018-09-26: 2 via RESPIRATORY_TRACT
  Filled 2018-09-26: qty 8.8

## 2018-09-26 MED ORDER — MAGNESIUM HYDROXIDE 400 MG/5ML PO SUSP: 30.0000 mL | Freq: Every day | ORAL | Status: DC | PRN

## 2018-09-26 MED ORDER — FLUOXETINE HCL 20 MG PO CAPS
20.0000 mg | ORAL_CAPSULE | Freq: Every morning | ORAL | Status: DC
  Administered 2018-09-26: 20 mg via ORAL
  Filled 2018-09-26: qty 1

## 2018-09-26 MED ORDER — ACETAMINOPHEN 325 MG PO TABS
650.0000 mg | ORAL_TABLET | Freq: Four times a day (QID) | ORAL | Status: DC | PRN
  Administered 2018-09-27 – 2018-09-28 (×4): 650 mg via ORAL
  Filled 2018-09-26 (×4): qty 2

## 2018-09-26 MED ORDER — DIPHENHYDRAMINE HCL 25 MG PO CAPS: 50.0000 mg | ORAL_CAPSULE | Freq: Four times a day (QID) | ORAL | Status: DC | PRN

## 2018-09-26 MED ORDER — CEPHALEXIN 500 MG PO CAPS
500.0000 mg | ORAL_CAPSULE | Freq: Once | ORAL | Status: AC
  Administered 2018-09-26: 500 mg via ORAL
  Filled 2018-09-26: qty 1

## 2018-09-26 MED ORDER — LORAZEPAM 2 MG/ML IJ SOLN: 1.0000 mg | Freq: Four times a day (QID) | INTRAMUSCULAR | Status: DC | PRN

## 2018-09-26 MED ORDER — ALUM & MAG HYDROXIDE-SIMETH 200-200-20 MG/5ML PO SUSP
30.0000 mL | ORAL | Status: DC | PRN
  Administered 2018-09-27 – 2018-10-03 (×7): 30 mL via ORAL
  Filled 2018-09-26 (×7): qty 30

## 2018-09-26 MED ORDER — HALOPERIDOL LACTATE 5 MG/ML IJ SOLN: 5.0000 mg | Freq: Four times a day (QID) | INTRAMUSCULAR | Status: DC | PRN

## 2018-09-26 MED ORDER — MONTELUKAST SODIUM 10 MG PO TABS: 10.0000 mg | ORAL_TABLET | Freq: Every day | ORAL | Status: DC

## 2018-09-26 MED ORDER — BUSPIRONE HCL 5 MG PO TABS
10.0000 mg | ORAL_TABLET | Freq: Three times a day (TID) | ORAL | Status: DC
  Administered 2018-09-26: 10 mg via ORAL
  Filled 2018-09-26: qty 2

## 2018-09-26 MED ORDER — HYDROXYZINE HCL 25 MG PO TABS
25.0000 mg | ORAL_TABLET | Freq: Three times a day (TID) | ORAL | Status: DC | PRN
  Administered 2018-09-27: 25 mg via ORAL
  Filled 2018-09-26: qty 1

## 2018-09-26 MED ORDER — DIPHENHYDRAMINE HCL 50 MG/ML IJ SOLN: 50.0000 mg | Freq: Four times a day (QID) | INTRAMUSCULAR | Status: DC | PRN

## 2018-09-26 MED ORDER — LAMOTRIGINE 25 MG PO TABS
100.0000 mg | ORAL_TABLET | Freq: Two times a day (BID) | ORAL | Status: DC
  Administered 2018-09-26 (×2): 100 mg via ORAL
  Filled 2018-09-26 (×2): qty 4

## 2018-09-26 MED ORDER — TRAZODONE HCL 100 MG PO TABS
100.0000 mg | ORAL_TABLET | Freq: Every evening | ORAL | Status: DC | PRN
  Filled 2018-09-26 (×6): qty 1

## 2018-09-26 MED ORDER — HYDROXYZINE HCL 25 MG PO TABS
50.0000 mg | ORAL_TABLET | Freq: Three times a day (TID) | ORAL | Status: DC
  Administered 2018-09-26: 50 mg via ORAL
  Filled 2018-09-26: qty 2

## 2018-09-26 MED ORDER — NICOTINE 21 MG/24HR TD PT24
21.0000 mg | MEDICATED_PATCH | Freq: Every day | TRANSDERMAL | Status: DC
  Administered 2018-09-27 – 2018-10-03 (×7): 21 mg via TRANSDERMAL
  Filled 2018-09-26 (×10): qty 1

## 2018-09-26 MED ORDER — TRAZODONE HCL 50 MG PO TABS: 300.0000 mg | ORAL_TABLET | Freq: Every day | ORAL | Status: DC
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
## 2018-09-26 NOTE — Progress Notes (Signed)
D: Patient observed resting in bed. Promptly came up for medications per this writer's request. Patient superficial and states, "I'm doing great! I'm hoping he lets me go home tomorrow. [birth year] my [birth year]." Patient's affect bright and animated, mood euphoric with some anxiety noted. Denies pain, physical complaints.   A: Trazadone ordered (scheduled) however review of chart indicates abnormal EKG. Reviewed with Lonell Grandchild, PA and order given to hold trazadone. Prn ativan given for anxiety and to promote rest. Medication education provided. Level III obs in place for safety. Emotional support offered.  Encouraged to attend and participate in unit programming.    R: Patient verbalizes understanding of POC. On reassess, patient is more relaxed, resting in bed. Patient denies SI/HI/AVH and remains safe on level III obs. Will continue to monitor throughout the night.

## 2018-09-26 NOTE — ED Notes (Signed)
Pt medically cleared.

## 2018-09-26 NOTE — ED Notes (Signed)
[place] trans. 45 min out

## 2018-09-26 NOTE — Progress Notes (Signed)
Patient ID: Lauren Fuentes, female   DOB: 1966/09/09, 52 y.o.   MRN: [id number] PER STATE REGULATIONS 482.30  THIS CHART WAS REVIEWED FOR MEDICAL NECESSITY WITH RESPECT TO THE PATIENT'S ADMISSION/DURATION OF STAY.  NEXT REVIEW DATE:09/30/18 [name], RN, BSN CASE MANAGER
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
## 2018-09-26 NOTE — ED Provider Notes (Signed)
[facility] EMERGENCY DEPARTMENT Provider Note   CSN: [id number] Arrival date & time: 09/26/18  0027     History   Chief Complaint Chief Complaint  Patient presents with  . Medical Clearance    HPI Lauren Fuentes is a 52 y.o. female.  Patient brought to the ED by the police.  She apparently called them because she felt like somebody was breaking into her house and pounding on her walls.  They did not find anyone and they transported her here for psychiatric evaluation.  Patient has a history of bipolar disorder and schizoaffective disorder on multiple medications which she states she "thinks" she has been taking.  She denies any suicidal or homicidal thoughts.  She states she hears voices occasionally but they are "unrelated to today".  Denies any head, neck, back, chest or abdominal pain.  Good p.o. intake and urine output.  Denies any alcohol or illicit drug use. Patient states she feels unsafe to go back to her home because she is worried that someone is there.  The history is provided by the patient and the police.    Past Medical History:  Diagnosis Date  . Arthritis   . Back pain, chronic   . Bipolar 1 disorder ([place])   . Calcaneal fracture, Left  10/19/2014  . Fracture of metatarsal bone of right foot, 4th neck 10/22/2014  . GERD (gastroesophageal reflux disease)   . Headache    hx migraines, tension headaches  . Hepatitis    stated in the past - pt denies this as of 07/28/15)  . Lumbar compression fracture, L1-2 endplate  37/48/2707  . Post traumatic stress disorder (PTSD)   . Schizoaffective disorder ([place])   . Thoracic compression fracture, T12 endplate  86/75/4492    Patient Active Problem List   Diagnosis Date Noted  . Chronic venous insufficiency 03/09/2016  . Hepatitis C 03/05/2016  . Varicose veins of lower extremities with complications 01/00/7121  . Chronic hepatitis C virus infection ([place]) 12/15/2015  . Allergic rhinitis 12/09/2015  . Insomnia 12/09/2015   . Genital herpes 12/09/2015  . Hyperlipidemia 12/09/2015  . Menopausal symptom 12/09/2015  . Compression fracture of body of thoracic vertebra ([place]) 02/17/2015  . Lumbar compression fracture, L1-2 endplate  97/58/8325  . Thoracic compression fracture, T12 endplate  49/82/6415  . Fracture of metatarsal bone of right foot, 4th neck 10/22/2014  . Has jumped from building 10/22/2014  . Bipolar 1 disorder ([place])   . Schizoaffective disorder ([place])   . Calcaneal fracture, Left  10/19/2014    Past Surgical History:  Procedure Laterality Date  . COLONOSCOPY    . HARDWARE REMOVAL Left 07/29/2015   Procedure: HARDWARE REMOVAL,LEFT HEEL;  Surgeon: Altamese Ansted, MD;  Location: [place];  Service: Orthopedics;  Laterality: Left;  . HEMORRHOID SURGERY    . KYPHOPLASTY N/A 02/17/2015   Procedure: T12 KYPHOPLASTY;  Surgeon: Melina Schools, MD;  Location: [place];  Service: Orthopedics;  Laterality: N/A;  . ORIF CALCANEOUS FRACTURE Left 10/21/2014   Procedure: OPEN REDUCTION INTERNAL FIXATION (ORIF) LEFT CALCANEOUS FRACTURE;  Surgeon: Rozanna Box, MD;  Location: [place];  Service: Orthopedics;  Laterality: Left;  . STERIOD INJECTION Right 07/29/2015   Procedure: Subtalar Joint injection with Fluoro guidance ;  Surgeon: Altamese [place], MD;  Location: [place];  Service: Orthopedics;  Laterality: Right;  . TUBAL LIGATION       OB History   None      Home Medications    Prior to Admission  medications   Medication Sig Start Date End Date Taking? Authorizing Provider  albuterol (PROVENTIL HFA;VENTOLIN HFA) 108 (90 Base) MCG/ACT inhaler Inhale 1-2 puffs into the lungs every 6 (six) hours as needed for wheezing or shortness of breath.    [provider]  budesonide-formoterol (SYMBICORT) 80-4.5 MCG/ACT inhaler Inhale 2 puffs into the lungs 2 (two) times daily.    [provider]  busPIRone (BUSPAR) 10 MG tablet Take 10 mg by mouth 3 (three) times daily.     [provider]    desloratadine (CLARINEX) 5 MG tablet Take 5 mg by mouth daily.  03/06/18   [provider]  FLUoxetine (PROZAC) 20 MG capsule Take 20 mg by mouth every morning.     [provider]  fluticasone (FLONASE) 50 MCG/ACT nasal spray Place 2 sprays into both nostrils daily. 04/23/16   Evelina Dun A, FNP  hydrOXYzine (ATARAX/VISTARIL) 50 MG tablet Take 50 mg by mouth 3 (three) times daily.    [provider]  lamoTRIgine (LAMICTAL) 100 MG tablet Take 100 mg by mouth 2 (two) times daily.    [provider]  loxapine (LOXITANE) 10 MG capsule Take 10 mg by mouth 3 (three) times daily. 04/05/16   [provider]  montelukast (SINGULAIR) 10 MG tablet Take 10 mg by mouth at bedtime.    [provider]  oxyCODONE-acetaminophen (PERCOCET) 10-325 MG tablet Take 1 tablet by mouth every 4 (four) hours as needed for pain.    [provider]  Polyethyl Glycol-Propyl Glycol (SYSTANE FREE OP) Apply 1-2 drops to eye daily as needed.     [provider]  traZODone (DESYREL) 150 MG tablet Take 300 mg by mouth at bedtime.    [provider]  triprolidine-pseudoephedrine (APRODINE) 2.5-60 MG TABS tablet Take 1 tablet by mouth every 4 (four) hours as needed for allergies or congestion.     [provider]  valACYclovir (VALTREX) 500 MG tablet TAKE 1 TABLET BY MOUTH 2 TIMES DAILY. Patient taking differently: Take 500 mg by mouth daily.  07/26/16   Timmothy Euler, MD    Family History Family History  Problem Relation Age of Onset  . Hypertension Mother   . Bipolar disorder Mother   . Hypertension Father   . Cancer Father     Social History Social History   Tobacco Use  . Smoking status: Current Some Day Smoker    Packs/day: 0.25    Years: 18.00    Pack years: 4.50    Types: Cigarettes    Last attempt to quit: 04/27/2015    Years since quitting: 3.4  . Smokeless tobacco: Never Used  Substance Use Topics  . Alcohol use:  Yes    Alcohol/week: 0.0 standard drinks    Comment: rare  . Drug use: Yes    Types: Cocaine    Comment: last use 09/2014      Allergies   Patient has no known allergies.   Review of Systems Review of Systems  Constitutional: Negative for activity change, appetite change and fever.  HENT: Negative for congestion and rhinorrhea.   Respiratory: Negative for chest tightness and shortness of breath.   Cardiovascular: Negative for chest pain.  Gastrointestinal: Negative for abdominal pain, nausea and vomiting.  Musculoskeletal: Negative for back pain.  Psychiatric/Behavioral: Positive for confusion and hallucinations. Negative for sleep disturbance and suicidal ideas. The patient is nervous/anxious.     all other systems are negative except as noted in the HPI and PMH.  Physical Exam Updated Vital Signs BP (!) 129/97 (BP Location: Left Arm)   Pulse 98   Temp 98.7 F (37.1 C)   Resp 18   Ht 5\' 4"  (1.626 m)   Wt 91.6 kg   LMP 12/13/2015 (Approximate)   SpO2 97%   BMI 34.67 kg/m   Physical Exam  Constitutional: She is oriented to person, place, and time. She appears well-developed and well-nourished. No distress.  Mildly anxious appearing  Tangential and disorganized thoughts  HENT:  Head: Normocephalic and atraumatic.  Mouth/Throat: Oropharynx is clear and moist. No oropharyngeal exudate.  Eyes: Pupils are equal, round, and reactive to light. Conjunctivae and EOM are normal.  Neck: Normal range of motion. Neck supple.  No meningismus.  Cardiovascular: Normal rate, regular rhythm, normal heart sounds and intact distal pulses.  No murmur heard. Pulmonary/Chest: Effort normal and breath sounds normal. No respiratory distress.  Abdominal: Soft. There is no tenderness. There is no rebound and no guarding.  Musculoskeletal: Normal range of motion. She exhibits no edema or tenderness.  Neurological: She is alert and oriented to person, place, and time. No cranial nerve  deficit. She exhibits normal muscle tone. Coordination normal.   5/5 strength throughout. CN 2-12 intact.Equal grip strength.   Skin: Skin is warm.  Psychiatric:  Anxious, pressured speech, tangential and disorganized thoughts  Nursing note and vitals reviewed.    ED Treatments / Results  Labs (all labs ordered are listed, but only abnormal results are displayed) Labs Reviewed  COMPREHENSIVE METABOLIC PANEL - Abnormal; Notable for the following components:      Result Value   Glucose, Bld 122 (*)    Creatinine, Ser 1.30 (*)    GFR calc non Af Amer 46 (*)    GFR calc Af Amer 53 (*)    All other components within normal limits  ACETAMINOPHEN LEVEL - Abnormal; Notable for the following components:   Acetaminophen (Tylenol), Serum <10 (*)    All other components within normal limits  URINALYSIS, ROUTINE W REFLEX MICROSCOPIC - Abnormal; Notable for the following components:   APPearance HAZY (*)    Hgb urine dipstick SMALL (*)    Leukocytes, UA LARGE (*)    All other components within normal limits  URINE CULTURE  CBC WITH DIFFERENTIAL/PLATELET  ETHANOL  SALICYLATE LEVEL  RAPID URINE DRUG SCREEN, HOSP PERFORMED    EKG None  Radiology No results found.  Procedures Procedures (including critical care time)  Medications Ordered in ED Medications - No data to display   Initial Impression / Assessment and Plan / ED Course  I have reviewed the triage vital signs and the nursing notes.  Pertinent labs & imaging results that were available during my care of the patient were reviewed by me and considered in my medical decision making (see chart for details).    Patient with paranoia and possible hallucinations.  History of bipolar disorder and schizoaffective disorder she is calm and cooperative.  No homicidal or suicidal thoughts.  Screening labs obtained and unremarkable.  Possible urinary tract infection.  Patient is medically clear for TTS consult.  Patient does meet  criteria for inpatient treatment.  Holding orders placed.  Will treat urinary tract infection with 7 days of Keflex. Final Clinical Impressions(s) / ED Diagnoses   Final diagnoses:  None    ED Discharge Orders    None       Rossanna Spitzley, Annie Main, MD 09/26/18 [phone]

## 2018-09-26 NOTE — BH Assessment (Signed)
Patient accepted to [facility] by Darnelle Maffucci, NP. The attending provider is Dr. Parke Poisson. Room assignment is 401-1. Patient is currently voluntary and will be transported by [facility]. Nursing report [phone]. Patient's nurse [facility]) was updated on patient's disposition.

## 2018-09-26 NOTE — ED Notes (Signed)
Pt stated she fell off toilet onto her butt, stated it 'hurt but does not think anything is broken'. Fall was unwitnessed but RN Janett Billow heard the fall and immediately assisted the pt back into bed. Dr Wyvonnia Dusky made aware.

## 2018-09-27 DIAGNOSIS — F259 Schizoaffective disorder, unspecified: Secondary | ICD-10-CM

## 2018-09-27 DIAGNOSIS — F1099 Alcohol use, unspecified with unspecified alcohol-induced disorder: Secondary | ICD-10-CM

## 2018-09-27 LAB — URINE CULTURE: Culture: 50000 — AB

## 2018-09-27 MED ORDER — CARIPRAZINE HCL 1.5 MG PO CAPS
1.5000 mg | ORAL_CAPSULE | Freq: Every day | ORAL | Status: DC
  Administered 2018-09-27 – 2018-09-28 (×2): 1.5 mg via ORAL
  Filled 2018-09-27 (×3): qty 1

## 2018-09-27 MED ORDER — LAMOTRIGINE 25 MG PO TABS
25.0000 mg | ORAL_TABLET | Freq: Every day | ORAL | Status: DC
  Administered 2018-09-27 – 2018-09-29 (×3): 25 mg via ORAL
  Filled 2018-09-27 (×5): qty 1

## 2018-09-27 NOTE — [facility] Group Notes (Signed)
  LCSW Group Therapy Note  09/27/2018   10:00-11:00am   Type of Therapy and Topic:  Group Therapy: Anger Cues and Responses  Participation Level:  Active   Description of Group:   In this group, patients learned how to recognize the physical, cognitive, emotional, and behavioral responses they have to anger-provoking situations.  They identified a recent time they became angry and how they reacted.  They analyzed how their reaction was possibly beneficial and how it was possibly unhelpful.  The group discussed a variety of healthier coping skills that could help with such a situation in the future.  Deep breathing was practiced briefly.  Therapeutic Goals: 1. Patients will remember their last incident of anger and how they felt emotionally and physically, what their thoughts were at the time, and how they behaved. 2. Patients will identify how their behavior at that time worked for them, as well as how it worked against them. 3. Patients will explore possible new behaviors to use in future anger situations. 4. Patients will learn that anger itself is normal and cannot be eliminated, and that healthier reactions can assist with resolving conflict rather than worsening situations.  Summary of Patient Progress:  The patient shared that her most recent time of anger was 3 weeks ago and said her reaction to being angry once more at mother was to block her mother and not even tell her mother where she is.  She was confused at various times during group, needed to have information repeated several times.  Therapeutic Modalities:   Cognitive Behavioral Therapy  Maretta Los

## 2018-09-27 NOTE — Plan of Care (Signed)
Patient self inventory- Patient slept well last night, appetite has been good, energy level low, concentration normal. Depression, hopelessness, and anxiety rated 5, 5, 6. Patient endorses pain in her back as well as her feet rated 5/10. Patient wanted to make sure staff knows that [birth year] is her birthday. Patient presents aware of her surroundings and why she's in the hospital, but still somewhat confused. Patient mentioned several times today she needs to get home because her son is going to cook her a birthday dinner, and she needs to let her cat out.  Patient is compliant with medications prescribed per provider. Safety is maintained with 15 minute checks as well as environmental checks. Will continue to monitor.   Problem: Activity: Goal: Interest or engagement in activities will improve Outcome: Progressing   Problem: Activity: Goal: Sleeping patterns will improve Outcome: Progressing   Problem: Coping: Goal: Ability to verbalize frustrations and anger appropriately will improve Outcome: Progressing
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
## 2018-09-28 ENCOUNTER — Telehealth: Payer: Self-pay

## 2018-09-28 LAB — BASIC METABOLIC PANEL
ANION GAP: 8 (ref 5–15)
BUN: 14 mg/dL (ref 6–20)
CO2: 29 mmol/L (ref 22–32)
Calcium: 9.5 mg/dL (ref 8.9–10.3)
Chloride: 105 mmol/L (ref 98–111)
Creatinine, Ser: 1.07 mg/dL — ABNORMAL HIGH (ref 0.44–1.00)
GFR calc Af Amer: >60 mL/min (ref >60)
GFR, EST NON AFRICAN AMERICAN: 58 mL/min — AB (ref >60)
GLUCOSE: 92 mg/dL (ref 70–99)
Potassium: 4.4 mmol/L (ref 3.5–5.1)
Sodium: 142 mmol/L (ref 135–145)

## 2018-09-28 LAB — LIPID PANEL
Cholesterol: 227 mg/dL — ABNORMAL HIGH (ref 0–200)
HDL: 90 mg/dL (ref >40)
LDL CALC: 127 mg/dL — AB (ref 0–99)
TRIGLYCERIDES: 48 mg/dL (ref <150)
Total CHOL/HDL Ratio: 2.5 RATIO
VLDL: 10 mg/dL (ref 0–40)

## 2018-09-28 LAB — TSH: TSH: 1.683 u[IU]/mL (ref 0.350–4.500)

## 2018-09-28 MED ORDER — CARIPRAZINE HCL 3 MG PO CAPS
3.0000 mg | ORAL_CAPSULE | Freq: Every day | ORAL | Status: DC
  Administered 2018-09-29 – 2018-09-30 (×2): 3 mg via ORAL
  Filled 2018-09-28 (×4): qty 1

## 2018-09-28 NOTE — B[facility] Group Notes (Signed)
[facility] [place] LCSW Group Therapy Note  09/28/2018  10:00-11:00AM  Type of Therapy and Topic:  Group Therapy:  Adding Supports Including Being Your Own Support  Participation Level:  Active   Description of Group:  Patients in this group were introduced to the concept that additional supports including self-support are an essential part of recovery.  A song entitled "I Need Help!" was played and a group discussion was held in reaction to the idea of needing to add supports.  A song entitled "My Own Hero" was played and a group discussion ensued in which patients stated they could relate to the song and it inspired them to realize they have be willing to help themselves in order to succeed, because other people cannot achieve sobriety or stability for them.  We discussed adding a variety of healthy supports to address the various needs in their lives.  A song was played called "I Know Where I've Been" toward the end of group and used to conduct an inspirational wrap-up to group of remembering how far they have already come in their journey.  Therapeutic Goals: 1)  demonstrate the importance of being a part of one's own support system 2)  discuss reasons people in one's life may eventually be unable to be continually supportive  3)  identify the patient's current support system and   4)  elicit commitments to add healthy supports and to become more conscious of being self-supportive   Summary of Patient Progress:  The patient expressed that her healthy supports include the peers in the group and her unhealthy support are her entire family.   Therapeutic Modalities:   Motivational Interviewing Activity  Maretta Los
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
## 2018-09-28 NOTE — Plan of Care (Signed)
  Problem: Education: Goal: Emotional status will improve Outcome: Not Progressing   Problem: Activity: Goal: Interest or engagement in activities will improve Outcome: Progressing   Problem: Safety: Goal: Periods of time without injury will increase Outcome: Progressing  DAR NOTE: Patient presents with anxious affect and mood lability.  Denies suicidal thoughts, auditory and visual hallucinations.  Continues to have and reports periods of mood swings.  MD made aware of complaints.  Ativan 1 mg given for complain of anxiety and agitation with good effect.  Rates depression at 0, hopelessness at 0, and anxiety at 8.  Maintained on routine safety checks.  Medications given as prescribed.  Support and encouragement offered as needed.  Attended group and participated.  States goal for today is "be able to go to meetings and plan to leave."  Patient became verbally aggressive toward staff when redirected about unit rules.  Patient was apologetic later but became upset about her behavior.
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
## 2018-09-28 NOTE — Telephone Encounter (Signed)
No treatment for [facility] ED 09/26/18 per Martinique Robinson PA

## 2018-09-29 LAB — PROLACTIN: Prolactin: 24.6 ng/mL — ABNORMAL HIGH (ref 4.8–23.3)

## 2018-09-29 LAB — HEMOGLOBIN A1C
Hgb A1c MFr Bld: 5.4 % (ref 4.8–5.6)
MEAN PLASMA GLUCOSE: 108 mg/dL

## 2018-09-29 MED ORDER — DIVALPROEX SODIUM ER 500 MG PO TB24
500.0000 mg | ORAL_TABLET | Freq: Every day | ORAL | Status: DC
  Administered 2018-09-29 – 2018-09-30 (×2): 500 mg via ORAL
  Filled 2018-09-29 (×4): qty 1

## 2018-09-29 MED ORDER — PHENYLEPH-SHARK LIV OIL-MO-PET 0.25-3-14-71.9 % RE OINT
TOPICAL_OINTMENT | Freq: Two times a day (BID) | RECTAL | Status: DC | PRN
  Filled 2018-09-29: qty 28.4

## 2018-09-29 NOTE — Progress Notes (Signed)
CSW spoke to [facility] ACT team--pt is not current and they do not show that she has been referred before.  She will fax referral form. Winferd Humphrey, MSW, LCSW Clinical Social Worker 09/29/2018 11:23 AM

## 2018-09-29 NOTE — Progress Notes (Signed)
Adult Psychoeducational Group Note  Date:  09/29/2018 Time:  8:52 PM  Group Topic/Focus:  Wrap-Up Group:   The focus of this group is to help patients review their daily goal of treatment and discuss progress on daily workbooks.  Participation Level:  Active  Participation Quality:  Appropriate  Affect:  Appropriate  Cognitive:  Appropriate  Insight: Appropriate  Engagement in Group:  Engaged  Modes of Intervention:  Discussion  Additional Comments: The patient rates her day a 10.The patient also attended all groups.  Nash Shearer 09/29/2018, 8:52 PM

## 2018-09-29 NOTE — Plan of Care (Signed)
  Problem: Education: Goal: Mental status will improve Outcome: Not Progressing   Problem: Safety: Goal: Periods of time without injury will increase Outcome: Progressing  DAR NOTE: Patient presents with anxious affect and irritable mood.  Denies suicidal thoughts, auditory and visual hallucinations.  Rates depression at 8, hopelessness at 8, and anxiety at 9.  Maintained on routine safety checks.  Medications given as prescribed.  Support and encouragement offered as needed.  Attended group and participated.  States goal for today is "take care of this mood swing."  Patient visible in the hallway and on the phone several times.  Visibly upset and agitated.  States she doesn't want to be around loud and talkative people.  Received Ativan 1 mg for anxiety and agitation with good effect.

## 2018-09-29 NOTE — [facility] Group Notes (Signed)
[facility] LCSW Group Therapy Note  Date/Time: 09/29/18, 1315  Type of Therapy and Topic:  Group Therapy:  Overcoming Obstacles  Participation Level:  active  Description of Group:    In this group patients will be encouraged to explore what they see as obstacles to their own wellness and recovery. They will be guided to discuss their thoughts, feelings, and behaviors related to these obstacles. The group will process together ways to cope with barriers, with attention given to specific choices patients can make. Each patient will be challenged to identify changes they are motivated to make in order to overcome their obstacles. This group will be process-oriented, with patients participating in exploration of their own experiences as well as giving and receiving support and challenge from other group members.  Therapeutic Goals: 1. Patient will identify personal and current obstacles as they relate to admission. 2. Patient will identify barriers that currently interfere with their wellness or overcoming obstacles.  3. Patient will identify feelings, thought process and behaviors related to these barriers. 4. Patient will identify two changes they are willing to make to overcome these obstacles:    Summary of Patient Progress: Pt shared that anxiety and being alone are current obstacles.  Pt was mostly quiet during group discussion regarding positive ways to overcome obstacles, but pt did respond to CSW questions. Pt became somewhat upset at the end of group and believed another pt was trying to listen in on her conversation with CSW.        Therapeutic Modalities:   Cognitive Behavioral Therapy Solution Focused Therapy Motivational Interviewing Relapse Prevention Therapy  Lurline Idol, LCSW

## 2018-09-29 NOTE — Progress Notes (Signed)
Pt did not attend wrap-up group   

## 2018-09-29 NOTE — Tx Team (Signed)
Interdisciplinary Treatment and Diagnostic Plan Update  09/29/2018 Time of Session:  Lauren Fuentes MRN: [id number]  Principal Diagnosis: Schizoaffective disorder [facility])  Secondary Diagnoses: Principal Problem:   Schizoaffective disorder ([place])   Current Medications:  Current Facility-Administered Medications  Medication Dose Route Frequency Provider Last Rate Last Dose  . acetaminophen (TYLENOL) tablet 650 mg  650 mg Oral Q6H PRN Money, Lowry Ram, FNP   650 mg at 09/28/18 2052  . alum & mag hydroxide-simeth (MAALOX/MYLANTA) 200-200-20 MG/5ML suspension 30 mL  30 mL Oral Q4H PRN Money, Lowry Ram, FNP   30 mL at 09/29/18 0819  . cariprazine (VRAYLAR) capsule 3 mg  3 mg Oral Daily Cobos, Myer Peer, MD   3 mg at 09/29/18 0815  . lamoTRIgine (LAMICTAL) tablet 25 mg  25 mg Oral Daily Cobos, Myer Peer, MD   25 mg at 09/29/18 0815  . LORazepam (ATIVAN) tablet 1 mg  1 mg Oral Q6H PRN Money, Lowry Ram, FNP   1 mg at 09/29/18 0819   Or  . LORazepam (ATIVAN) injection 1 mg  1 mg Intramuscular Q6H PRN Money, Darnelle Maffucci B, FNP      . magnesium hydroxide (MILK OF MAGNESIA) suspension 30 mL  30 mL Oral Daily PRN Money, Darnelle Maffucci B, FNP      . nicotine (NICODERM CQ - dosed in mg/24 hours) patch 21 mg  21 mg Transdermal Daily Pennelope Bracken, MD   21 mg at 09/29/18 0820   PTA Medications: Medications Prior to Admission  Medication Sig Dispense Refill Last Dose  . albuterol (PROVENTIL HFA;VENTOLIN HFA) 108 (90 Base) MCG/ACT inhaler Inhale 1-2 puffs into the lungs every 6 (six) hours as needed for wheezing or shortness of breath.   Past Week at Unknown time  . benztropine (COGENTIN) 1 MG tablet Take 1 mg by mouth 2 (two) times daily.  0   . budesonide-formoterol (SYMBICORT) 80-4.5 MCG/ACT inhaler Inhale 2 puffs into the lungs 2 (two) times daily.   09/21/2018  . busPIRone (BUSPAR) 10 MG tablet Take 10 mg by mouth 3 (three) times daily.    Past Week at Unknown time  . cariprazine (VRAYLAR) capsule Take 3 mg  by mouth daily.     . divalproex (DEPAKOTE ER) 500 MG 24 hr tablet Take 500 mg by mouth at bedtime.     . divalproex (DEPAKOTE) 500 MG DR tablet Take 500 mg by mouth 3 (three) times daily.     Marland Kitchen doxepin (SINEQUAN) 50 MG capsule Take 100 mg by mouth at bedtime.     Marland Kitchen FLUoxetine (PROZAC) 20 MG capsule Take 20 mg by mouth every morning.    09/21/2018  . fluticasone (FLONASE) 50 MCG/ACT nasal spray Place 2 sprays into both nostrils daily. 16 g 6 09/24/2018  . hydrOXYzine (ATARAX/VISTARIL) 50 MG tablet Take 25 mg by mouth every 6 (six) hours as needed for anxiety.    09/21/2018  . lamoTRIgine (LAMICTAL) 100 MG tablet Take 100 mg by mouth 2 (two) times daily.   Past Week at Unknown time  . lamoTRIgine (LAMICTAL) 150 MG tablet Take 150 mg by mouth 2 (two) times daily.     Marland Kitchen loratadine (CLARITIN) 10 MG tablet Take 10 mg by mouth daily.     Marland Kitchen loxapine (LOXITANE) 10 MG capsule Take 10 mg by mouth 3 (three) times daily.  3 09/21/2018  . montelukast (SINGULAIR) 10 MG tablet Take 10 mg by mouth daily. Daily at 9:00am   unknown  . ondansetron (ZOFRAN-ODT) 4 MG disintegrating  tablet Take 4 mg by mouth every 8 (eight) hours as needed for nausea or vomiting.     Marland Kitchen perphenazine (TRILAFON) 4 MG tablet Take 4 mg by mouth 3 (three) times daily.     . prazosin (MINIPRESS) 1 MG capsule Take 1 mg by mouth at bedtime.     . traZODone (DESYREL) 150 MG tablet Take 300 mg by mouth at bedtime.   Past Week at Unknown time  . triprolidine-pseudoephedrine (APRODINE) 2.5-60 MG TABS tablet Take 1 tablet by mouth every 4 (four) hours as needed for allergies or congestion.    Past Week at Unknown time  . valACYclovir (VALTREX) 500 MG tablet TAKE 1 TABLET BY MOUTH 2 TIMES DAILY. (Patient taking differently: Take 500 mg by mouth daily. ) 60 tablet 1 Past Week at Unknown time    Patient Stressors: Medication change or noncompliance Traumatic event  Patient Strengths: Capable of independent living Scientist, research (life sciences) Supportive  family/friends  Treatment Modalities: Medication Management, Group therapy, Case management,  1 to 1 session with clinician, Psychoeducation, Recreational therapy.   Physician Treatment Plan for Primary Diagnosis: Schizoaffective disorder ([place]) Long Term Goal(s): Improvement in symptoms so as ready for discharge Improvement in symptoms so as ready for discharge   Short Term Goals: Ability to identify changes in lifestyle to reduce recurrence of condition will improve Ability to verbalize feelings will improve Ability to identify and develop effective coping behaviors will improve Ability to maintain clinical measurements within normal limits will improve Ability to identify changes in lifestyle to reduce recurrence of condition will improve Ability to demonstrate self-control will improve Ability to identify and develop effective coping behaviors will improve Ability to identify triggers associated with substance abuse/mental health issues will improve  Medication Management: Evaluate patient's response, side effects, and tolerance of medication regimen.  Therapeutic Interventions: 1 to 1 sessions, Unit Group sessions and Medication administration.  Evaluation of Outcomes: Progressing  Physician Treatment Plan for Secondary Diagnosis: Principal Problem:   Schizoaffective disorder ([place])  Long Term Goal(s): Improvement in symptoms so as ready for discharge Improvement in symptoms so as ready for discharge   Short Term Goals: Ability to identify changes in lifestyle to reduce recurrence of condition will improve Ability to verbalize feelings will improve Ability to identify and develop effective coping behaviors will improve Ability to maintain clinical measurements within normal limits will improve Ability to identify changes in lifestyle to reduce recurrence of condition will improve Ability to demonstrate self-control will improve Ability to identify and develop effective coping  behaviors will improve Ability to identify triggers associated with substance abuse/mental health issues will improve     Medication Management: Evaluate patient's response, side effects, and tolerance of medication regimen.  Therapeutic Interventions: 1 to 1 sessions, Unit Group sessions and Medication administration.  Evaluation of Outcomes: Progressing   RN Treatment Plan for Primary Diagnosis: Schizoaffective disorder ([place]) Long Term Goal(s): Knowledge of disease and therapeutic regimen to maintain health will improve  Short Term Goals: Ability to participate in decision making will improve, Ability to verbalize feelings will improve, Ability to identify and develop effective coping behaviors will improve and Compliance with prescribed medications will improve  Medication Management: RN will administer medications as ordered by provider, will assess and evaluate patient's response and provide education to patient for prescribed medication. RN will report any adverse and/or side effects to prescribing provider.  Therapeutic Interventions: 1 on 1 counseling sessions, Psychoeducation, Medication administration, Evaluate responses to treatment, Monitor vital signs and CBGs as ordered,  Perform/monitor CIWA, COWS, AIMS and Fall Risk screenings as ordered, Perform wound care treatments as ordered.  Evaluation of Outcomes: Progressing   LCSW Treatment Plan for Primary Diagnosis: Schizoaffective disorder ([place]) Long Term Goal(s): Safe transition to appropriate next level of care at discharge, Engage patient in therapeutic group addressing interpersonal concerns.  Short Term Goals: Engage patient in aftercare planning with referrals and resources  Therapeutic Interventions: Assess for all discharge needs, 1 to 1 time with Social worker, Explore available resources and support systems, Assess for adequacy in community support network, Educate family and significant other(s) on suicide prevention,  Complete Psychosocial Assessment, Interpersonal group therapy.  Evaluation of Outcomes: Progressing   Progress in Treatment: Attending groups: Yes. Participating in groups: Yes. Taking medication as prescribed: Yes. Toleration medication: Yes. Family/Significant other contact made: No, will contact:  if patient consents to collateral contacts  Patient understands diagnosis: Yes. Discussing patient identified problems/goals with staff: Yes. Medical problems stabilized or resolved: Yes. Denies suicidal/homicidal ideation: Yes. Issues/concerns per patient self-inventory: No. Other:   New problem(s) identified: None   New Short Term/Long Term Goal(s): medication stabilization, elimination of SI thoughts, development of comprehensive mental wellness plan.   Patient Goals:    Discharge Plan or Barriers: Patient currently sees Dr[facility] Hoyle [facility] at [facility] for outpatient medication management. Patient currently being referred to ACTT services.   Reason for Continuation of Hospitalization: Anxiety Delusions  Medication stabilization  Estimated Length of Stay: 3-5 days   Attendees: Patient: 09/29/2018 8:36 AM  Physician: Dr. Neita Garnet, MD 09/29/2018 8:36 AM  Nursing: Benjamine Mola.Jenetta Downer RN 09/29/2018 8:36 AM  RN Care Manager: Lars Pinks, RN 09/29/2018 8:36 AM  Social Worker: Radonna Ricker, [place] 09/29/2018 8:36 AM  Recreational Therapist: Rhunette Croft 09/29/2018 8:36 AM  Other: Ricky Ala, NP 09/29/2018 8:36 AM  Other:  09/29/2018 8:36 AM  Other: 09/29/2018 8:36 AM    Scribe for Treatment Team: Marylee Floras, [place] 09/29/2018 8:36 AM

## 2018-09-29 NOTE — Progress Notes (Signed)
Pt was observed in the dayroom, seen eating a snack. Pt appears animated/anxious in affect and mood.Pt denies SI/HI/Pain at this time. Endorses AVH stating she sees dead people and hear good and bad voices talking to her.Not command in nature. Pt spends the majority of the day in her room. Pt states she doesn't like large groups of people and loud noises.PRN ativan requsted and given.Support and encouragement offered. Will continue with POC.

## 2018-09-29 NOTE — Progress Notes (Signed)
Writer spoke with patient 1:1 and she reports that she slept most of the day and must have needed it because she felt better. She is still concerned with her cat at home and no one to feed it. She still reports that she wants to stay here at the hospital. She reports not being able to remember things and becoming confused. She requested medications early so she could lie down. Safety maintained on unit with 15 min checks.

## 2018-09-29 NOTE — Progress Notes (Addendum)
[facility] MD Progress Note  09/29/2018 1:24 PM Lauren Fuentes  MRN:  [id number] Subjective:  Patient reports anxiety, mood lability, vague irritability. Continues to ruminate, although to lesser degree , about someone breaking into her home, threatening her. Denies medication side effects. Denies suicidal ideations.  Objective : I have discussed case with treatment team and  and have met with patient. 52 year old single female, lives alone, has two adult sons. Presented to ED via police, after calling [phone] reporting there was pounding on the walls , a break in in progress at her home, and that her life was in danger. In ED was tangential, endorsed auditory hallucinations.  She has a history of mental illness and has been diagnosed with Bipolar Disorder and Schizoaffective Disorder in the past. Home medications were reviewed- she was being prescribed multiple psychiatric medications but described fair compliance and was confused/had difficulty reporting which ones she had actually been taking .  Patient presents with some improvement , but remains anxious, labile, needing frequent reassurance, support. Staff reports she has been intermittently irritable, although redirectable.  Reports intermittent auditory hallucinations, but does not appear internally preoccupied at this time.  Denies medication side effects thus far .     Principal Problem: Schizoaffective disorder ([place]) Diagnosis:   Patient Active Problem List   Diagnosis Date Noted  . Chronic venous insufficiency [I87.2] 03/09/2016  . Hepatitis C [B19.20] 03/05/2016  . 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 Chronic hepatitis C virus infection ([place]) [B18.2] 12/15/2015  . Allergic rhinitis [J30.9] 12/09/2015  . Insomnia [G47.00] 12/09/2015  . Genital herpes [A60.00] 12/09/2015  . Hyperlipidemia [E78.5] 12/09/2015  . Menopausal symptom [N95.1] 12/09/2015  . Compression fracture of body of thoracic vertebra  ([place]) [S22.000A] 02/17/2015  . Lumbar compression fracture, L1-2 endplate  [N62.952W] 41/32/4401  . Thoracic compression fracture, T12 endplate  [U27.253G] 64/40/3474  . Fracture of metatarsal bone of right foot, 4th neck [S92.301A] 10/22/2014  . Has jumped from building [W13.9XXA] 10/22/2014  . Bipolar 1 disorder ([place]) [F31.9]   . Schizoaffective disorder ([place]) [F25.9]   . Calcaneal fracture, Left  [S92.009A] 10/19/2014   Total Time spent with patient: 20 minutes  Past Psychiatric History:   Past Medical History:  Past Medical History:  Diagnosis Date  . Arthritis   . Back pain, chronic   . Bipolar 1 disorder ()   . Calcaneal fracture, Left  10/19/2014  . Fracture of metatarsal bone of right foot, 4th neck 10/22/2014  . GERD (gastroesophageal reflux disease)   . Headache    hx migraines, tension headaches  . Hepatitis    stated in the past - pt denies this as of 07/28/15)  . Lumbar compression fracture, L1-2 endplate  25/95/6387  . Post traumatic stress disorder (PTSD)   . Schizoaffective disorder ([place])   . Thoracic compression fracture, T12 endplate  56/43/3295    Past Surgical History:  Procedure Laterality Date  . COLONOSCOPY    . HARDWARE REMOVAL Left 07/29/2015   Procedure: HARDWARE REMOVAL,LEFT HEEL;  Surgeon: Altamese Massillon, MD;  Location: [place];  Service: Orthopedics;  Laterality: Left;  . HEMORRHOID SURGERY    . KYPHOPLASTY N/A 02/17/2015   Procedure: T12 KYPHOPLASTY;  Surgeon: Melina Schools, MD;  Location: [place];  Service: Orthopedics;  Laterality: N/A;  . ORIF CALCANEOUS FRACTURE Left 10/21/2014   Procedure: OPEN REDUCTION INTERNAL FIXATION (ORIF) LEFT CALCANEOUS FRACTURE;  Surgeon: Rozanna Box, MD;  Location: [place];  Service: Orthopedics;  Laterality:  Left;  . STERIOD INJECTION Right 07/29/2015   Procedure: Subtalar Joint injection with Fluoro guidance ;  Surgeon: Altamese Eaton, MD;  Location: [place];  Service: Orthopedics;  Laterality: Right;  . TUBAL LIGATION      Family History:  Family History  Problem Relation Age of Onset  . Hypertension Mother   . Bipolar disorder Mother   . Hypertension Father   . Cancer Father    Family Psychiatric  History:  Social History:  Social History   Substance and Sexual Activity  Alcohol Use Yes  . Alcohol/week: 0.0 standard drinks   Comment: rare     Social History   Substance and Sexual Activity  Drug Use Yes  . Types: Cocaine   Comment: last use 09/2014     Social History   Socioeconomic History  . Marital status: Divorced    Spouse name: Not on file  . Number of children: Not on file  . Years of education: Not on file  . Highest education level: Not on file  Occupational History  . Not on file  Social Needs  . Financial resource strain: Not on file  . Food insecurity:    Worry: Not on file    Inability: Not on file  . Transportation needs:    Medical: Not on file    Non-medical: Not on file  Tobacco Use  . Smoking status: Current Some Day Smoker    Packs/day: 0.25    Years: 18.00    Pack years: 4.50    Types: Cigarettes    Last attempt to quit: 04/27/2015    Years since quitting: 3.4  . Smokeless tobacco: Never Used  Substance and Sexual Activity  . Alcohol use: Yes    Alcohol/week: 0.0 standard drinks    Comment: rare  . Drug use: Yes    Types: Cocaine    Comment: last use 09/2014   . Sexual activity: Not on file  Lifestyle  . Physical activity:    Days per week: Not on file    Minutes per session: Not on file  . Stress: Not on file  Relationships  . Social connections:    Talks on phone: Not on file    Gets together: Not on file    Attends religious service: Not on file    Active member of club or organization: Not on file    Attends meetings of clubs or organizations: Not on file    Relationship status: Not on file  Other Topics Concern  . Not on file  Social History Narrative   ** Merged History Encounter **       Additional Social History:   Sleep:  Fair  Appetite:  Good  Current Medications: Current Facility-Administered Medications  Medication Dose Route Frequency Provider Last Rate Last Dose  . acetaminophen (TYLENOL) tablet 650 mg  650 mg Oral Q6H PRN Money, Lowry Ram, FNP   650 mg at 09/28/18 2052  . alum & mag hydroxide-simeth (MAALOX/MYLANTA) 200-200-20 MG/5ML suspension 30 mL  30 mL Oral Q4H PRN Money, Lowry Ram, FNP   30 mL at 09/29/18 0819  . cariprazine (VRAYLAR) capsule 3 mg  3 mg Oral Daily Nela Bascom, Myer Peer, MD   3 mg at 09/29/18 0815  . lamoTRIgine (LAMICTAL) tablet 25 mg  25 mg Oral Daily Klara Stjames, Myer Peer, MD   25 mg at 09/29/18 0815  . LORazepam (ATIVAN) tablet 1 mg  1 mg Oral Q6H PRN Money, Lowry Ram, FNP   1  mg at 09/29/18 0819   Or  . LORazepam (ATIVAN) injection 1 mg  1 mg Intramuscular Q6H PRN Money, Darnelle Maffucci B, FNP      . magnesium hydroxide (MILK OF MAGNESIA) suspension 30 mL  30 mL Oral Daily PRN Money, Darnelle Maffucci B, FNP      . nicotine (NICODERM CQ - dosed in mg/24 hours) patch 21 mg  21 mg Transdermal Daily Pennelope Bracken, MD   21 mg at 09/29/18 0820  . phenylephrine-shark liver oil-mineral oil-petrolatum (PREPARATION H) rectal ointment   Rectal BID PRN Derrill Center, NP        Lab Results:  Results for orders placed or performed during the hospital encounter of 09/26/18 (from the past 48 hour(s))  TSH     Status: None   Collection Time: 09/28/18  6:42 AM  Result Value Ref Range   TSH 1.683 0.350 - 4.500 uIU/mL    Comment: Performed by a 3rd Generation assay with a functional sensitivity of <=0.01 uIU/mL. Performed at [facility], [place] [address], [place], [place] [address]   Prolactin     Status: Abnormal   Collection Time: 09/28/18  6:42 AM  Result Value Ref Range   Prolactin 24.6 (H) 4.8 - 23.3 ng/mL    Comment: (NOTE) Performed At: [facility] [place], [place] [id number] Rush Farmer MD [id number]   Hemoglobin A1c     Status: None    Collection Time: 09/28/18  6:42 AM  Result Value Ref Range   Hgb A1c MFr Bld 5.4 4.8 - 5.6 %    Comment: (NOTE)         Prediabetes: 5.7 - 6.4         Diabetes: >6.4         Glycemic control for adults with diabetes: <7.0    Mean Plasma Glucose 108 mg/dL    Comment: (NOTE) Performed At: [facility] [place], [place] [id number] Rush Farmer MD [id number]   Lipid panel     Status: Abnormal   Collection Time: 09/28/18  6:42 AM  Result Value Ref Range   Cholesterol 227 (H) 0 - 200 mg/dL    Comment:        ATP III CLASSIFICATION:  <200     mg/dL   Desirable  200-239  mg/dL   Borderline High  >=240    mg/dL   High           Triglycerides 48 <150 mg/dL   HDL 90 >40 mg/dL   Total CHOL/HDL Ratio 2.5 RATIO   VLDL 10 0 - 40 mg/dL   LDL Cholesterol 127 (H) 0 - 99 mg/dL    Comment:        Total Cholesterol/HDL:CHD Risk Coronary Heart Disease Risk Table                     Men   Women  1/2 Average Risk   3.4   3.3  Average Risk       5.0   4.4  2 X Average Risk   9.6   7.1  3 X Average Risk  23.4   11.0        Use the calculated Patient Ratio above and the CHD Risk Table to determine the patient's CHD Risk.        ATP III CLASSIFICATION (LDL):  <100     mg/dL   Optimal  100-129  mg/dL   Near or  Above                    Optimal  130-159  mg/dL   Borderline  160-189  mg/dL   High  >190     mg/dL   Very High Performed at [facility] [address] [place], [place] [address]   Basic metabolic panel     Status: Abnormal   Collection Time: 09/28/18  6:42 AM  Result Value Ref Range   Sodium 142 135 - 145 mmol/L   Potassium 4.4 3.5 - 5.1 mmol/L   Chloride 105 98 - 111 mmol/L   CO2 29 22 - 32 mmol/L   Glucose, Bld 92 70 - 99 mg/dL   BUN 14 6 - 20 mg/dL   Creatinine, Ser 1.07 (H) 0.44 - 1.00 mg/dL   Calcium 9.5 8.9 - 10.3 mg/dL   GFR calc non Af Amer 58 (L) >60 mL/min   GFR calc Af Amer >60 >60 mL/min    Comment: (NOTE) The  eGFR has been calculated using the CKD EPI equation. This calculation has not been validated in all clinical situations. eGFR's persistently <60 mL/min signify possible Chronic Kidney Disease.    Anion gap 8 5 - 15    Comment: Performed at [facility], [place] [address], [place],  [address]    Blood Alcohol level:  Lab Results  Component Value Date   [facility] <10 09/26/2018   ETH <10 03/50/0938    Metabolic Disorder Labs: Lab Results  Component Value Date   HGBA1C 5.4 09/28/2018   MPG 108 09/28/2018   Lab Results  Component Value Date   PROLACTIN 24.6 (H) 09/28/2018   Lab Results  Component Value Date   CHOL 227 (H) 09/28/2018   TRIG 48 09/28/2018   HDL 90 09/28/2018   CHOLHDL 2.5 09/28/2018   VLDL 10 09/28/2018   LDLCALC 127 (H) 09/28/2018   LDLCALC 61 01/13/2016    Physical Findings: AIMS: Facial and Oral Movements Muscles of Facial Expression: None, normal Lips and Perioral Area: None, normal Jaw: None, normal Tongue: None, normal,Extremity Movements Upper (arms, wrists, hands, fingers): None, normal Lower (legs, knees, ankles, toes): None, normal, Trunk Movements Neck, shoulders, hips: None, normal, Overall Severity Severity of abnormal movements (highest score from questions above): None, normal Incapacitation due to abnormal movements: None, normal Patient's awareness of abnormal movements (rate only patient's report): No Awareness, Dental Status Current problems with teeth and/or dentures?: No Does patient usually wear dentures?: No  CIWA:    COWS:     Musculoskeletal: Strength & Muscle Tone: within normal limits Gait & Station: normal Patient leans: N/A  Psychiatric Specialty Exam: Physical Exam  ROS denies chest pain, no shortness of breath, no vomiting   Blood pressure 113/74, pulse 100, temperature 98.3 F (36.8 C), temperature source Oral, resp. rate 18, height 5' 4"  (1.626 m), weight 91.6 kg, last menstrual period  12/13/2015, SpO2 99 %.Body mass index is 34.67 kg/m.  General Appearance: improving grooming  Eye Contact:  Good  Speech:  Normal Rate  Volume:  Normal  Mood:  describes mood lability, states " my mood is up and down"  Affect:  reactive, labile, briefly tearful during session  Thought Process:  More goal directed today but tangentiality still noted   Orientation:  Other:  fully alert and attentive  Thought Content:  endorses intermittent auditory hallucinations, not currently internally preoccupied   Suicidal Thoughts:  No denies suicidal or self injurious ideations,  no homicidal or violent ideations  Homicidal Thoughts:  No  Memory:  recent and remote fair   Judgement:  Fair  Insight:  Fair  Psychomotor Activity:  Normal-  No psychomotor agitation  Concentration:  Concentration: Fair and Attention Span: Fair  Recall:  [employer] of Knowledge:  Good  Language:  Good  Akathisia:  Negative  Handed:  Right  AIMS (if indicated):     Assets:  Desire for Improvement Resilience  ADL's:  Intact  Cognition:  WNL  Sleep:  Number of Hours: 5   Assessment - 52 year old single female, lives alone, has two adult sons. Presented to ED via police, after calling [phone] reporting there was pounding on the walls , a break in in progress at her home, and that her life was in danger. In ED was tangential, endorsed auditory hallucinations.  She has a history of mental illness and has been diagnosed with Bipolar Disorder and Schizoaffective Disorder in the past. Home medications were reviewed- reported  multiple psychiatric medications but described fair compliance and had difficulty reporting which ones she had actually been taking .   Patient presents with some improvement compared to admission, but describes  persistent mood lability, anxiety, and remains ruminative. Endorses intermittent , apparently chronic, auditory hallucinations, but not internally preoccupied at this time. Denies medication side  effects at this time. We have again reviewed medications she was taking prior to admission. Presents confused and unsure about which ones she had been taking, but states she had been missing doses often. Agrees with goal of trying to simplify her medication regimen as much as possible. Does remember having been prescribed Depakote, does not remember having had side effects other than some sedation,and thinks it did help. She is unsure when she last took it , but apparently not recently. Will discontinue Lamictal (to minimize number of medications/simplify regimen, decrease risk of drug drug interactions with Depakote ER)    Treatment Plan Summary: Daily contact with patient to assess and evaluate symptoms and progress in treatment, Medication management, Plan inpatient treatment  and medications as below Encourage group and milieu participation to work on coping skills and symptom reduction Continue Vraylar to 3 mgrs QDAY for mood disorder, psychosis D/C Lamictal  Start Depakote ER 500 mgrs QHS for mood disorder  Continue Ativan 1 mgr Q 6 hours PRN for anxiety as needed  Continue Nicoderm patch to minimize Nicotine WDL symptoms Treatment team working on disposition planning- as discussed with CSW, based on history of chronic mental illness, frequent psychiatric admissions, and reported difficulties with medication management/complaince, ACT referral may be an appropriate disposition option.  Jenne Campus, MD 09/29/2018, 1:24 PM   Patient ID: Lauren Fuentes, female   DOB: 08-26-1966, 52 y.o.   MRN: [id number]

## 2018-09-29 NOTE — [facility] Group Notes (Signed)
[facility] Group Notes:  (Nursing/MHT/Case Management/Adjunct)  Date:  09/29/2018  Time:  4:30 pm  Type of Therapy:  Psychoeducational Skills  Participation Level:  Did Not Attend  Participation Quality:    Affect:    Cognitive:    Insight:    Engagement in Group:    Modes of Intervention:    Summary of Progress/Problems:  Lauren Fuentes 09/29/2018, 6:04 PM

## 2018-09-30 DIAGNOSIS — I10 Essential (primary) hypertension: Secondary | ICD-10-CM

## 2018-09-30 DIAGNOSIS — F250 Schizoaffective disorder, bipolar type: Principal | ICD-10-CM

## 2018-09-30 MED ORDER — ALBUTEROL SULFATE HFA 108 (90 BASE) MCG/ACT IN AERS
2.0000 | INHALATION_SPRAY | Freq: Four times a day (QID) | RESPIRATORY_TRACT | Status: DC | PRN
  Administered 2018-09-30 – 2018-10-02 (×2): 2 via RESPIRATORY_TRACT
  Filled 2018-09-30: qty 6.7

## 2018-09-30 MED ORDER — CARIPRAZINE HCL 1.5 MG PO CAPS
4.5000 mg | ORAL_CAPSULE | Freq: Every day | ORAL | Status: DC
  Administered 2018-10-01: 4.5 mg via ORAL
  Filled 2018-09-30 (×3): qty 3

## 2018-09-30 MED ORDER — DIVALPROEX SODIUM ER 250 MG PO TB24
750.0000 mg | ORAL_TABLET | Freq: Every day | ORAL | Status: DC
  Administered 2018-10-01: 750 mg via ORAL
  Filled 2018-09-30 (×3): qty 3

## 2018-09-30 MED ORDER — CARIPRAZINE HCL 1.5 MG PO CAPS
1.5000 mg | ORAL_CAPSULE | Freq: Every day | ORAL | Status: AC
  Administered 2018-09-30: 1.5 mg via ORAL
  Filled 2018-09-30: qty 1

## 2018-09-30 MED ORDER — FLUTICASONE PROPIONATE HFA 44 MCG/ACT IN AERO
2.0000 | INHALATION_SPRAY | Freq: Two times a day (BID) | RESPIRATORY_TRACT | Status: DC
  Administered 2018-09-30 – 2018-10-03 (×8): 2 via RESPIRATORY_TRACT
  Filled 2018-09-30: qty 10.6

## 2018-09-30 NOTE — Progress Notes (Signed)
Pt was observed in the dayroom, seen eating a snack. Pt appears animated/anxious in affect and mood.Pt denies SI/HI/Pain at this time. Endorses AVH stating she sees dead people and hear good and bad voices talking to her. Pt states she does not want to go to [facility] and became upset. PRN ativan requsted and given.Support and encouragement offered. Will continue with POC.

## 2018-09-30 NOTE — Progress Notes (Signed)
[facility] MD Progress Note  09/30/2018 2:03 PM Lauren Fuentes  MRN:  [id number] Subjective: Patient is seen and examined.  Patient is a 52 year old female with a past psychiatric history significant for schizoaffective disorder.  She is seen in follow-up.  She is still significantly labile.  She is also pressured.  She remains hearing auditory hallucinations.  She also is having thought blocking.  It is thought that these 2 things are associated.  She goes from being a little bit pressured into tearful and agitated within seconds.  She stated she still very confused about her medications.  Notes previously if stated that they tried to explain her medications to her.  She also is focused on getting a new doctor, a therapist, and not gaining weight on her medications.  She also stated that she had been sober from substances for the last 4 years. Principal Problem: Schizoaffective disorder ([place]) Diagnosis:   Patient Active Problem List   Diagnosis Date Noted  . Chronic venous insufficiency [I87.2] 03/09/2016  . Hepatitis C [B19.20] 03/05/2016  . Varicose veins of lower extremities with complications [W09.811] 91/47/8295  . Chronic hepatitis C virus infection ([place]) [B18.2] 12/15/2015  . Allergic rhinitis [J30.9] 12/09/2015  . Insomnia [G47.00] 12/09/2015  . Genital herpes [A60.00] 12/09/2015  . Hyperlipidemia [E78.5] 12/09/2015  . Menopausal symptom [N95.1] 12/09/2015  . Compression fracture of body of thoracic vertebra ([place]) [S22.000A] 02/17/2015  . Lumbar compression fracture, L1-2 endplate  [A21.308M] 57/84/6962  . Thoracic compression fracture, T12 endplate  [X52.841L] 24/40/1027  . Fracture of metatarsal bone of right foot, 4th neck [S92.301A] 10/22/2014  . Has jumped from building [W13.9XXA] 10/22/2014  . Bipolar 1 disorder ([place]) [F31.9]   . Schizoaffective disorder ([place]) [F25.9]   . Calcaneal fracture, Left  [S92.009A] 10/19/2014   Total Time spent with patient: 30 minutes  Past Psychiatric  History: See admission H&P  Past Medical History:  Past Medical History:  Diagnosis Date  . Arthritis   . Back pain, chronic   . Bipolar 1 disorder ([place])   . Calcaneal fracture, Left  10/19/2014  . Fracture of metatarsal bone of right foot, 4th neck 10/22/2014  . GERD (gastroesophageal reflux disease)   . Headache    hx migraines, tension headaches  . Hepatitis    stated in the past - pt denies this as of 07/28/15)  . Lumbar compression fracture, L1-2 endplate  25/36/6440  . Post traumatic stress disorder (PTSD)   . Schizoaffective disorder ([place])   . Thoracic compression fracture, T12 endplate  34/74/2595    Past Surgical History:  Procedure Laterality Date  . COLONOSCOPY    . HARDWARE REMOVAL Left 07/29/2015   Procedure: HARDWARE REMOVAL,LEFT HEEL;  Surgeon: Altamese [place], MD;  Location: [place];  Service: Orthopedics;  Laterality: Left;  . HEMORRHOID SURGERY    . KYPHOPLASTY N/A 02/17/2015   Procedure: T12 KYPHOPLASTY;  Surgeon: Melina Schools, MD;  Location: [place];  Service: Orthopedics;  Laterality: N/A;  . ORIF CALCANEOUS FRACTURE Left 10/21/2014   Procedure: OPEN REDUCTION INTERNAL FIXATION (ORIF) LEFT CALCANEOUS FRACTURE;  Surgeon: Rozanna Box, MD;  Location: [place];  Service: Orthopedics;  Laterality: Left;  . STERIOD INJECTION Right 07/29/2015   Procedure: Subtalar Joint injection with Fluoro guidance ;  Surgeon: Altamese [place], MD;  Location: [place];  Service: Orthopedics;  Laterality: Right;  . TUBAL LIGATION     Family History:  Family History  Problem Relation Age of Onset  . Hypertension Mother   . Bipolar disorder  Mother   . Hypertension Father   . Cancer Father    Family Psychiatric  History: See admission H&P Social History:  Social History   Substance and Sexual Activity  Alcohol Use Yes  . Alcohol/week: 0.0 standard drinks   Comment: rare     Social History   Substance and Sexual Activity  Drug Use Yes  . Types: Cocaine   Comment: last use 09/2014      Social History   Socioeconomic History  . Marital status: Divorced    Spouse name: Not on file  . Number of children: Not on file  . Years of education: Not on file  . Highest education level: Not on file  Occupational History  . Not on file  Social Needs  . Financial resource strain: Not on file  . Food insecurity:    Worry: Not on file    Inability: Not on file  . Transportation needs:    Medical: Not on file    Non-medical: Not on file  Tobacco Use  . Smoking status: Current Some Day Smoker    Packs/day: 0.25    Years: 18.00    Pack years: 4.50    Types: Cigarettes    Last attempt to quit: 04/27/2015    Years since quitting: 3.4  . Smokeless tobacco: Never Used  Substance and Sexual Activity  . Alcohol use: Yes    Alcohol/week: 0.0 standard drinks    Comment: rare  . Drug use: Yes    Types: Cocaine    Comment: last use 09/2014   . Sexual activity: Not on file  Lifestyle  . Physical activity:    Days per week: Not on file    Minutes per session: Not on file  . Stress: Not on file  Relationships  . Social connections:    Talks on phone: Not on file    Gets together: Not on file    Attends religious service: Not on file    Active member of club or organization: Not on file    Attends meetings of clubs or organizations: Not on file    Relationship status: Not on file  Other Topics Concern  . Not on file  Social History Narrative   ** Merged History Encounter **       Additional Social History:                         Sleep: Good  Appetite:  Good  Current Medications: Current Facility-Administered Medications  Medication Dose Route Frequency Provider Last Rate Last Dose  . acetaminophen (TYLENOL) tablet 650 mg  650 mg Oral Q6H PRN Money, Lowry Ram, FNP   650 mg at 09/28/18 2052  . albuterol (PROVENTIL HFA;VENTOLIN HFA) 108 (90 Base) MCG/ACT inhaler 2 puff  2 puff Inhalation Q6H PRN Sharma Covert, MD   2 puff at 09/30/18 1133  . alum & mag  hydroxide-simeth (MAALOX/MYLANTA) 200-200-20 MG/5ML suspension 30 mL  30 mL Oral Q4H PRN Money, Darnelle Maffucci B, FNP   30 mL at 09/30/18 1316  . [START ON 10/01/2018] cariprazine (VRAYLAR) capsule 4.5 mg  4.5 mg Oral Daily Sharma Covert, MD      . Derrill Memo ON 10/01/2018] divalproex (DEPAKOTE ER) 24 hr tablet 750 mg  750 mg Oral Daily Sharma Covert, MD      . fluticasone (FLOVENT HFA) 44 MCG/ACT inhaler 2 puff  2 puff Inhalation BID Sharma Covert, MD   2 puff  at 09/30/18 1131  . LORazepam (ATIVAN) tablet 1 mg  1 mg Oral Q6H PRN Money, Lowry Ram, FNP   1 mg at 09/30/18 1141   Or  . LORazepam (ATIVAN) injection 1 mg  1 mg Intramuscular Q6H PRN Money, Darnelle Maffucci B, FNP      . magnesium hydroxide (MILK OF MAGNESIA) suspension 30 mL  30 mL Oral Daily PRN Money, Darnelle Maffucci B, FNP      . nicotine (NICODERM CQ - dosed in mg/24 hours) patch 21 mg  21 mg Transdermal Daily Pennelope Bracken, MD   21 mg at 09/30/18 0748  . phenylephrine-shark liver oil-mineral oil-petrolatum (PREPARATION H) rectal ointment   Rectal BID PRN Derrill Center, NP        Lab Results: No results found for this or any previous visit (from the past 36 hour(s)).  Blood Alcohol level:  Lab Results  Component Value Date   ETH <10 09/26/2018   ETH <10 02/63/7858    Metabolic Disorder Labs: Lab Results  Component Value Date   HGBA1C 5.4 09/28/2018   MPG 108 09/28/2018   Lab Results  Component Value Date   PROLACTIN 24.6 (H) 09/28/2018   Lab Results  Component Value Date   CHOL 227 (H) 09/28/2018   TRIG 48 09/28/2018   HDL 90 09/28/2018   CHOLHDL 2.5 09/28/2018   VLDL 10 09/28/2018   LDLCALC 127 (H) 09/28/2018   LDLCALC 61 01/13/2016    Physical Findings: AIMS: Facial and Oral Movements Muscles of Facial Expression: None, normal Lips and Perioral Area: None, normal Jaw: None, normal Tongue: None, normal,Extremity Movements Upper (arms, wrists, hands, fingers): None, normal Lower (legs, knees, ankles, toes):  None, normal, Trunk Movements Neck, shoulders, hips: None, normal, Overall Severity Severity of abnormal movements (highest score from questions above): None, normal Incapacitation due to abnormal movements: None, normal Patient's awareness of abnormal movements (rate only patient's report): No Awareness, Dental Status Current problems with teeth and/or dentures?: No Does patient usually wear dentures?: No  CIWA:    COWS:     Musculoskeletal: Strength & Muscle Tone: within normal limits Gait & Station: normal Patient leans: N/A  Psychiatric Specialty Exam: Physical Exam  Nursing note and vitals reviewed. Constitutional: She is oriented to person, place, and time. She appears well-developed and well-nourished.  HENT:  Head: Normocephalic and atraumatic.  Respiratory: Effort normal.  Neurological: She is alert and oriented to person, place, and time.    ROS  Blood pressure (!) 122/107, pulse 100, temperature 98.2 F (36.8 C), resp. rate 18, height 5\' 4"  (1.626 m), weight 91.6 kg, last menstrual period 12/13/2015, SpO2 99 %.Body mass index is 34.67 kg/m.  General Appearance: Casual  Eye Contact:  Good  Speech:  Pressured  Volume:  Increased  Mood:  Irritable and Labile  Affect:  Labile  Thought Process:  Disorganized and Descriptions of Associations: Tangential  Orientation:  Full (Time, Place, and Person)  Thought Content:  Hallucinations: Auditory Visual  Suicidal Thoughts:  No  Homicidal Thoughts:  No  Memory:  Immediate;   Poor Recent;   Poor Remote;   Poor  Judgement:  Impaired  Insight:  Lacking  Psychomotor Activity:  Increased  Concentration:  Concentration: Poor and Attention Span: Poor  Recall:  Poor  Fund of Knowledge:  Fair  Language:  Fair  Akathisia:  Negative  Handed:  Right  AIMS (if indicated):     Assets:  Desire for Improvement Housing Physical Health  ADL's:  Intact  Cognition:  WNL  Sleep:  Number of Hours: 6.75     Treatment Plan  Summary: Daily contact with patient to assess and evaluate symptoms and progress in treatment, Medication management and Plan : Patient is seen and examined.  Patient is a 52 year old female with a past psychiatric history as stated above.  #1 schizoaffective disorder; bipolar type-patient remains significantly psychotic and labile.  I am going to increase her Vraylar to 4.5 mg p.o. daily starting tomorrow.  I am also going to increase her Depakote ER to 750 mg p.o. nightly for mood stability.  She will continue to have lorazepam as needed for increased agitation.  #2 hypertension-currently stable.  #3 disposition-disposition planning in process.  #4 transfer patient back to [address] hallway for more appropriate treatment.  Sharma Covert, MD 09/30/2018, 2:03 PM

## 2018-09-30 NOTE — BHH Group Notes (Signed)
LCSW Group Therapy Note 09/30/2018 10:33 AM  Type of Therapy/Topic: Group Therapy: Feelings about Diagnosis  Participation Level: Did Not Attend   Description of Group:  This group will allow patients to explore their thoughts and feelings about diagnoses they have received. Patients will be guided to explore their level of understanding and acceptance of these diagnoses. Facilitator will encourage patients to process their thoughts and feelings about the reactions of others to their diagnosis and will guide patients in identifying ways to discuss their diagnosis with significant others in their lives. This group will be process-oriented, with patients participating in exploration of their own experiences, giving and receiving support, and processing challenge from other group members.  Therapeutic Goals: 1. Patient will demonstrate understanding of diagnosis as evidenced by identifying two or more symptoms of the disorder 2. Patient will be able to express two feelings regarding the diagnosis 3. Patient will demonstrate their ability to communicate their needs through discussion and/or role play  Summary of Patient Progress:  Invited, chose not to attend.    Therapeutic Modalities:  Cognitive Behavioral Therapy Brief Therapy Feelings Identification    [place] Clinical Social Worker

## 2018-09-30 NOTE — BHH Suicide Risk Assessment (Signed)
[place] INPATIENT:  Family/Significant Other Suicide Prevention Education  Suicide Prevention Education:  Contact Attempts: Jerrell Belfast, friend, [phone] been identified by the patient as the family member/significant other with whom the patient will be residing, and identified as the person(s) who will aid the patient in the event of a mental health crisis.  With written consent from the patient, two attempts were made to provide suicide prevention education, prior to and/or following the patient's discharge.  We were unsuccessful in providing suicide prevention education.  A suicide education pamphlet was given to the patient to share with family/significant other.  Date and time of first attempt:1320 Date and time of second attempt:  Joanne Chars, LCSW 09/30/2018, 1:22 PM

## 2018-09-30 NOTE — Progress Notes (Signed)
Patient self inventory- Patient slept fair last night, sleep medication was not requested. Appetite has been fair, concentration poor. Hopelessness and anxiety rated 8 and 10/10. Unable to rate depression. Denies SI HI AVH. Endorses physical problems such as lightheadedness, ajd endorses pain in her lower back as well as both feet. Patient's goal is "working on anxiety, being around people and noise bother me. I can't understand what people say when they talk, and that bothers me. Help me before I get out of control please!!!"  Patient is compliant with medications prescribed per provider. Safety is maintained with 15 minute checks as well as environmetal checks. Will continue to monitor.

## 2018-09-30 NOTE — Plan of Care (Signed)
  Problem: Education: Goal: Emotional status will improve Outcome: Progressing   Problem: Education: Goal: Mental status will improve Outcome: Progressing   Problem: Education: Goal: Verbalization of understanding the information provided will improve Outcome: Progressing

## 2018-09-30 NOTE — BHH Group Notes (Signed)
Pt attended spiritual care group on grief and loss facilitated by chaplain Jerene Pitch   Group goal of establishing open and affirming space for members to share loss and experience with grief, normalize grief experience and provide psycho social education and grief support.  Group opened with facilitated discussion and psycho-social ed around grief and loss.  Group noted loss in connection to relationships and in relation to self.  Group identifyed life patterns, circumstances, changes that precipitate grief responses.   Group engaged in reflection on Worden's four tasks of grief.  Provided support to one another in group context.    Group facilitation drew on Narrative and Adlerian framework.    Jerene Pitch,  MDiv, [facility]

## 2018-09-30 NOTE — BHH Suicide Risk Assessment (Signed)
[place] INPATIENT:  Family/Significant Other Suicide Prevention Education  Suicide Prevention Education:  Contact Attempts: Edythe Lynn, son, [phone], has been identified by the patient as the family member/significant other with whom the patient will be residing, and identified as the person(s) who will aid the patient in the event of a mental health crisis.  With written consent from the patient, two attempts were made to provide suicide prevention education, prior to and/or following the patient's discharge.  We were unsuccessful in providing suicide prevention education.  A suicide education pamphlet was given to the patient to share with family/significant other.  Date and time of first attempt:09/30/18, 1321 Date and time of second attempt:  Joanne Chars, LCSW 09/30/2018, 1:21 PM

## 2018-09-30 NOTE — BHH Group Notes (Signed)
Adult Psychoeducational Group Note  Date:  09/30/2018 Time:  9:14 AM  Group Topic/Focus:  Goals Group:   The focus of this group is to help patients establish daily goals to achieve during treatment and discuss how the patient can incorporate goal setting into their daily lives to aide in recovery.  Participation Level:  Active  Participation Quality:  Appropriate  Affect:  Appropriate  Cognitive:  Alert  Insight: Appropriate  Engagement in Group:  Engaged  Modes of Intervention:  Orientation  Additional Comments:   Pt attended and participated in orientation/goals group facilitated by MHT AJ.  Huel Cote 09/30/2018, 9:14 AM

## 2018-09-30 NOTE — Progress Notes (Signed)
Patient ID: Lauren Fuentes, female   DOB: 1966/07/06, 52 y.o.   MRN: [id number] PER STATE REGULATIONS 482.30  THIS CHART WAS REVIEWED FOR MEDICAL NECESSITY WITH RESPECT TO THE PATIENT'S ADMISSION/ DURATION OF STAY.  NEXT REVIEW DATE: 10/04/2018  Chauncy Lean, RN, BSN CASE MANAGER

## 2018-10-01 MED ORDER — CARIPRAZINE HCL 3 MG PO CAPS
6.0000 mg | ORAL_CAPSULE | Freq: Every day | ORAL | Status: DC
  Administered 2018-10-02 – 2018-10-03 (×2): 6 mg via ORAL
  Filled 2018-10-01 (×4): qty 2

## 2018-10-01 MED ORDER — DIVALPROEX SODIUM ER 500 MG PO TB24
1000.0000 mg | ORAL_TABLET | Freq: Every day | ORAL | Status: DC
  Administered 2018-10-02 – 2018-10-03 (×2): 1000 mg via ORAL
  Filled 2018-10-01 (×4): qty 2

## 2018-10-01 NOTE — Progress Notes (Signed)
[facility] MD Progress Note  10/01/2018 2:48 PM Veryl Abril  MRN:  [id number] Subjective: Patient is seen and examined.  Patient is a 52 year old female with a past psychiatric history significant for schizoaffective disorder; bipolar type.  She is seen in follow-up.  She is better today.  She is much less labile although she still remains quite pressured.  She denied any auditory hallucinations today.  The thought blocking has decreased.  She did have an episode of being enclosed in the elevator and got upset, but she recognizes now that it was not appropriate.  She is still hyper focused on the medications.  She called her local pharmacist to make sure about the medications.  Gust side effects and problems with the Lujean Amel as well as the Depakote.  Nursing notes reflect that she slept 6.75 hours last night.  She remains mildly tachycardic, but her blood pressure is stable.  Denied any suicidal or homicidal ideation Principal Problem: Schizoaffective disorder ([place]) Diagnosis:   Patient Active Problem List   Diagnosis Date Noted  . Chronic venous insufficiency [I87.2] 03/09/2016  . Hepatitis C [B19.20] 03/05/2016  . Varicose veins of lower extremities with complications [T55.732] 20/25/4270  . Chronic hepatitis C virus infection ([place]) [B18.2] 12/15/2015  . Allergic rhinitis [J30.9] 12/09/2015  . Insomnia [G47.00] 12/09/2015  . Genital herpes [A60.00] 12/09/2015  . Hyperlipidemia [E78.5] 12/09/2015  . Menopausal symptom [N95.1] 12/09/2015  . Compression fracture of body of thoracic vertebra ([place]) [S22.000A] 02/17/2015  . Lumbar compression fracture, L1-2 endplate  [W23.762G] 31/51/7616  . Thoracic compression fracture, T12 endplate  [W73.710G] 26/94/8546  . Fracture of metatarsal bone of right foot, 4th neck [S92.301A] 10/22/2014  . Has jumped from building [W13.9XXA] 10/22/2014  . Bipolar 1 disorder ([place]) [F31.9]   . Schizoaffective disorder ([place]) [F25.9]   . Calcaneal fracture, Left  [S92.009A]  10/19/2014   Total Time spent with patient: 15 minutes  Past Psychiatric History: See admission H&P  Past Medical History:  Past Medical History:  Diagnosis Date  . Arthritis   . Back pain, chronic   . Bipolar 1 disorder ([place])   . Calcaneal fracture, Left  10/19/2014  . Fracture of metatarsal bone of right foot, 4th neck 10/22/2014  . GERD (gastroesophageal reflux disease)   . Headache    hx migraines, tension headaches  . Hepatitis    stated in the past - pt denies this as of 07/28/15)  . Lumbar compression fracture, L1-2 endplate  27/02/5008  . Post traumatic stress disorder (PTSD)   . Schizoaffective disorder ([place])   . Thoracic compression fracture, T12 endplate  38/18/2993    Past Surgical History:  Procedure Laterality Date  . COLONOSCOPY    . HARDWARE REMOVAL Left 07/29/2015   Procedure: HARDWARE REMOVAL,LEFT HEEL;  Surgeon: Altamese Dunsmuir, MD;  Location: [place];  Service: Orthopedics;  Laterality: Left;  . HEMORRHOID SURGERY    . KYPHOPLASTY N/A 02/17/2015   Procedure: T12 KYPHOPLASTY;  Surgeon: Melina Schools, MD;  Location: [place];  Service: Orthopedics;  Laterality: N/A;  . ORIF CALCANEOUS FRACTURE Left 10/21/2014   Procedure: OPEN REDUCTION INTERNAL FIXATION (ORIF) LEFT CALCANEOUS FRACTURE;  Surgeon: Rozanna Box, MD;  Location: [facility];  Service: Orthopedics;  Laterality: Left;  . STERIOD INJECTION Right 07/29/2015   Procedure: Subtalar Joint injection with Fluoro guidance ;  Surgeon: Altamese Hasty, MD;  Location: [place];  Service: Orthopedics;  Laterality: Right;  . TUBAL LIGATION     Family History:  Family History  Problem  Relation Age of Onset  . Hypertension Mother   . Bipolar disorder Mother   . Hypertension Father   . Cancer Father    Family Psychiatric  History: See admission H&P Social History:  Social History   Substance and Sexual Activity  Alcohol Use Yes  . Alcohol/week: 0.0 standard drinks   Comment: rare     Social History   Substance and  Sexual Activity  Drug Use Yes  . Types: Cocaine   Comment: last use 09/2014     Social History   Socioeconomic History  . Marital status: Divorced    Spouse name: Not on file  . Number of children: Not on file  . Years of education: Not on file  . Highest education level: Not on file  Occupational History  . Not on file  Social Needs  . Financial resource strain: Not on file  . Food insecurity:    Worry: Not on file    Inability: Not on file  . Transportation needs:    Medical: Not on file    Non-medical: Not on file  Tobacco Use  . Smoking status: Current Some Day Smoker    Packs/day: 0.25    Years: 18.00    Pack years: 4.50    Types: Cigarettes    Last attempt to quit: 04/27/2015    Years since quitting: 3.4  . Smokeless tobacco: Never Used  Substance and Sexual Activity  . Alcohol use: Yes    Alcohol/week: 0.0 standard drinks    Comment: rare  . Drug use: Yes    Types: Cocaine    Comment: last use 09/2014   . Sexual activity: Not on file  Lifestyle  . Physical activity:    Days per week: Not on file    Minutes per session: Not on file  . Stress: Not on file  Relationships  . Social connections:    Talks on phone: Not on file    Gets together: Not on file    Attends religious service: Not on file    Active member of club or organization: Not on file    Attends meetings of clubs or organizations: Not on file    Relationship status: Not on file  Other Topics Concern  . Not on file  Social History Narrative   ** Merged History Encounter **       Additional Social History:                         Sleep: Fair  Appetite:  Good  Current Medications: Current Facility-Administered Medications  Medication Dose Route Frequency Provider Last Rate Last Dose  . acetaminophen (TYLENOL) tablet 650 mg  650 mg Oral Q6H PRN Money, Lowry Ram, FNP   650 mg at 09/28/18 2052  . albuterol (PROVENTIL HFA;VENTOLIN HFA) 108 (90 Base) MCG/ACT inhaler 2 puff  2 puff  Inhalation Q6H PRN Sharma Covert, MD   2 puff at 09/30/18 1133  . alum & mag hydroxide-simeth (MAALOX/MYLANTA) 200-200-20 MG/5ML suspension 30 mL  30 mL Oral Q4H PRN Money, Lowry Ram, FNP   30 mL at 09/30/18 2058  . cariprazine (VRAYLAR) capsule 4.5 mg  4.5 mg Oral Daily Sharma Covert, MD   4.5 mg at 10/01/18 2778  . divalproex (DEPAKOTE ER) 24 hr tablet 750 mg  750 mg Oral Daily Sharma Covert, MD   750 mg at 10/01/18 2423  . fluticasone (FLOVENT HFA) 44 MCG/ACT inhaler 2 puff  2 puff Inhalation BID Sharma Covert, MD   2 puff at 10/01/18 0825  . LORazepam (ATIVAN) tablet 1 mg  1 mg Oral Q6H PRN Money, Lowry Ram, FNP   1 mg at 09/30/18 2057   Or  . LORazepam (ATIVAN) injection 1 mg  1 mg Intramuscular Q6H PRN Money, Darnelle Maffucci B, FNP      . magnesium hydroxide (MILK OF MAGNESIA) suspension 30 mL  30 mL Oral Daily PRN Money, Darnelle Maffucci B, FNP      . nicotine (NICODERM CQ - dosed in mg/24 hours) patch 21 mg  21 mg Transdermal Daily Pennelope Bracken, MD   21 mg at 10/01/18 0160  . phenylephrine-shark liver oil-mineral oil-petrolatum (PREPARATION H) rectal ointment   Rectal BID PRN Derrill Center, NP        Lab Results: No results found for this or any previous visit (from the past 2 hour(s)).  Blood Alcohol level:  Lab Results  Component Value Date   ETH <10 09/26/2018   ETH <10 10/93/2355    Metabolic Disorder Labs: Lab Results  Component Value Date   HGBA1C 5.4 09/28/2018   MPG 108 09/28/2018   Lab Results  Component Value Date   PROLACTIN 24.6 (H) 09/28/2018   Lab Results  Component Value Date   CHOL 227 (H) 09/28/2018   TRIG 48 09/28/2018   HDL 90 09/28/2018   CHOLHDL 2.5 09/28/2018   VLDL 10 09/28/2018   LDLCALC 127 (H) 09/28/2018   LDLCALC 61 01/13/2016    Physical Findings: AIMS: Facial and Oral Movements Muscles of Facial Expression: None, normal Lips and Perioral Area: None, normal Jaw: None, normal Tongue: None, normal,Extremity  Movements Upper (arms, wrists, hands, fingers): None, normal Lower (legs, knees, ankles, toes): None, normal, Trunk Movements Neck, shoulders, hips: None, normal, Overall Severity Severity of abnormal movements (highest score from questions above): None, normal Incapacitation due to abnormal movements: None, normal Patient's awareness of abnormal movements (rate only patient's report): No Awareness, Dental Status Current problems with teeth and/or dentures?: No Does patient usually wear dentures?: No  CIWA:    COWS:     Musculoskeletal: Strength & Muscle Tone: within normal limits Gait & Station: normal Patient leans: N/A  Psychiatric Specialty Exam: Physical Exam  Nursing note and vitals reviewed. Constitutional: She is oriented to person, place, and time. She appears well-developed and well-nourished.  HENT:  Head: Normocephalic and atraumatic.  Respiratory: Effort normal.  Neurological: She is alert and oriented to person, place, and time.    ROS  Blood pressure 130/90, pulse (!) 104, temperature 98 F (36.7 C), temperature source Oral, resp. rate 18, height 5\' 4"  (1.626 m), weight 91.6 kg, last menstrual period 12/13/2015, SpO2 99 %.Body mass index is 34.67 kg/m.  General Appearance: Casual  Eye Contact:  Good  Speech:  Pressured  Volume:  Increased  Mood:  Anxious and Elevated  Affect:  Congruent  Thought Process:  Coherent and Descriptions of Associations: Tangential  Orientation:  Full (Time, Place, and Person)  Thought Content:  Paranoid Ideation  Suicidal Thoughts:  No  Homicidal Thoughts:  No  Memory:  Immediate;   Fair Recent;   Fair Remote;   Fair  Judgement:  Impaired  Insight:  Fair  Psychomotor Activity:  Increased  Concentration:  Concentration: Poor and Attention Span: Poor  Recall:  [employer] of Knowledge:  Fair  Language:  Good  Akathisia:  Negative  Handed:  Right  AIMS (if indicated):  Assets:  Desire for Improvement Housing Physical  Health Resilience  ADL's:  Intact  Cognition:  WNL  Sleep:  Number of Hours: 6.75     Treatment Plan Summary: Daily contact with patient to assess and evaluate symptoms and progress in treatment, Medication management and Plan : Patient is seen and examined.  Patient is a 52 year old female with a past psychiatric history as stated above.  #1 schizoaffective disorder; bipolar type-she is much less labile today, her psychosis is decreased, but she still is pressured and somewhat tangential.  I am going to increase her Vraylar to 6 mg p.o. daily starting tomorrow, and I am going to increase her Depakote ER to thousand milligrams p.o. nightly for mood stability.  She will continue to have lorazepam as needed for increased agitation.  #2 hypertension-mildly elevated but we will continue to monitor this.  #3 disposition-I will discuss with social work potentially getting her involved in a community service team or and ACTT service after discharge.  Sharma Covert, MD 10/01/2018, 2:48 PM

## 2018-10-01 NOTE — Progress Notes (Signed)
D: Patient denies SI, HI or AVH today. Patient presents as anxious and animated but cooperative. She states that she slept ok with the help of medication and has a good appetite.  Pt. Rates her concentration as poor, depression 0/10, anxiety 9/10 and hopelessness as 3/10.  Pt. States she is working on listening to the doctor about advice learned.   A: Patient given emotional support from RN. Patient encouraged to come to staff with concerns and/or questions. Patient's medication routine continued. Patient's orders and plan of care reviewed.   R: Patient remains appropriate and cooperative. Will continue to monitor patient q15 minutes for safety.

## 2018-10-01 NOTE — Progress Notes (Signed)
The patient shared with the group that she had a good talk with her doctor and with her peers as well. Her goal for tomorrow is to speak with her physician about getting discharged.

## 2018-10-01 NOTE — Progress Notes (Addendum)
Pt was observed in the dayroom, seen eating a snack. Pt appears labile/irritable in affect and mood.Pt denies SI/HI/Pain at this time. Endorses AVH stating she sees dead people and hear good and bad voices talking to her. Pt is preoccupied about how she got here and had a lot of questions regarding medications. Pt remains tangential in thought process.Support and encouragement offered. Will continue with POC.

## 2018-10-01 NOTE — Progress Notes (Signed)
Recreation Therapy Notes  Date: 10.9.19 Time: 1000 Location: 500 Hall Dayroom  Group Topic: Self-Esteem  Goal Area(s) Addresses:  Patient will successfully identify positive attributes about themselves.  Patient will successfully identify benefit of improved self-esteem.   Behavioral Response: Engaged  Intervention: Visual merchandiser, scissors, glue sticks, magazines, music  Activity: Collage.  Patients were to use the supplies provided to create a collage that highlighted the good things about them.  Patients could also focus on accomplishments or places they want go or activities they want to try in the future.  Education:  Self-Esteem, Dentist.   Education Outcome: Acknowledges education/In group clarification offered/Needs additional education  Clinical Observations/Feedback: Pt arrived before the end of group.  Pt shared with the group that she wants a big [place] house [place] with a private drive way for herself and her son with autism.  Pt also talked about how she loves flowers and that she prays a lot.    Victorino Sparrow, LRT/CTRS      Victorino Sparrow A 10/01/2018 11:48 AM

## 2018-10-01 NOTE — BHH Group Notes (Signed)
LCSW Group Therapy Note  10/01/2018 1:15pm  Type of Therapy/Topic:  Group Therapy:  Emotion Regulation  Participation Level:  Active   Description of Group:   The purpose of this group is to assist patients in learning to regulate negative emotions and experience positive emotions. Patients will be guided to discuss ways in which they have been vulnerable to their negative emotions. These vulnerabilities will be juxtaposed with experiences of positive emotions or situations, and patients will be challenged to use positive emotions to combat negative ones. Special emphasis will be placed on coping with negative emotions in conflict situations, and patients will process healthy conflict resolution skills.  Therapeutic Goals: 1. Patient will identify two positive emotions or experiences to reflect on in order to balance out negative emotions 2. Patient will label two or more emotions that they find the most difficult to experience 3. Patient will demonstrate positive conflict resolution skills through discussion and/or role plays  Summary of Patient Progress:  Adylin participated in the session outdoors. She was fixated on her anxiety from taking the elevator. She was persistent and intrusive attempting to apologize to staff. She described one way that she manages stress by petting her cat. She also stated she tries to walk away to calm down.       Therapeutic Modalities:   Cognitive Behavioral Therapy Feelings Identification Dialectical Behavioral Therapy   Trish Mage, LCSW 10/01/2018 10:19 AM

## 2018-10-02 NOTE — BHH Group Notes (Signed)
[facility] Group Notes:  (Nursing/MHT/Case Management/Adjunct)  Date:  10/02/2018  Time:  4:00 PM  Type of Therapy:  Nurse Education  Participation Level:  Did Not Attend  Baron Sane 10/02/2018, 4:32 PM

## 2018-10-02 NOTE — BHH Group Notes (Signed)
LCSW Group Therapy Note  10/02/2018 1:15pm  Type of Therapy/Topic:  Group Therapy:  Balance in Life  Participation Level:  Active  Description of Group:    This group will address the concept of balance and how it feels and looks when one is unbalanced. Patients will be encouraged to process areas in their lives that are out of balance and identify reasons for remaining unbalanced. Facilitators will guide patients in utilizing problem-solving interventions to address and correct the stressor making their life unbalanced. Understanding and applying boundaries will be explored and addressed for obtaining and maintaining a balanced life. Patients will be encouraged to explore ways to assertively make their unbalanced needs known to significant others in their lives, using other group members and facilitator for support and feedback.  Therapeutic Goals: 1. Patient will identify two or more emotions or situations they have that consume much of in their lives. 2. Patient will identify signs/triggers that life has become out of balance:  3. Patient will identify two ways to set boundaries in order to achieve balance in their lives:  4. Patient will demonstrate ability to communicate their needs through discussion and/or role plays  Summary of Patient Progress:  Stayed the entire time, engaged throughout.  Good focus, mood good.  No signs nor symptoms of psychosis.  Talked about her process of deciding to come here "out of desperation" and how she did not have a difficult time asking for help.    Therapeutic Modalities:   Cognitive Behavioral Therapy Solution-Focused Therapy Assertiveness Training  Trish Mage, [place] 10/02/2018 3:18 PM

## 2018-10-02 NOTE — Plan of Care (Signed)
Progress Note  D: pt found in the hallway; compliant with medication administration. Pt states she slept well. Pt rates her depression/hopelessness/anxiety a 0/0/8 out of 10 respectively. Pt has no physical complaints, and rates her pain at a 0/10. Pt states her goal for today is to work out how she got here. Pt will achieve this by listening and going to class. Pt denies any si/hi/ah/vh and verbally agrees to approach staff if these become apparent or before harming herself while at [facility]. A: pt provided support and encouragement. Pt given medications per protocol and standing orders. Q79m safety checks implemented and continued.  R: pt safe on the unit. Will continue to monitor.  Pt progressing in the following metrics  Problem: Health Behavior/Discharge Planning: Goal: Identification of resources available to assist in meeting health care needs will improve Outcome: Progressing Goal: Compliance with treatment plan for underlying cause of condition will improve Outcome: Progressing   Problem: Physical Regulation: Goal: Ability to maintain clinical measurements within normal limits will improve Outcome: Progressing   Problem: Safety: Goal: Periods of time without injury will increase Outcome: Progressing   Problem: Education: Goal: Verbalization of understanding the information provided will improve Outcome: Progressing   Problem: Health Behavior/Discharge Planning: Goal: Compliance with treatment plan for underlying cause of condition will improve Outcome: Progressing

## 2018-10-02 NOTE — Progress Notes (Signed)
Adult Psychoeducational Group Note  Date:  10/02/2018 Time:  8:32 PM  Group Topic/Focus:  Wrap-Up Group:   The focus of this group is to help patients review their daily goal of treatment and discuss progress on daily workbooks.  Participation Level:  Active  Participation Quality:  Appropriate  Affect:  Appropriate  Cognitive:  Alert  Insight: Appropriate  Engagement in Group:  Engaged  Modes of Intervention:  Discussion  Additional Comments:  Patient stated having a good day. Patient's goal for today was to talk to someone from the ACT program. Patient met goal.    L  10/02/2018, 8:32 PM 

## 2018-10-02 NOTE — Progress Notes (Signed)
Recreation Therapy Notes  Date: 10.10.19 Time: 1000 Location: 500 Hall Dayroom  Group Topic: Coping Skills  Goal Area(s) Addresses:  Patient will identify positive coping skills. Patient will identify benefits of coping skills.  Intervention: Worksheet  Activity: Radiographer, therapeutic.  Patients were given a worksheet to identify instances in which coping skills would be needed.  Patients were to then come up with coping skills to deal with that situation.  Education: Radiographer, therapeutic, Dentist.   Education Outcome: Acknowledges understanding/In group clarification offered/Needs additional education.   Clinical Observations/Feedback: Pt did not attend group.    Victorino Sparrow, LRT/CTRS         Ria Comment, Myleigh Amara A 10/02/2018 10:49 AM

## 2018-10-03 LAB — CBC WITH DIFFERENTIAL/PLATELET
Abs Immature Granulocytes: 0.13 10*3/uL — ABNORMAL HIGH (ref 0.00–0.07)
Basophils Absolute: 0.1 10*3/uL (ref 0.0–0.1)
Basophils Relative: 1 %
Eosinophils Absolute: 0.3 10*3/uL (ref 0.0–0.5)
Eosinophils Relative: 4 %
HCT: 42 % (ref 36.0–46.0)
Hemoglobin: 13.3 g/dL (ref 12.0–15.0)
Immature Granulocytes: 2 %
Lymphocytes Relative: 48 %
Lymphs Abs: 3.8 10*3/uL (ref 0.7–4.0)
MCH: 29.1 pg (ref 26.0–34.0)
MCHC: 31.7 g/dL (ref 30.0–36.0)
MCV: 91.9 fL (ref 80.0–100.0)
Monocytes Absolute: 0.8 10*3/uL (ref 0.1–1.0)
Monocytes Relative: 10 %
Neutro Abs: 2.7 10*3/uL (ref 1.7–7.7)
Neutrophils Relative %: 35 %
Platelets: 255 10*3/uL (ref 150–400)
RBC: 4.57 MIL/uL (ref 3.87–5.11)
RDW: 13.2 % (ref 11.5–15.5)
WBC: 7.8 10*3/uL (ref 4.0–10.5)
nRBC: 0 % (ref 0.0–0.2)

## 2018-10-03 LAB — VALPROIC ACID LEVEL: Valproic Acid Lvl: 59 ug/mL (ref 50.0–100.0)

## 2018-10-03 LAB — HEPATIC FUNCTION PANEL
ALT: 12 U/L (ref 0–44)
AST: 18 U/L (ref 15–41)
Albumin: 3.5 g/dL (ref 3.5–5.0)
Alkaline Phosphatase: 63 U/L (ref 38–126)
Bilirubin, Direct: <0.1 mg/dL (ref 0.0–0.2)
Total Bilirubin: 0.4 mg/dL (ref 0.3–1.2)
Total Protein: 6.8 g/dL (ref 6.5–8.1)

## 2018-10-03 MED ORDER — FLUTICASONE PROPIONATE HFA 44 MCG/ACT IN AERO: 2.0000 | INHALATION_SPRAY | Freq: Two times a day (BID) | RESPIRATORY_TRACT | 0 refills | Status: AC

## 2018-10-03 MED ORDER — ALBUTEROL SULFATE HFA 108 (90 BASE) MCG/ACT IN AERS: 2.0000 | INHALATION_SPRAY | Freq: Four times a day (QID) | RESPIRATORY_TRACT | 0 refills | Status: AC | PRN

## 2018-10-03 MED ORDER — DIVALPROEX SODIUM ER 500 MG PO TB24: 1000.0000 mg | ORAL_TABLET | Freq: Every day | ORAL | 0 refills | Status: AC

## 2018-10-03 MED ORDER — CARIPRAZINE HCL 6 MG PO CAPS: 6.0000 mg | ORAL_CAPSULE | Freq: Every day | ORAL | 0 refills | Status: AC

## 2018-10-03 NOTE — BHH Suicide Risk Assessment (Signed)
[facility] Discharge Suicide Risk Assessment   Principal Problem: Schizoaffective disorder [facility]) Discharge Diagnoses:  Patient Active Problem List   Diagnosis Date Noted  . Chronic venous insufficiency [I87.2] 03/09/2016  . Hepatitis C [B19.20] 03/05/2016  . Varicose veins of lower extremities with complications [E31.540] 08/67/6195  . Chronic hepatitis C virus infection ([place]) [B18.2] 12/15/2015  . Allergic rhinitis [J30.9] 12/09/2015  . Insomnia [G47.00] 12/09/2015  . Genital herpes [A60.00] 12/09/2015  . Hyperlipidemia [E78.5] 12/09/2015  . Menopausal symptom [N95.1] 12/09/2015  . Compression fracture of body of thoracic vertebra ([place]) [S22.000A] 02/17/2015  . Lumbar compression fracture, L1-2 endplate  [K93.267T] 24/58/0998  . Thoracic compression fracture, T12 endplate  [P38.250N] 39/76/7341  . Fracture of metatarsal bone of right foot, 4th neck [S92.301A] 10/22/2014  . Has jumped from building [W13.9XXA] 10/22/2014  . Bipolar 1 disorder ([place]) [F31.9]   . Schizoaffective disorder ([place]) [F25.9]   . Calcaneal fracture, Left  [S92.009A] 10/19/2014    Total Time spent with patient: 20 minutes  Musculoskeletal: Strength & Muscle Tone: within normal limits Gait & Station: normal Patient leans: N/A  Psychiatric Specialty Exam: Review of Systems  All other systems reviewed and are negative.   Blood pressure (!) 108/59, pulse (!) 103, temperature 97.9 F (36.6 C), temperature source Oral, resp. rate 18, height 5\' 4"  (1.626 m), weight 91.6 kg, last menstrual period 12/13/2015, SpO2 99 %.Body mass index is 34.67 kg/m.  General Appearance: Casual  Eye Contact::  Good  Speech:  Normal Rate409  Volume:  Normal  Mood:  Anxious  Affect:  Congruent  Thought Process:  Coherent and Descriptions of Associations: Intact  Orientation:  Full (Time, Place, and Person)  Thought Content:  Logical  Suicidal Thoughts:  No  Homicidal Thoughts:  No  Memory:  Immediate;   Fair Recent;    Fair Remote;   Fair  Judgement:  Intact  Insight:  Fair  Psychomotor Activity:  Increased  Concentration:  Fair  Recall:  [employer] of Knowledge:Fair  Language: Fair  Akathisia:  Negative  Handed:  Right  AIMS (if indicated):     Assets:  Communication Skills Desire for Improvement Financial Resources/Insurance Housing Physical Health Resilience Social Support  Sleep:  Number of Hours: 6.25  Cognition: WNL  ADL's:  Intact   Mental Status Per Nursing Assessment::   On Admission:  NA  Demographic Factors:  Caucasian, Low socioeconomic status and Living alone  Loss Factors: NA  Historical Factors: Impulsivity  Risk Reduction Factors:   Positive coping skills or problem solving skills  Continued Clinical Symptoms:  Bipolar Disorder:   Depressive phase  Cognitive Features That Contribute To Risk:  None    Suicide Risk:  Minimal: No identifiable suicidal ideation.  Patients presenting with no risk factors but with morbid ruminations; may be classified as minimal risk based on the severity of the depressive symptoms  Follow-up Information    Services, [facility] Follow up.   Contact information: [address] [place] [address] [phone]           Plan Of Care/Follow-up recommendations:  Activity:  ad lib  Sharma Covert, MD 10/03/2018, 9:46 AM

## 2018-10-03 NOTE — Plan of Care (Signed)
  Problem: Education: Goal: Knowledge of [facility] Education information/materials will improve Outcome: Completed/Met Goal: Emotional status will improve Outcome: Completed/Met Goal: Mental status will improve Outcome: Completed/Met Goal: Verbalization of understanding the information provided will improve Outcome: Completed/Met   Problem: Activity: Goal: Interest or engagement in activities will improve Outcome: Completed/Met Goal: Sleeping patterns will improve Outcome: Completed/Met   Problem: Coping: Goal: Ability to verbalize frustrations and anger appropriately will improve Outcome: Completed/Met Goal: Ability to demonstrate self-control will improve Outcome: Completed/Met   Problem: Health Behavior/Discharge Planning: Goal: Identification of resources available to assist in meeting health care needs will improve Outcome: Completed/Met Goal: Compliance with treatment plan for underlying cause of condition will improve Outcome: Completed/Met   Problem: Physical Regulation: Goal: Ability to maintain clinical measurements within normal limits will improve Outcome: Completed/Met   Problem: Safety: Goal: Periods of time without injury will increase Outcome: Completed/Met   Problem: Education: Goal: Knowledge of [facility] Education information/materials will improve Outcome: Completed/Met Goal: Emotional status will improve Outcome: Completed/Met Goal: Mental status will improve Outcome: Completed/Met Goal: Verbalization of understanding the information provided will improve Outcome: Completed/Met   Problem: Health Behavior/Discharge Planning: Goal: Identification of resources available to assist in meeting health care needs will improve Outcome: Completed/Met Goal: Compliance with treatment plan for underlying cause of condition will improve Outcome: Completed/Met   Problem: Coping: Goal: Ability to identify and develop effective coping  behavior will improve Outcome: Completed/Met   Problem: Coping: Goal: Ability to identify and develop effective coping behavior will improve Outcome: Completed/Met Goal: Ability to interact with others will improve Outcome: Completed/Met Goal: Demonstration of participation in decision-making regarding own care will improve Outcome: Completed/Met Goal: Ability to use eye contact when communicating with others will improve Outcome: Completed/Met   Problem: Health Behavior/Discharge Planning: Goal: Identification of resources available to assist in meeting health care needs will improve Outcome: Completed/Met

## 2018-10-03 NOTE — [facility] Suicide Risk Assessment (Signed)
[facility] INPATIENT:  Family/Significant Other Suicide Prevention Education  Suicide Prevention Education:  Contact Attempts: Edythe Lynn, son, [phone], has been identified by the patient as the family member/significant other with whom the patient will be residing, and identified as the person(s) who will aid the patient in the event of a mental health crisis.  With written consent from the patient, two attempts were made to provide suicide prevention education, prior to and/or following the patient's discharge.  We were unsuccessful in providing suicide prevention education.  A suicide education pamphlet was given to the patient to share with family/significant other.  Date and time of first attempt:09/30/18, 1321 Date and time of second attempt: 10/03/18, 11:45  Trish Mage, LCSW 10/03/2018, 11:42 AM

## 2018-10-03 NOTE — Plan of Care (Signed)
  Problem: Education: Goal: Emotional status will improve Outcome: Progressing Goal: Verbalization of understanding the information provided will improve Outcome: Progressing   

## 2018-10-03 NOTE — Progress Notes (Signed)
  [facility] Adult Case Management Discharge Plan :  Will you be returning to the same living situation after discharge:  Yes,  home At discharge, do you have transportation home?: Yes,  family Do you have the ability to pay for your medications: Yes,  MCR  Release of information consent forms completed and in the chart;  Patient's signature needed at discharge.  Patient to Follow up at: Follow-up Information    [facility] Follow up on 10/07/2018.   Why:  Hospital follow-up on Tuesday, 10/15 at 9:00AM. Please bring hospital discharge paperwork to this appt. Thank you.  Contact information: [address] [place] [address] [phone]           Next level of care provider has access to [place] and Suicide Prevention discussed: Yes,  yes  Have you used any form of tobacco in the last 30 days? (Cigarettes, Smokeless Tobacco, Cigars, and/or Pipes): Yes  Has patient been referred to the Quitline?: Patient refused referral  Patient has been referred for addiction treatment: [place], LCSW 10/03/2018, 11:43 AM

## 2018-10-03 NOTE — Progress Notes (Signed)
Recreation Therapy Notes  Date: 10.11.19 Time: 1000 Location: [address] Dayroom   Group Topic: Communication, Team Building, Problem Solving  Goal Area(s) Addresses:  Patient will effectively work with peer towards shared goal.  Patient will identify skills used to make activity successful.  Patient will identify how skills used during activity can be used to reach post d/c goals.   Behavioral Response: Engaged  Intervention: STEM Activity, Music  Activity: Geophysicist/field seismologist. In teams patients were given 12 plastic drinking straws and a length of masking tape. Using the materials provided patients were asked to build a landing pad to catch a golf ball dropped from approximately 6 feet in the air.   Education: Education officer, community, Discharge Planning   Education Outcome: Acknowledges education/In group clarification offered/Needs additional education.   Clinical Observations/Feedback: Pt arrived late but joined in with the group to assist them in building a landing pad.  Pt was bright, social and moved along to the music playing in the background.  Pt was appropriate and able to stay on task.     Lauren Fuentes, LRT/CTRS     Ria Comment, Lauren Fuentes A 10/03/2018 11:11 AM

## 2018-10-03 NOTE — [facility] Group Notes (Signed)
Group Therapy: Chaplain-Led Processing Group  Participation Level:  Active  Participation Quality:  Attentive  Affect:  Flat  Cognitive:  Oriented  Insight:  Limited  Engagement in Therapy:  Limited  Modes of Intervention:  Discussion, Socialization  Summary of Progress/Problems:  Chaplain was here to lead a group on themes of hope and courage.She attended the entire session. Rhonda shared that she loves nature "I have hummingbirds around my apartment, I love to watch". She also described feeling happy watching the water rushing over rocks brought her.   Lawana Pai MSW Intern 10/03/2018 1:16 PM

## 2018-10-03 NOTE — Progress Notes (Signed)
7p nursing discharge note  Pt discharged to family. Discharge education completed and prescriptions given. Pt verbalized understanding of all education provided and denied further need or concern.  Pt ambulated without issue to lobby greeted by son. All belongings returned and belongings sheet signed. No signs or symptoms of pain and distress noted.

## 2018-10-03 NOTE — Progress Notes (Signed)
Pt did attend group, and actively participated. 

## 2018-10-03 NOTE — Tx Team (Signed)
Interdisciplinary Treatment and Diagnostic Plan Update  10/03/2018 Time of Session:  Lauren Fuentes MRN: [id number]  Principal Diagnosis: Schizoaffective disorder [facility])  Secondary Diagnoses: Principal Problem:   Schizoaffective disorder ([place])   Current Medications:  Current Facility-Administered Medications  Medication Dose Route Frequency Provider Last Rate Last Dose  . acetaminophen (TYLENOL) tablet 650 mg  650 mg Oral Q6H PRN Money, Lowry Ram, FNP   650 mg at 09/28/18 2052  . albuterol (PROVENTIL HFA;VENTOLIN HFA) 108 (90 Base) MCG/ACT inhaler 2 puff  2 puff Inhalation Q6H PRN Sharma Covert, MD   2 puff at 10/02/18 1315  . alum & mag hydroxide-simeth (MAALOX/MYLANTA) 200-200-20 MG/5ML suspension 30 mL  30 mL Oral Q4H PRN Money, Darnelle Maffucci B, FNP   30 mL at 10/02/18 1702  . cariprazine (VRAYLAR) capsule 6 mg  6 mg Oral Daily Sharma Covert, MD   6 mg at 10/03/18 0731  . divalproex (DEPAKOTE ER) 24 hr tablet 1,000 mg  1,000 mg Oral Daily Sharma Covert, MD   1,000 mg at 10/03/18 0731  . fluticasone (FLOVENT HFA) 44 MCG/ACT inhaler 2 puff  2 puff Inhalation BID Sharma Covert, MD   2 puff at 10/03/18 0731  . LORazepam (ATIVAN) tablet 1 mg  1 mg Oral Q6H PRN Money, Lowry Ram, FNP   1 mg at 10/03/18 0731   Or  . LORazepam (ATIVAN) injection 1 mg  1 mg Intramuscular Q6H PRN Money, Darnelle Maffucci B, FNP      . magnesium hydroxide (MILK OF MAGNESIA) suspension 30 mL  30 mL Oral Daily PRN Money, Darnelle Maffucci B, FNP      . nicotine (NICODERM CQ - dosed in mg/24 hours) patch 21 mg  21 mg Transdermal Daily Pennelope Bracken, MD   21 mg at 10/03/18 0731  . phenylephrine-shark liver oil-mineral oil-petrolatum (PREPARATION H) rectal ointment   Rectal BID PRN Derrill Center, NP       PTA Medications: Medications Prior to Admission  Medication Sig Dispense Refill Last Dose  . albuterol (PROVENTIL HFA;VENTOLIN HFA) 108 (90 Base) MCG/ACT inhaler Inhale 1-2 puffs into the lungs every 6 (six) hours  as needed for wheezing or shortness of breath.   Past Week at Unknown time  . benztropine (COGENTIN) 1 MG tablet Take 1 mg by mouth 2 (two) times daily.  0   . budesonide-formoterol (SYMBICORT) 80-4.5 MCG/ACT inhaler Inhale 2 puffs into the lungs 2 (two) times daily.   09/21/2018  . busPIRone (BUSPAR) 10 MG tablet Take 10 mg by mouth 3 (three) times daily.    Past Week at Unknown time  . cariprazine (VRAYLAR) capsule Take 3 mg by mouth daily.     . divalproex (DEPAKOTE ER) 500 MG 24 hr tablet Take 500 mg by mouth at bedtime.     . divalproex (DEPAKOTE) 500 MG DR tablet Take 500 mg by mouth 3 (three) times daily.     Marland Kitchen doxepin (SINEQUAN) 50 MG capsule Take 100 mg by mouth at bedtime.     Marland Kitchen FLUoxetine (PROZAC) 20 MG capsule Take 20 mg by mouth every morning.    09/21/2018  . fluticasone (FLONASE) 50 MCG/ACT nasal spray Place 2 sprays into both nostrils daily. 16 g 6 09/24/2018  . hydrOXYzine (ATARAX/VISTARIL) 50 MG tablet Take 25 mg by mouth every 6 (six) hours as needed for anxiety.    09/21/2018  . lamoTRIgine (LAMICTAL) 100 MG tablet Take 100 mg by mouth 2 (two) times daily.   Past Week at  Unknown time  . lamoTRIgine (LAMICTAL) 150 MG tablet Take 150 mg by mouth 2 (two) times daily.     Marland Kitchen loratadine (CLARITIN) 10 MG tablet Take 10 mg by mouth daily.     Marland Kitchen loxapine (LOXITANE) 10 MG capsule Take 10 mg by mouth 3 (three) times daily.  3 09/21/2018  . montelukast (SINGULAIR) 10 MG tablet Take 10 mg by mouth daily. Daily at 9:00am   unknown  . ondansetron (ZOFRAN-ODT) 4 MG disintegrating tablet Take 4 mg by mouth every 8 (eight) hours as needed for nausea or vomiting.     Marland Kitchen perphenazine (TRILAFON) 4 MG tablet Take 4 mg by mouth 3 (three) times daily.     . prazosin (MINIPRESS) 1 MG capsule Take 1 mg by mouth at bedtime.     . traZODone (DESYREL) 150 MG tablet Take 300 mg by mouth at bedtime.   Past Week at Unknown time  . triprolidine-pseudoephedrine (APRODINE) 2.5-60 MG TABS tablet Take 1 tablet by mouth  every 4 (four) hours as needed for allergies or congestion.    Past Week at Unknown time  . valACYclovir (VALTREX) 500 MG tablet TAKE 1 TABLET BY MOUTH 2 TIMES DAILY. (Patient taking differently: Take 500 mg by mouth daily. ) 60 tablet 1 Past Week at Unknown time    Patient Stressors: Medication change or noncompliance Traumatic event  Patient Strengths: Capable of independent living Scientist, research (life sciences) Supportive family/friends  Treatment Modalities: Medication Management, Group therapy, Case management,  1 to 1 session with clinician, Psychoeducation, Recreational therapy.   Physician Treatment Plan for Primary Diagnosis: Schizoaffective disorder ([place]) Long Term Goal(s): Improvement in symptoms so as ready for discharge Improvement in symptoms so as ready for discharge   Short Term Goals: Ability to identify changes in lifestyle to reduce recurrence of condition will improve Ability to verbalize feelings will improve Ability to identify and develop effective coping behaviors will improve Ability to maintain clinical measurements within normal limits will improve Ability to identify changes in lifestyle to reduce recurrence of condition will improve Ability to demonstrate self-control will improve Ability to identify and develop effective coping behaviors will improve Ability to identify triggers associated with substance abuse/mental health issues will improve  Medication Management: Evaluate patient's response, side effects, and tolerance of medication regimen.  Therapeutic Interventions: 1 to 1 sessions, Unit Group sessions and Medication administration.  Evaluation of Outcomes: Adequate for Discharge  Physician Treatment Plan for Secondary Diagnosis: Principal Problem:   Schizoaffective disorder ([place])  Long Term Goal(s): Improvement in symptoms so as ready for discharge Improvement in symptoms so as ready for discharge   Short Term Goals: Ability to identify changes in  lifestyle to reduce recurrence of condition will improve Ability to verbalize feelings will improve Ability to identify and develop effective coping behaviors will improve Ability to maintain clinical measurements within normal limits will improve Ability to identify changes in lifestyle to reduce recurrence of condition will improve Ability to demonstrate self-control will improve Ability to identify and develop effective coping behaviors will improve Ability to identify triggers associated with substance abuse/mental health issues will improve     Medication Management: Evaluate patient's response, side effects, and tolerance of medication regimen.  Therapeutic Interventions: 1 to 1 sessions, Unit Group sessions and Medication administration.  Evaluation of Outcomes: Adequate for Discharge   RN Treatment Plan for Primary Diagnosis: Schizoaffective disorder [facility]) Long Term Goal(s): Knowledge of disease and therapeutic regimen to maintain health will improve  Short Term Goals: Ability to participate  in decision making will improve, Ability to verbalize feelings will improve, Ability to identify and develop effective coping behaviors will improve and Compliance with prescribed medications will improve  Medication Management: RN will administer medications as ordered by provider, will assess and evaluate patient's response and provide education to patient for prescribed medication. RN will report any adverse and/or side effects to prescribing provider.  Therapeutic Interventions: 1 on 1 counseling sessions, Psychoeducation, Medication administration, Evaluate responses to treatment, Monitor vital signs and CBGs as ordered, Perform/monitor CIWA, COWS, AIMS and Fall Risk screenings as ordered, Perform wound care treatments as ordered.  Evaluation of Outcomes: Adequate for Discharge   LCSW Treatment Plan for Primary Diagnosis: Schizoaffective disorder [facility]) Long Term Goal(s): Safe transition  to appropriate next level of care at discharge, Engage patient in therapeutic group addressing interpersonal concerns.  Short Term Goals: Engage patient in aftercare planning with referrals and resources  Therapeutic Interventions: Assess for all discharge needs, 1 to 1 time with Social worker, Explore available resources and support systems, Assess for adequacy in community support network, Educate family and significant other(s) on suicide prevention, Complete Psychosocial Assessment, Interpersonal group therapy.  Evaluation of Outcomes: Met  Return home and follow up [place] in Treatment: Attending groups: Yes. Participating in groups: Yes. Taking medication as prescribed: Yes. Toleration medication: Yes. Family/Significant other contact made: No, will contact:  if patient consents to collateral contacts  Patient understands diagnosis: Yes. Discussing patient identified problems/goals with staff: Yes. Medical problems stabilized or resolved: Yes. Denies suicidal/homicidal ideation: Yes. Issues/concerns per patient self-inventory: No. Other:   New problem(s) identified: None   New Short Term/Long Term Goal(s): medication stabilization, elimination of SI thoughts, development of comprehensive mental wellness plan.   Patient Goals:    Discharge Plan or Barriers: Patient currently sees Dr. Hoyle Barr at [facility] for outpatient medication management. Patient currently being referred to ACTT services.   Reason for Continuation of Hospitalization: Anxiety Delusions  Medication stabilization  Estimated Length of Stay:D/C today   Attendees: Patient: 10/03/2018 4:21 PM  Physician: Dr. Neita Garnet, MD 10/03/2018 4:21 PM  Nursing: Benjamine Mola.Jenetta Downer RN 10/03/2018 4:21 PM  RN Care Manager: Lars Pinks, RN 10/03/2018 4:21 PM  Social Worker: Roque Lias, [place] 10/03/2018 4:21 PM  Recreational Therapist: Rhunette Croft 10/03/2018 4:21 PM  Other: Ricky Ala, NP 10/03/2018 4:21 PM   Other:  10/03/2018 4:21 PM  Other: 10/03/2018 4:21 PM    Scribe for Treatment Team: Trish Mage, LCSW 10/03/2018 4:21 PM

## 2018-10-03 NOTE — Progress Notes (Signed)
D: Patient observed up and visible this evening. Attended wrap up group. Patient states, "I wish I could remember the details of when I came here. I'm glad I did. I needed help. I know that now. The hallucinations? They are all but gone." Patient's affect animated, mood anxious, pleasant. Denies pain, physical complaints.   A: Medicated per orders, prn ativan given for anxiety and to promote rest. Medication education provided. Level III obs in place for safety. Emotional support offered. Patient encouraged to attend and participate in unit programming.    R: Patient verbalizes understanding of POC. On reassess, patient was asleep. Patient denies SI/HI/AVH and remains safe on level III obs. Will continue to monitor throughout the night.
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
## 2018-10-17 DIAGNOSIS — F29 Unspecified psychosis not due to a substance or known physiological condition: Secondary | ICD-10-CM | POA: Diagnosis not present

## 2018-11-17 DIAGNOSIS — K210 Gastro-esophageal reflux disease with esophagitis: Secondary | ICD-10-CM | POA: Diagnosis not present

## 2018-11-17 DIAGNOSIS — R600 Localized edema: Secondary | ICD-10-CM | POA: Diagnosis not present

## 2018-11-17 DIAGNOSIS — F3160 Bipolar disorder, current episode mixed, unspecified: Secondary | ICD-10-CM | POA: Diagnosis not present

## 2018-11-18 ENCOUNTER — Ambulatory Visit: Admit: 2018-11-18 | Discharge: 2018-11-19 | Payer: MEDICARE

## 2018-11-18 DIAGNOSIS — F319 Bipolar disorder, unspecified: Secondary | ICD-10-CM | POA: Diagnosis not present

## 2018-11-18 DIAGNOSIS — A6004 Herpesviral vulvovaginitis: Secondary | ICD-10-CM | POA: Diagnosis not present

## 2018-11-18 DIAGNOSIS — Z01419 Encounter for gynecological examination (general) (routine) without abnormal findings: Secondary | ICD-10-CM | POA: Diagnosis not present

## 2019-02-03 ENCOUNTER — Ambulatory Visit: Admit: 2019-02-03 | Discharge: 2019-02-04 | Payer: MEDICARE | Attending: Family | Primary: Family

## 2019-02-03 DIAGNOSIS — B182 Chronic viral hepatitis C: Principal | ICD-10-CM

## 2019-03-04 DIAGNOSIS — Z8619 Personal history of other infectious and parasitic diseases: Principal | ICD-10-CM

## 2019-08-04 ENCOUNTER — Institutional Professional Consult (permissible substitution): Admit: 2019-08-04 | Discharge: 2019-08-05 | Payer: MEDICARE | Attending: Family | Primary: Family

## 2019-08-04 DIAGNOSIS — Z8619 Personal history of other infectious and parasitic diseases: Principal | ICD-10-CM

## 2019-08-10 ENCOUNTER — Ambulatory Visit: Admit: 2019-08-10 | Discharge: 2019-08-11 | Payer: MEDICARE

## 2019-08-10 DIAGNOSIS — Z8619 Personal history of other infectious and parasitic diseases: Principal | ICD-10-CM

## 2020-02-11 ENCOUNTER — Institutional Professional Consult (permissible substitution): Admit: 2020-02-11 | Discharge: 2020-02-12 | Payer: MEDICARE | Attending: Family | Primary: Family

## 2021-07-05 ENCOUNTER — Emergency Department (HOSPITAL_COMMUNITY): Payer: Medicare Other

## 2021-07-05 ENCOUNTER — Encounter (HOSPITAL_COMMUNITY): Payer: Self-pay | Admitting: *Deleted

## 2021-07-05 ENCOUNTER — Other Ambulatory Visit: Payer: Self-pay

## 2021-07-05 ENCOUNTER — Emergency Department (HOSPITAL_COMMUNITY)
Admission: EM | Admit: 2021-07-05 | Discharge: 2021-07-05 | Disposition: A | Discharge status: Home / Self Care | Payer: Medicare Other | Attending: Emergency Medicine | Admitting: Emergency Medicine

## 2021-07-05 DIAGNOSIS — R519 Headache, unspecified: Secondary | ICD-10-CM | POA: Diagnosis present

## 2021-07-05 DIAGNOSIS — E871 Hypo-osmolality and hyponatremia: Secondary | ICD-10-CM

## 2021-07-05 DIAGNOSIS — I959 Hypotension, unspecified: Secondary | ICD-10-CM | POA: Insufficient documentation

## 2021-07-05 DIAGNOSIS — U071 COVID-19: Secondary | ICD-10-CM | POA: Insufficient documentation

## 2021-07-05 DIAGNOSIS — R058 Other specified cough: Secondary | ICD-10-CM

## 2021-07-05 DIAGNOSIS — F17210 Nicotine dependence, cigarettes, uncomplicated: Secondary | ICD-10-CM | POA: Diagnosis not present

## 2021-07-05 DIAGNOSIS — R531 Weakness: Secondary | ICD-10-CM

## 2021-07-05 LAB — COMPREHENSIVE METABOLIC PANEL
ALT: 14 U/L (ref 0–44)
AST: 26 U/L (ref 15–41)
Albumin: 3.1 g/dL — ABNORMAL LOW (ref 3.5–5.0)
Alkaline Phosphatase: 33 U/L — ABNORMAL LOW (ref 38–126)
Anion gap: 5 (ref 5–15)
BUN: 10 mg/dL (ref 6–20)
CO2: 28 mmol/L (ref 22–32)
Calcium: 8.4 mg/dL — ABNORMAL LOW (ref 8.9–10.3)
Chloride: 97 mmol/L — ABNORMAL LOW (ref 98–111)
Creatinine, Ser: 1.1 mg/dL — ABNORMAL HIGH (ref 0.44–1.00)
GFR, Estimated: 59 mL/min — ABNORMAL LOW (ref >60)
Glucose, Bld: 98 mg/dL (ref 70–99)
Potassium: 4.2 mmol/L (ref 3.5–5.1)
Sodium: 130 mmol/L — ABNORMAL LOW (ref 135–145)
Total Bilirubin: 1 mg/dL (ref 0.3–1.2)
Total Protein: 5.9 g/dL — ABNORMAL LOW (ref 6.5–8.1)

## 2021-07-05 LAB — CBC
HCT: 37.6 % (ref 36.0–46.0)
Hemoglobin: 13.2 g/dL (ref 12.0–15.0)
MCH: 32 pg (ref 26.0–34.0)
MCHC: 35.1 g/dL (ref 30.0–36.0)
MCV: 91.3 fL (ref 80.0–100.0)
Platelets: 180 10*3/uL (ref 150–400)
RBC: 4.12 MIL/uL (ref 3.87–5.11)
RDW: 13 % (ref 11.5–15.5)
WBC: 5.5 10*3/uL (ref 4.0–10.5)
nRBC: 0 % (ref 0.0–0.2)

## 2021-07-05 LAB — LACTIC ACID, PLASMA: Lactic Acid, Venous: 1.2 mmol/L (ref 0.5–1.9)

## 2021-07-05 LAB — SARS CORONAVIRUS 2 (TAT 6-24 HRS): SARS Coronavirus 2: POSITIVE — AB

## 2021-07-05 MED ORDER — ACETAMINOPHEN 500 MG PO TABS
1000.0000 mg | ORAL_TABLET | Freq: Once | ORAL | Status: AC
  Administered 2021-07-05: 1000 mg via ORAL
  Filled 2021-07-05: qty 2

## 2021-07-05 MED ORDER — SODIUM CHLORIDE 0.9 % IV BOLUS
1000.0000 mL | Freq: Once | INTRAVENOUS | Status: AC
  Administered 2021-07-05: 1000 mL via INTRAVENOUS

## 2021-07-05 NOTE — Discharge Instructions (Addendum)
It was our pleasure to provide your ER care today - we hope that you feel better.  Drink plenty of fluids/make sure to stay well hydrated (avoid excessive water intake as sodium level is mildly low, instead use electrolyte solution/food, and make sure adequate salt in diet) . Get adequate nutrition.  Complete the course of your antibiotic as prescribed by your doctor.   Follow up with primary care doctor this week - call office in AM tomorrow to arrange follow up. Also have your sodium level rechecked then, as it is mildly low today.  Return to ER if worse, new symptoms, chest pain, trouble breathing, persistent vomiting, weak/fainting, or other emergency concern.

## 2021-07-05 NOTE — ED Triage Notes (Signed)
Headache for a year

## 2021-07-05 NOTE — ED Provider Notes (Signed)
[facility] EMERGENCY DEPARTMENT Provider Note   CSN: [id number] Arrival date & time: 07/05/21  1151     History Chief Complaint  Patient presents with   Headache   Hypotension    Lauren Fuentes is a 55 y.o. female.  Patient c/o productive cough, general weakness, and feeling as if wheezing/congested. Symptoms acute onset in past week, moderate, persistent, worsening - notes productive cough with yellowish/brown sputum, subjective fever, general weakness.  Denies sore throat or runny nose. Indicates headaches 'for a year', diffuse, dull, moderate. Pt difficult historian - level 5 caveat. Denies neck pain/stiffness. No chest pain. Mild sob w coughing and/or exertion. No abd pain or nvd. No dysuria or gu c/o. No leg pain or swelling. States recent saw pcp for same, was given rx augmentin, flonase, and prednisone. No known covid exposure.   The history is provided by the patient.  Headache Associated symptoms: cough   Associated symptoms: no abdominal pain, no back pain, no diarrhea, no neck pain, no neck stiffness, no sore throat and no vomiting       Past Medical History:  Diagnosis Date   Arthritis    Back pain, chronic    Bipolar 1 disorder ([place])    Calcaneal fracture, Left  10/19/2014   Fracture of metatarsal bone of right foot, 4th neck 10/22/2014   GERD (gastroesophageal reflux disease)    Headache    hx migraines, tension headaches   Hepatitis    stated in the past - pt denies this as of 07/28/15)   Lumbar compression fracture, L1-2 endplate  74/07/1447   Post traumatic stress disorder (PTSD)    Schizoaffective disorder ([place])    Thoracic compression fracture, T12 endplate  18/56/3149    Patient Active Problem List   Diagnosis Date Noted   Chronic venous insufficiency 03/09/2016   Hepatitis C 03/05/2016   Varicose veins of lower extremities with complications 70/26/3785   Chronic hepatitis C virus infection ([place]) 12/15/2015   Allergic rhinitis 12/09/2015   Insomnia  12/09/2015   Genital herpes 12/09/2015   Hyperlipidemia 12/09/2015   Menopausal symptom 12/09/2015   Compression fracture of body of thoracic vertebra (HCC) 02/17/2015   Lumbar compression fracture, L1-2 endplate  88/50/2774   Thoracic compression fracture, T12 endplate  12/87/8676   Fracture of metatarsal bone of right foot, 4th neck 10/22/2014   Has jumped from building 10/22/2014   Bipolar 1 disorder ([place])    Schizoaffective disorder ([place])    Calcaneal fracture, Left  10/19/2014    Past Surgical History:  Procedure Laterality Date   COLONOSCOPY     HARDWARE REMOVAL Left 07/29/2015   Procedure: HARDWARE REMOVAL,LEFT HEEL;  Surgeon: Altamese York, MD;  Location: [place];  Service: Orthopedics;  Laterality: Left;   HEMORRHOID SURGERY     KYPHOPLASTY N/A 02/17/2015   Procedure: T12 KYPHOPLASTY;  Surgeon: Melina Schools, MD;  Location: [place];  Service: Orthopedics;  Laterality: N/A;   ORIF CALCANEOUS FRACTURE Left 10/21/2014   Procedure: OPEN REDUCTION INTERNAL FIXATION (ORIF) LEFT CALCANEOUS FRACTURE;  Surgeon: Rozanna Box, MD;  Location: [place];  Service: Orthopedics;  Laterality: Left;   STERIOD INJECTION Right 07/29/2015   Procedure: Subtalar Joint injection with Fluoro guidance ;  Surgeon: Altamese [place], MD;  Location: [place];  Service: Orthopedics;  Laterality: Right;   TUBAL LIGATION       OB History   No obstetric history on file.     Family History  Problem Relation Age of Onset   Hypertension  Mother    Bipolar disorder Mother    Hypertension Father    Cancer Father     Social History   Tobacco Use   Smoking status: Some Days    Packs/day: 0.25    Years: 18.00    Pack years: 4.50    Types: Cigarettes    Last attempt to quit: 04/27/2015    Years since quitting: 6.1   Smokeless tobacco: Never  Substance Use Topics   Alcohol use: Yes    Alcohol/week: 0.0 standard drinks    Comment: rare   Drug use: Yes    Types: Cocaine    Comment: last use 09/2014     Home  Medications Prior to Admission medications   Medication Sig Start Date End Date Taking? Authorizing Provider  albuterol (PROVENTIL HFA;VENTOLIN HFA) 108 (90 Base) MCG/ACT inhaler Inhale 2 puffs into the lungs every 6 (six) hours as needed for wheezing or shortness of breath. 10/03/18   Money, Lowry Ram, FNP  cariprazine 6 MG CAPS Take 1 capsule (6 mg total) by mouth daily. For mood control 10/04/18   Money, Lowry Ram, FNP  divalproex (DEPAKOTE ER) 500 MG 24 hr tablet Take 2 tablets (1,000 mg total) by mouth daily. For mood control 10/04/18   Money, Lowry Ram, FNP  fluticasone (FLOVENT HFA) 44 MCG/ACT inhaler Inhale 2 puffs into the lungs 2 (two) times daily. 10/03/18   Money, Lowry Ram, FNP    Allergies    Patient has no known allergies.  Review of Systems   Review of Systems  Constitutional:  Negative for chills.  HENT:  Negative for sore throat.   Eyes:  Negative for redness.  Respiratory:  Positive for cough and wheezing.   Cardiovascular:  Negative for chest pain and leg swelling.  Gastrointestinal:  Negative for abdominal pain, blood in stool, diarrhea and vomiting.  Genitourinary:  Negative for dysuria and flank pain.  Musculoskeletal:  Negative for back pain, neck pain and neck stiffness.  Skin:  Negative for rash.  Neurological:  Positive for headaches.  Hematological:  Does not bruise/bleed easily.  Psychiatric/Behavioral:  Negative for dysphoric mood.    Physical Exam Updated Vital Signs BP (!) 85/67 (BP Location: Right Arm)   Pulse 81   Temp 98 F (36.7 C) (Oral)   Resp 18   LMP 12/13/2015 (Approximate)   SpO2 94%   Physical Exam Vitals and nursing note reviewed.  Constitutional:      Appearance: Normal appearance. She is well-developed.  HENT:     Head: Atraumatic.     Comments: No sinus or temporal tenderness.     Nose: Nose normal.     Mouth/Throat:     Mouth: Mucous membranes are moist.     Pharynx: Oropharynx is clear.  Eyes:     General: No scleral  icterus.    Conjunctiva/sclera: Conjunctivae normal.     Pupils: Pupils are equal, round, and reactive to light.  Neck:     Trachea: No tracheal deviation.     Comments: No stiffness or rigidity.  Cardiovascular:     Rate and Rhythm: Normal rate and regular rhythm.     Pulses: Normal pulses.     Heart sounds: Normal heart sounds. No murmur heard.   No friction rub. No gallop.  Pulmonary:     Effort: Pulmonary effort is normal. No respiratory distress.     Breath sounds: Normal breath sounds.  Abdominal:     General: Bowel sounds are normal. There is no  distension.     Palpations: Abdomen is soft.     Tenderness: There is no abdominal tenderness. There is no guarding.  Genitourinary:    Comments: No cva tenderness.  Musculoskeletal:        General: No swelling or tenderness.     Cervical back: Normal range of motion and neck supple. No rigidity. No muscular tenderness.     Right lower leg: No edema.     Left lower leg: No edema.  Skin:    General: Skin is warm and dry.     Findings: No rash.  Neurological:     Mental Status: She is alert.     Comments: Alert, speech normal. Motor/sens grossly intact bil.   Psychiatric:        Mood and Affect: Mood normal.    ED Results / Procedures / Treatments   Labs (all labs ordered are listed, but only abnormal results are displayed) Results for orders placed or performed during the hospital encounter of 07/05/21  Blood culture (routine x 2)   Specimen: BLOOD  Result Value Ref Range   Specimen Description BLOOD LEFT HAND    Special Requests      BOTTLES DRAWN AEROBIC AND ANAEROBIC Blood Culture adequate volume Performed at [facility], [address] [place], [place] [address]    Culture PENDING    Report Status PENDING   Blood culture (routine x 2)   Specimen: BLOOD  Result Value Ref Range   Specimen Description BLOOD RIGHT ANTECUBITAL    Special Requests      BOTTLES DRAWN AEROBIC AND ANAEROBIC Blood Culture adequate  volume Performed at [facility], [address], [place], [place] [address]    Culture PENDING    Report Status PENDING   CBC  Result Value Ref Range   WBC 5.5 4.0 - 10.5 K/uL   RBC 4.12 3.87 - 5.11 MIL/uL   Hemoglobin 13.2 12.0 - 15.0 g/dL   HCT 37.6 36.0 - 46.0 %   MCV 91.3 80.0 - 100.0 fL   MCH 32.0 26.0 - 34.0 pg   MCHC 35.1 30.0 - 36.0 g/dL   RDW 13.0 11.5 - 15.5 %   Platelets 180 150 - 400 K/uL   nRBC 0.0 0.0 - 0.2 %  Comprehensive metabolic panel  Result Value Ref Range   Sodium 130 (L) 135 - 145 mmol/L   Potassium 4.2 3.5 - 5.1 mmol/L   Chloride 97 (L) 98 - 111 mmol/L   CO2 28 22 - 32 mmol/L   Glucose, Bld 98 70 - 99 mg/dL   BUN 10 6 - 20 mg/dL   Creatinine, Ser 1.10 (H) 0.44 - 1.00 mg/dL   Calcium 8.4 (L) 8.9 - 10.3 mg/dL   Total Protein 5.9 (L) 6.5 - 8.1 g/dL   Albumin 3.1 (L) 3.5 - 5.0 g/dL   AST 26 15 - 41 U/L   ALT 14 0 - 44 U/L   Alkaline Phosphatase 33 (L) 38 - 126 U/L   Total Bilirubin 1.0 0.3 - 1.2 mg/dL   GFR, Estimated 59 (L) >60 mL/min   Anion gap 5 5 - 15  Lactic acid, plasma  Result Value Ref Range   Lactic Acid, Venous 1.2 0.5 - 1.9 mmol/L    EKG None  Radiology CT Head Wo Contrast  Result Date: 07/05/2021 CLINICAL DATA:  Mental status change EXAM: CT HEAD WITHOUT CONTRAST TECHNIQUE: Contiguous axial images were obtained from the base of the skull through the vertex without intravenous  contrast. COMPARISON:  08/16/2012 FINDINGS: Brain: No evidence of acute infarction, hemorrhage, extra-axial collection, ventriculomegaly, or mass effect. Generalized cerebral atrophy. Periventricular white matter low attenuation likely secondary to microangiopathy. Vascular: Cerebrovascular atherosclerotic calcifications are noted. Skull: Negative for fracture or focal lesion. Sinuses/Orbits: Visualized portions of the orbits are unremarkable. Small mucous retention cyst in the left maxillary sinus. Small air-fluid level in the sphenoid sinus. Mucosal thickening in  the left ethmoid sinus. Visualized portions of the mastoid air cells are unremarkable. Other: None. IMPRESSION: 1. No acute intracranial pathology. 2. Chronic microvascular disease and cerebral atrophy. Electronically Signed   By: Kathreen Devoid   On: 07/05/2021 13:44   DG Chest Port 1 View  Result Date: 07/05/2021 CLINICAL DATA:  Cough today, headache for 1 year, history smoker EXAM: PORTABLE CHEST 1 VIEW COMPARISON:  Portable exam 7628 hours compared to 08/16/2012 FINDINGS: Normal heart size, mediastinal contours, and pulmonary vascularity. Minimal subsegmental atelectasis at RIGHT base. Lungs otherwise clear. No pulmonary infiltrate, pleural effusion, or pneumothorax. Bones demineralized. IMPRESSION: Minimal RIGHT basilar atelectasis. Electronically Signed   By: Lavonia Dana M.D.   On: 07/05/2021 13:01    Procedures Procedures   Medications Ordered in ED Medications  sodium chloride 0.9 % bolus 1,000 mL (has no administration in time range)    ED Course  I have reviewed the triage vital signs and the nursing notes.  Pertinent labs & imaging results that were available during my care of the patient were reviewed by me and considered in my medical decision making (see chart for details).    MDM Rules/Calculators/A&P                         Iv ns bolus. Continuous pulse ox and cardiac monitoring. Ecg. Labs. Cultures.   Reviewed nursing notes and prior charts for additional history.   Labs reviewed/interpreted by me - wbc normal. Hgb normal.   CXR reviewed/interpreted by me - no pna.   CT reviewed/interpreted by me - no hem.   After ns bolus, vitals/bp normal.   Additional labs reviewed/interpreted by me  - lactate normal.   Po fluids/food.   Pt feels improved.   Vitals normal.  Pt currently appears stable for d/c.      Final Clinical Impression(s) / ED Diagnoses Final diagnoses:  None    Rx / DC Orders ED Discharge Orders     None        Lajean Saver,  MD 07/05/21 [phone]

## 2021-07-10 LAB — CULTURE, BLOOD (ROUTINE X 2)
Culture: NO GROWTH
Culture: NO GROWTH
Special Requests: ADEQUATE
Special Requests: ADEQUATE

## 2021-11-29 DIAGNOSIS — Z1231 Encounter for screening mammogram for malignant neoplasm of breast: Principal | ICD-10-CM

## 2022-02-19 ENCOUNTER — Ambulatory Visit: Admit: 2022-02-19 | Discharge: 2022-02-19 | Payer: MEDICARE

## 2022-07-09 ENCOUNTER — Ambulatory Visit: Admit: 2022-07-09 | Discharge: 2022-07-10 | Payer: MEDICARE

## 2022-07-09 DIAGNOSIS — Z01419 Encounter for gynecological examination (general) (routine) without abnormal findings: Principal | ICD-10-CM

## 2022-07-09 DIAGNOSIS — Z1211 Encounter for screening for malignant neoplasm of colon: Principal | ICD-10-CM

## 2024-12-03 ENCOUNTER — Inpatient Hospital Stay
Admission: RE | Admit: 2024-12-03 | Discharge: 2024-12-03 | Disposition: A | Discharge status: Home / Self Care | Payer: Self-pay | Source: Ambulatory Visit | Attending: Internal Medicine | Admitting: Internal Medicine

## 2024-12-03 ENCOUNTER — Other Ambulatory Visit (HOSPITAL_COMMUNITY): Payer: Self-pay | Admitting: Internal Medicine

## 2024-12-03 DIAGNOSIS — R928 Other abnormal and inconclusive findings on diagnostic imaging of breast: Secondary | ICD-10-CM

## 2024-12-04 ENCOUNTER — Other Ambulatory Visit (HOSPITAL_COMMUNITY): Payer: Self-pay | Admitting: Internal Medicine

## 2024-12-04 ENCOUNTER — Inpatient Hospital Stay
Admission: RE | Admit: 2024-12-04 | Discharge: 2024-12-04 | Disposition: A | Payer: Self-pay | Source: Ambulatory Visit | Attending: Internal Medicine | Admitting: Internal Medicine

## 2024-12-04 DIAGNOSIS — R928 Other abnormal and inconclusive findings on diagnostic imaging of breast: Secondary | ICD-10-CM

## 2024-12-08 ENCOUNTER — Other Ambulatory Visit (HOSPITAL_COMMUNITY): Payer: Self-pay | Admitting: Internal Medicine

## 2024-12-08 DIAGNOSIS — N63 Unspecified lump in unspecified breast: Secondary | ICD-10-CM

## 2025-01-19 ENCOUNTER — Ambulatory Visit (HOSPITAL_COMMUNITY)

## 2025-01-19 ENCOUNTER — Inpatient Hospital Stay (HOSPITAL_COMMUNITY): Admission: RE | Admit: 2025-01-19 | Source: Ambulatory Visit

## 2025-02-02 ENCOUNTER — Encounter (HOSPITAL_COMMUNITY)

## 2025-02-02 ENCOUNTER — Ambulatory Visit (HOSPITAL_COMMUNITY)
